# Patient Record
Sex: Male | Born: 1955 | Race: White | Hispanic: No | Marital: Married | Smoking: Former smoker
Health system: Southern US, Community
[De-identification: ages and names within clinical notes are randomized; demographics above are authoritative.]

## PROBLEM LIST (undated history)

## (undated) DIAGNOSIS — Z950 Presence of cardiac pacemaker: Secondary | ICD-10-CM

## (undated) DIAGNOSIS — I714 Abdominal aortic aneurysm, without rupture, unspecified: Secondary | ICD-10-CM

## (undated) DIAGNOSIS — E785 Hyperlipidemia, unspecified: Secondary | ICD-10-CM

## (undated) DIAGNOSIS — Z95810 Presence of automatic (implantable) cardiac defibrillator: Secondary | ICD-10-CM

## (undated) DIAGNOSIS — Q2112 Patent foramen ovale: Secondary | ICD-10-CM

## (undated) DIAGNOSIS — I10 Essential (primary) hypertension: Secondary | ICD-10-CM

## (undated) DIAGNOSIS — I2510 Atherosclerotic heart disease of native coronary artery without angina pectoris: Secondary | ICD-10-CM

## (undated) DIAGNOSIS — G4730 Sleep apnea, unspecified: Secondary | ICD-10-CM

## (undated) DIAGNOSIS — Z951 Presence of aortocoronary bypass graft: Secondary | ICD-10-CM

## (undated) DIAGNOSIS — I509 Heart failure, unspecified: Secondary | ICD-10-CM

## (undated) DIAGNOSIS — I739 Peripheral vascular disease, unspecified: Secondary | ICD-10-CM

## (undated) DIAGNOSIS — I219 Acute myocardial infarction, unspecified: Secondary | ICD-10-CM

## (undated) DIAGNOSIS — Q211 Atrial septal defect: Secondary | ICD-10-CM

## (undated) DIAGNOSIS — I639 Cerebral infarction, unspecified: Secondary | ICD-10-CM

## (undated) DIAGNOSIS — Z9049 Acquired absence of other specified parts of digestive tract: Secondary | ICD-10-CM

## (undated) DIAGNOSIS — K219 Gastro-esophageal reflux disease without esophagitis: Secondary | ICD-10-CM

## (undated) DIAGNOSIS — J189 Pneumonia, unspecified organism: Secondary | ICD-10-CM

## (undated) DIAGNOSIS — G709 Myoneural disorder, unspecified: Secondary | ICD-10-CM

## (undated) HISTORY — PX: CARDIAC DEFIBRILLATOR PLACEMENT: SHX171

## (undated) HISTORY — PX: ANKLE SURGERY: SHX546

## (undated) HISTORY — PX: FRACTURE SURGERY: SHX138

## (undated) HISTORY — DX: Atherosclerotic heart disease of native coronary artery without angina pectoris: I25.10

## (undated) HISTORY — DX: Acquired absence of other specified parts of digestive tract: Z90.49

## (undated) HISTORY — PX: CORONARY ANGIOPLASTY: SHX604

## (undated) HISTORY — PX: CARDIAC CATHETERIZATION: SHX172

## (undated) HISTORY — PX: HERNIA REPAIR: SHX51

## (undated) HISTORY — DX: Abdominal aortic aneurysm, without rupture: I71.4

## (undated) HISTORY — PX: APPENDECTOMY: SHX54

## (undated) HISTORY — DX: Peripheral vascular disease, unspecified: I73.9

## (undated) HISTORY — DX: Patent foramen ovale: Q21.12

## (undated) HISTORY — DX: Abdominal aortic aneurysm, without rupture, unspecified: I71.40

## (undated) HISTORY — DX: Gastro-esophageal reflux disease without esophagitis: K21.9

## (undated) HISTORY — PX: PTCA: SHX146

## (undated) HISTORY — PX: INSERT / REPLACE / REMOVE PACEMAKER: SUR710

## (undated) HISTORY — DX: Hyperlipidemia, unspecified: E78.5

## (undated) HISTORY — DX: Presence of aortocoronary bypass graft: Z95.1

## (undated) HISTORY — DX: Atrial septal defect: Q21.1

## (undated) HISTORY — DX: Essential (primary) hypertension: I10

---

## 1997-08-04 ENCOUNTER — Encounter: Admission: RE | Admit: 1997-08-04 | Discharge: 1997-08-04 | Payer: Self-pay | Admitting: *Deleted

## 1997-08-04 ENCOUNTER — Emergency Department (HOSPITAL_COMMUNITY): Admission: EM | Admit: 1997-08-04 | Discharge: 1997-08-04 | Payer: Self-pay | Admitting: Internal Medicine

## 1997-08-07 ENCOUNTER — Encounter: Admission: RE | Admit: 1997-08-07 | Discharge: 1997-11-05 | Payer: Self-pay | Admitting: Internal Medicine

## 1998-04-05 ENCOUNTER — Encounter: Payer: Self-pay | Admitting: Emergency Medicine

## 1998-04-05 ENCOUNTER — Inpatient Hospital Stay (HOSPITAL_COMMUNITY): Admission: EM | Admit: 1998-04-05 | Discharge: 1998-04-06 | Payer: Self-pay | Admitting: Emergency Medicine

## 1999-04-23 ENCOUNTER — Encounter: Payer: Self-pay | Admitting: Emergency Medicine

## 1999-04-23 ENCOUNTER — Inpatient Hospital Stay (HOSPITAL_COMMUNITY): Admission: EM | Admit: 1999-04-23 | Discharge: 1999-04-24 | Payer: Self-pay | Admitting: Emergency Medicine

## 1999-09-03 ENCOUNTER — Ambulatory Visit (HOSPITAL_BASED_OUTPATIENT_CLINIC_OR_DEPARTMENT_OTHER): Admission: RE | Admit: 1999-09-03 | Discharge: 1999-09-03 | Payer: Self-pay | Admitting: Pulmonary Disease

## 1999-12-17 ENCOUNTER — Encounter: Payer: Self-pay | Admitting: Occupational Medicine

## 1999-12-17 ENCOUNTER — Encounter: Admission: RE | Admit: 1999-12-17 | Discharge: 1999-12-17 | Payer: Self-pay | Admitting: Internal Medicine

## 2000-07-24 ENCOUNTER — Ambulatory Visit (HOSPITAL_COMMUNITY): Admission: RE | Admit: 2000-07-24 | Discharge: 2000-07-25 | Payer: Self-pay | Admitting: Cardiology

## 2001-05-12 ENCOUNTER — Encounter: Admission: RE | Admit: 2001-05-12 | Discharge: 2001-05-12 | Payer: Self-pay | Admitting: Occupational Medicine

## 2001-05-12 ENCOUNTER — Encounter: Payer: Self-pay | Admitting: Occupational Medicine

## 2002-01-30 ENCOUNTER — Emergency Department (HOSPITAL_COMMUNITY): Admission: EM | Admit: 2002-01-30 | Discharge: 2002-01-30 | Payer: Self-pay | Admitting: Emergency Medicine

## 2002-11-23 ENCOUNTER — Encounter (INDEPENDENT_AMBULATORY_CARE_PROVIDER_SITE_OTHER): Payer: Self-pay | Admitting: Specialist

## 2002-11-23 ENCOUNTER — Encounter: Payer: Self-pay | Admitting: Surgery

## 2002-11-23 ENCOUNTER — Inpatient Hospital Stay (HOSPITAL_COMMUNITY): Admission: EM | Admit: 2002-11-23 | Discharge: 2002-11-25 | Payer: Self-pay | Admitting: Surgery

## 2002-11-24 ENCOUNTER — Encounter: Payer: Self-pay | Admitting: Surgery

## 2002-11-24 ENCOUNTER — Encounter: Payer: Self-pay | Admitting: Internal Medicine

## 2002-12-02 ENCOUNTER — Encounter: Admission: RE | Admit: 2002-12-02 | Discharge: 2002-12-02 | Payer: Self-pay | Admitting: Surgery

## 2002-12-02 ENCOUNTER — Encounter: Payer: Self-pay | Admitting: Surgery

## 2002-12-02 ENCOUNTER — Inpatient Hospital Stay (HOSPITAL_COMMUNITY): Admission: EM | Admit: 2002-12-02 | Discharge: 2002-12-08 | Payer: Self-pay | Admitting: Surgery

## 2002-12-02 HISTORY — PX: CHOLECYSTECTOMY: SHX55

## 2002-12-05 ENCOUNTER — Encounter: Payer: Self-pay | Admitting: Surgery

## 2006-03-12 ENCOUNTER — Emergency Department (HOSPITAL_COMMUNITY): Admission: EM | Admit: 2006-03-12 | Discharge: 2006-03-12 | Payer: Self-pay | Admitting: Emergency Medicine

## 2006-03-14 ENCOUNTER — Inpatient Hospital Stay (HOSPITAL_COMMUNITY): Admission: EM | Admit: 2006-03-14 | Discharge: 2006-03-17 | Payer: Self-pay | Admitting: Emergency Medicine

## 2006-03-19 ENCOUNTER — Ambulatory Visit: Payer: Self-pay | Admitting: Gastroenterology

## 2007-11-09 ENCOUNTER — Inpatient Hospital Stay (HOSPITAL_COMMUNITY): Admission: AD | Admit: 2007-11-09 | Discharge: 2007-11-11 | Payer: Self-pay | Admitting: Orthopedic Surgery

## 2007-11-09 ENCOUNTER — Encounter (INDEPENDENT_AMBULATORY_CARE_PROVIDER_SITE_OTHER): Payer: Self-pay | Admitting: Orthopedic Surgery

## 2008-01-26 ENCOUNTER — Ambulatory Visit: Payer: Self-pay | Admitting: Cardiology

## 2008-02-03 ENCOUNTER — Ambulatory Visit: Payer: Self-pay | Admitting: Cardiovascular Disease

## 2008-02-04 ENCOUNTER — Inpatient Hospital Stay (HOSPITAL_COMMUNITY): Admission: EM | Admit: 2008-02-04 | Discharge: 2008-02-05 | Payer: Self-pay | Admitting: *Deleted

## 2008-02-04 ENCOUNTER — Encounter (INDEPENDENT_AMBULATORY_CARE_PROVIDER_SITE_OTHER): Payer: Self-pay | Admitting: Neurology

## 2008-02-08 ENCOUNTER — Ambulatory Visit: Payer: Self-pay | Admitting: Cardiology

## 2008-02-08 ENCOUNTER — Ambulatory Visit: Payer: Self-pay

## 2008-02-08 LAB — CONVERTED CEMR LAB
Alkaline Phosphatase: 73 units/L (ref 39–117)
CO2: 28 meq/L (ref 19–32)
Calcium: 9 mg/dL (ref 8.4–10.5)
GFR calc Af Amer: 102 mL/min
GFR calc non Af Amer: 84 mL/min
Potassium: 4.5 meq/L (ref 3.5–5.1)
Total Bilirubin: 0.7 mg/dL (ref 0.3–1.2)
Total Protein: 6.4 g/dL (ref 6.0–8.3)

## 2008-03-30 ENCOUNTER — Ambulatory Visit: Payer: Self-pay | Admitting: Cardiology

## 2008-03-30 LAB — CONVERTED CEMR LAB
Alkaline Phosphatase: 62 units/L (ref 39–117)
Bilirubin, Direct: 0.2 mg/dL (ref 0.0–0.3)
Cholesterol: 161 mg/dL (ref 0–200)
Total Bilirubin: 0.8 mg/dL (ref 0.3–1.2)
Total Protein: 6.5 g/dL (ref 6.0–8.3)
Triglycerides: 159 mg/dL — ABNORMAL HIGH (ref 0–149)

## 2008-05-17 DIAGNOSIS — K219 Gastro-esophageal reflux disease without esophagitis: Secondary | ICD-10-CM

## 2008-05-17 DIAGNOSIS — E785 Hyperlipidemia, unspecified: Secondary | ICD-10-CM | POA: Insufficient documentation

## 2008-05-17 DIAGNOSIS — I2510 Atherosclerotic heart disease of native coronary artery without angina pectoris: Secondary | ICD-10-CM

## 2008-05-17 DIAGNOSIS — I10 Essential (primary) hypertension: Secondary | ICD-10-CM

## 2008-05-18 ENCOUNTER — Ambulatory Visit: Payer: Self-pay | Admitting: Cardiology

## 2008-05-18 ENCOUNTER — Encounter: Payer: Self-pay | Admitting: Cardiology

## 2008-05-18 DIAGNOSIS — J209 Acute bronchitis, unspecified: Secondary | ICD-10-CM

## 2008-05-18 DIAGNOSIS — I2589 Other forms of chronic ischemic heart disease: Secondary | ICD-10-CM | POA: Insufficient documentation

## 2008-06-01 ENCOUNTER — Encounter: Payer: Self-pay | Admitting: Internal Medicine

## 2008-06-01 ENCOUNTER — Ambulatory Visit: Payer: Self-pay

## 2008-06-19 ENCOUNTER — Telehealth: Payer: Self-pay | Admitting: Cardiology

## 2009-01-26 ENCOUNTER — Telehealth (INDEPENDENT_AMBULATORY_CARE_PROVIDER_SITE_OTHER): Payer: Self-pay | Admitting: *Deleted

## 2009-02-12 ENCOUNTER — Telehealth (INDEPENDENT_AMBULATORY_CARE_PROVIDER_SITE_OTHER): Payer: Self-pay | Admitting: *Deleted

## 2009-05-02 ENCOUNTER — Encounter: Payer: Self-pay | Admitting: Cardiology

## 2009-06-12 ENCOUNTER — Telehealth (INDEPENDENT_AMBULATORY_CARE_PROVIDER_SITE_OTHER): Payer: Self-pay | Admitting: *Deleted

## 2009-10-15 ENCOUNTER — Ambulatory Visit: Payer: Self-pay | Admitting: Cardiovascular Disease

## 2009-10-15 ENCOUNTER — Inpatient Hospital Stay (HOSPITAL_COMMUNITY)
Admission: EM | Admit: 2009-10-15 | Discharge: 2009-10-23 | Payer: Self-pay | Source: Home / Self Care | Admitting: Emergency Medicine

## 2009-10-17 ENCOUNTER — Encounter (INDEPENDENT_AMBULATORY_CARE_PROVIDER_SITE_OTHER): Payer: Self-pay | Admitting: Internal Medicine

## 2009-10-18 ENCOUNTER — Encounter: Payer: Self-pay | Admitting: Cardiology

## 2009-10-19 ENCOUNTER — Ambulatory Visit: Payer: Self-pay | Admitting: Physical Medicine & Rehabilitation

## 2009-10-19 ENCOUNTER — Ambulatory Visit: Payer: Self-pay | Admitting: Vascular Surgery

## 2009-10-31 ENCOUNTER — Encounter: Payer: Self-pay | Admitting: Cardiology

## 2009-11-01 ENCOUNTER — Ambulatory Visit: Payer: Self-pay | Admitting: Cardiology

## 2009-11-01 DIAGNOSIS — I6350 Cerebral infarction due to unspecified occlusion or stenosis of unspecified cerebral artery: Secondary | ICD-10-CM | POA: Insufficient documentation

## 2009-11-20 ENCOUNTER — Telehealth: Payer: Self-pay | Admitting: Cardiology

## 2009-12-31 ENCOUNTER — Telehealth (INDEPENDENT_AMBULATORY_CARE_PROVIDER_SITE_OTHER): Payer: Self-pay | Admitting: *Deleted

## 2009-12-31 ENCOUNTER — Ambulatory Visit: Payer: Self-pay | Admitting: Cardiology

## 2010-01-29 ENCOUNTER — Ambulatory Visit: Payer: Self-pay | Admitting: Cardiology

## 2010-02-05 ENCOUNTER — Ambulatory Visit: Payer: Self-pay | Admitting: Cardiology

## 2010-02-05 ENCOUNTER — Encounter: Payer: Self-pay | Admitting: Cardiology

## 2010-02-06 ENCOUNTER — Encounter: Payer: Self-pay | Admitting: Cardiology

## 2010-02-06 ENCOUNTER — Encounter (INDEPENDENT_AMBULATORY_CARE_PROVIDER_SITE_OTHER): Payer: Self-pay | Admitting: *Deleted

## 2010-02-06 ENCOUNTER — Telehealth: Payer: Self-pay | Admitting: Cardiology

## 2010-02-06 LAB — CONVERTED CEMR LAB
Basophils Absolute: 0 10*3/uL (ref 0.0–0.1)
Basophils Relative: 0.2 % (ref 0.0–3.0)
CO2: 27 meq/L (ref 19–32)
Calcium: 9.3 mg/dL (ref 8.4–10.5)
Chloride: 107 meq/L (ref 96–112)
Creatinine, Ser: 1.1 mg/dL (ref 0.4–1.5)
Glucose, Bld: 72 mg/dL (ref 70–99)
HCT: 42.9 % (ref 39.0–52.0)
Hemoglobin: 14.5 g/dL (ref 13.0–17.0)
INR: 1.1 — ABNORMAL HIGH (ref 0.8–1.0)
Lymphocytes Relative: 18.2 % (ref 12.0–46.0)
Lymphs Abs: 1.6 10*3/uL (ref 0.7–4.0)
MCHC: 33.8 g/dL (ref 30.0–36.0)
MCV: 89.5 fL (ref 78.0–100.0)
Monocytes Absolute: 0.5 10*3/uL (ref 0.1–1.0)
Potassium: 5.1 meq/L (ref 3.5–5.1)
Prothrombin Time: 11.9 s — ABNORMAL HIGH (ref 9.7–11.8)
RDW: 14.9 % — ABNORMAL HIGH (ref 11.5–14.6)
Sodium: 140 meq/L (ref 135–145)
WBC: 8.8 10*3/uL (ref 4.5–10.5)
aPTT: 31.7 s — ABNORMAL HIGH (ref 21.7–28.8)

## 2010-02-12 ENCOUNTER — Ambulatory Visit (HOSPITAL_COMMUNITY)
Admission: RE | Admit: 2010-02-12 | Discharge: 2010-02-12 | Payer: Self-pay | Source: Home / Self Care | Attending: Cardiovascular Disease | Admitting: Cardiovascular Disease

## 2010-02-14 ENCOUNTER — Telehealth (INDEPENDENT_AMBULATORY_CARE_PROVIDER_SITE_OTHER): Payer: Self-pay | Admitting: *Deleted

## 2010-02-15 ENCOUNTER — Ambulatory Visit: Admit: 2010-02-15 | Payer: Self-pay | Admitting: Cardiology

## 2010-02-18 ENCOUNTER — Encounter: Payer: Self-pay | Admitting: Cardiology

## 2010-02-18 ENCOUNTER — Ambulatory Visit: Admission: RE | Admit: 2010-02-18 | Discharge: 2010-02-18 | Payer: Self-pay | Source: Home / Self Care

## 2010-02-18 ENCOUNTER — Ambulatory Visit
Admission: RE | Admit: 2010-02-18 | Discharge: 2010-02-18 | Payer: Self-pay | Source: Home / Self Care | Attending: Cardiology | Admitting: Cardiology

## 2010-02-18 DIAGNOSIS — R0989 Other specified symptoms and signs involving the circulatory and respiratory systems: Secondary | ICD-10-CM | POA: Insufficient documentation

## 2010-02-26 ENCOUNTER — Telehealth: Payer: Self-pay | Admitting: Cardiology

## 2010-02-28 ENCOUNTER — Ambulatory Visit
Admission: RE | Admit: 2010-02-28 | Discharge: 2010-02-28 | Payer: Self-pay | Source: Home / Self Care | Attending: Internal Medicine | Admitting: Internal Medicine

## 2010-03-01 ENCOUNTER — Telehealth: Payer: Self-pay | Admitting: Internal Medicine

## 2010-03-04 ENCOUNTER — Telehealth: Payer: Self-pay | Admitting: Internal Medicine

## 2010-03-11 ENCOUNTER — Encounter
Admission: AD | Admit: 2010-03-11 | Discharge: 2010-03-11 | Payer: Self-pay | Source: Home / Self Care | Attending: Dentistry | Admitting: Dentistry

## 2010-03-12 NOTE — Progress Notes (Signed)
  Recieved Request for Records form DDS forwarded to [facility] for processing [facility]  February 12, 2009 9:09 AM

## 2010-03-12 NOTE — Progress Notes (Signed)
  DDS request received sent to [facility]  December 31, 2009 9:33 AM     Appended Document:  DDS request received sent to [facility]

## 2010-03-12 NOTE — Letter (Signed)
Summary: Insulin Resistance Intervention After Stroke Trial   Insulin Resistance Intervention After Stroke Trial   Imported By: Roderic Ovens 06/06/2009 16:12:22  _____________________________________________________________________  External Attachment:    Type:   Image     Comment:   External Document

## 2010-03-12 NOTE — Assessment & Plan Note (Signed)
Summary: f6w   Visit Type:  Follow-up Primary Provider:  wilson elkins   History of Present Illness: Mr. Treloar is 55 years old and return for a followup visit after his recent hospitalization for a stroke. 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 In 2002 he had a bare-metal stent to the right coronary artery. He was recently admitted with a stroke with symptoms of confusion and ataxia. He had a TEE which showed an ejection fraction of 30-35% with no evident thrombus. 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 His MRI showed multiple posterior circulation infarct.  Since that time he is had a great deal of difficulty with memory and cognition. He's also had some trouble with gait. 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  Current Medications (verified): 1)  Plavix 75 Mg Tabs (Clopidogrel Bisulfate) .... Take One Tablet By Mouth Daily 2)  Simvastatin 40 Mg Tabs (Simvastatin) .... Take One Tablet By Mouth Daily At Bedtime 3)  Nitroglycerin 0.4 Mg Subl (Nitroglycerin) .... One Tablet Under Tongue Every 5 Minutes As Needed For Chest Pain---May Repeat Times Three 4)  Lotensin 10 Mg Tabs (Benazepril Hcl) .... Take 1 Tablet By Mouth Once A Day 5)  Toprol Xl 50 Mg Xr24h-Tab (Metoprolol Succinate) .... Take 1 Tablet Once A Day 6)  Gabapentin 300 Mg Caps (Gabapentin) .... Once Daily 7)  Amitriptyline Hcl 50 Mg Tabs (Amitriptyline Hcl) .... Uad 8)  Iris Study .... Uad  Allergies (verified): No Known Drug Allergies  Past History:  Past Medical History: Reviewed history from 05/17/2008 and no changes required. CAD, UNSPECIFIED SITE (ICD-414.00) HYPERTENSION, UNSPECIFIED (ICD-401.9) HYPERLIPIDEMIA-MIXED (ICD-272.4) GERD (ICD-530.81)   1. 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 3. Hyperlipidemia.  Intolerant to MULTIPLE CHOLESTEROL MEDICATIONS. 4. Hypertension.  5. Gastroesophageal reflux disease 6. Status post cholecystectomy. 7. Continued tobacco use.   Review of Systems       ROS is negative except as outlined in HPI.   Vital Signs:  Patient profile:   55 year old male Height:      73 inches Weight:      197 pounds BMI:     26.08 Pulse rate:   72 / minute BP sitting:   100 / 80  (left arm)  Vitals Entered By: Laurance Flatten CMA (December 31, 2009 3:11 PM)  Physical Exam  Additional Exam:  Gen. Well-nourished, in no distress   Neck: No JVD, thyroid not enlarged, no carotid bruits Lungs: No tachypnea, clear without rales, rhonchi or wheezes Cardiovascular: Rhythm regular, PMI not displaced,  heart sounds  normal, no murmurs or gallops, no peripheral edema, pulses normal in all 4 extremities. Abdomen: BS normal, abdomen soft and non-tender without masses or organomegaly, no hepatosplenomegaly. MS: No deformities, no cyanosis or clubbing   Neuro:  No focal sns   Skin:  no lesions    Impression & Recommendations:  Problem # 1:  CVA (ICD-434.91) He had a recent CVA with multiple posterior circulation infarcts by MRI. He is still having problems with cognition and ataxia. His workup showed LV dysfunction with an ejection fraction of 30-35%. He also had a shunt at atrial level  with a septal aneurysm. He was discharged on Plavix. I plan to discuss with Dr. Andrey Campanile the management of his stroke. Should we consider Coumadin or closure of his patent foramen?  he has been not able to work because of symptoms from his stroke. His updated medication list for this problem includes:    Plavix 75 Mg Tabs (Clopidogrel bisulfate) .[name]... Take one tablet by mouth daily  Problem # 2:  CAD, UNSPECIFIED SITE (ICD-414.00) He had a remote inferior MI and has had a PTCA and then a stent in 2002. He's had no chest  pain this problem appears stable. His updated medication list for this problem includes:    Plavix 75 Mg Tabs (Clopidogrel bisulfate) .[name]... Take one tablet by mouth daily    Nitroglycerin 0.4 Mg Subl (Nitroglycerin) ..... One tablet under tongue every 5 minutes as needed for chest pain---may repeat times three    Lotensin 10 Mg Tabs (Benazepril hcl) .[name]... Take 1 tablet by mouth once a day    Toprol Xl 50 Mg Xr24h-tab (Metoprolol succinate) .[name]... Take 1 tablet once a day  Problem # 3:  CARDIOMYOPATHY, ISCHEMIC (ICD-414.8) He has a new cardiomyopathy and the etiology is not clear. I think he should have further evaluation with a catheterization to define the problem but right now he has no insurance. He is hopeful that he will qualify for Medicare early next month and will plan to see him back in one month and reevaluate this. His updated medication list for this problem includes:    Plavix 75 Mg Tabs (Clopidogrel bisulfate) .[name]... Take one tablet by mouth daily    Nitroglycerin 0.4 Mg Subl (Nitroglycerin) ..... One tablet under tongue every 5 minutes as needed for chest pain---may repeat times three    Lotensin 10 Mg Tabs (Benazepril hcl) .[name]... Take 1 tablet by mouth once a day    Toprol Xl 50 Mg Xr24h-tab (Metoprolol succinate) .[name]... Take 1 tablet once a day  Patient Instructions: 1)  Your physician recommends that you schedule a follow-up appointment in: 4 weeks. 2)  Your physician recommends that you continue on your current medications as directed. Please refer to the Current Medication list given to you today.

## 2010-03-12 NOTE — Miscellaneous (Signed)
  Clinical Lists Changes  Observations: Added new observation of ECHOINTERP: 1. Left ventricle: The cavity size was moderately dilated. Wall    thickness was normal. Systolic function was moderately reduced.    The estimated ejection fraction was in the range of 35% to 40%.    Moderate diffuse hypokinesis. 2. Left atrium: The atrium was mildly dilated. 3. Atrial septum: No defect or patent foramen ovale was identified.  (06/01/2009 9:57)      Echocardiogram  Procedure date:  06/01/2009  Findings:      1. Left ventricle: The cavity size was moderately dilated. Wall    thickness was normal. Systolic function was moderately reduced.    The estimated ejection fraction was in the range of 35% to 40%.    Moderate diffuse hypokinesis. 2. Left atrium: The atrium was mildly dilated. 3. Atrial septum: No defect or patent foramen ovale was identified.

## 2010-03-12 NOTE — Progress Notes (Signed)
Summary: Plavix- [facility]  Phone Note Outgoing Call   Call placed by: Sherri Rad, RN, BSN,  November 20, 2009 8:13 AM Call placed to: Patient Summary of Call: I called and made the pt aware his Plavix has arrived from [facility].  Initial call taken by: Sherri Rad, RN, BSN,  November 20, 2009 8:14 AM

## 2010-03-12 NOTE — Assessment & Plan Note (Signed)
Summary: U4Q   Primary [name]:  [name]  CC:  dizziness and sob.  History of Present Illness: The patient is 55 years old and returned for followup management of CAD and LV dysfunction after his recent admission for stroke. He was admitted to [facility] with a stroke. This did not show up on CT but did show up as multiple posterior circulation strokes by MRI. This is manifested primarily by speech disturbance and gait disturbance. The speech disturbance is mostly cleared.  He had a TEE by Dr. Jens Som in the hospital which showed an ejection fraction of 30-35% and showed a patent foramen. He was discharged on Plavix.  Since discharge he has had no chest pain or shortness of breath. His main prominent than with his gait.  Current Medications (verified): 1)  Plavix 75 Mg Tabs (Clopidogrel Bisulfate) .... Take One Tablet By Mouth Daily 2)  Simvastatin 40 Mg Tabs (Simvastatin) .... Take One Tablet By Mouth Daily At Bedtime 3)  Zantac 150 Mg Tabs (Ranitidine Hcl) .[name].. 1 Tab By Mouth Once Daily 4)  Nitroglycerin 0.4 Mg Subl (Nitroglycerin) .... One Tablet Under Tongue Every 5 Minutes As Needed For Chest Pain---May Repeat Times Three 5)  Lotensin 10 Mg Tabs (Benazepril Hcl) .... Take 1 Tablet By Mouth Once A Day 6)  Toprol Xl 50 Mg Xr24h-Tab (Metoprolol Succinate) .... Take 1 Tablet Once A Day  Allergies (verified): No Known Drug Allergies  Past History:  Past Surgical History: Last updated: 05/17/2008 Cholecystectomy...12/02/02 ERCP and Upper endoscopy..11/24/02 s/p PTCA stent of the RCA..07/24/00 Hernia repair Appendectomy Left ankle pinning  Family History: Last updated: 05/17/2008 Family History of Coronary Artery Disease: .[name] Fatther had bypass surgery at 72..died in his 66's  Social History: Last updated: 05/18/2008 Married  Tobacco Use - Yes.  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  Past Medical  History: Reviewed history from 05/17/2008 and no changes required. CAD, UNSPECIFIED SITE (ICD-414.00) HYPERTENSION, UNSPECIFIED (ICD-401.9) HYPERLIPIDEMIA-MIXED (ICD-272.4) GERD (ICD-530.81)   1. 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 3. Hyperlipidemia.  Intolerant to MULTIPLE CHOLESTEROL MEDICATIONS. 4. Hypertension.  5. Gastroesophageal reflux disease 6. Status post cholecystectomy. 7. Continued tobacco use.   Review of Systems       ROS is negative except as outlined in HPI.   Vital Signs:  Patient profile:   55 year old male Height:      73 inches Weight:      194 pounds BMI:     25.69 Pulse rate:   72 / minute Pulse (ortho):   73 / minute Resp:     14 per minute BP sitting:   120 / 78  (right arm) BP standing:   105 / 70  Vitals Entered By: Kem Parkinson (November 01, 2009 11:04 AM)  Serial Vital Signs/Assessments:  Time      Position  BP       Pulse  Resp  Temp     By           Lying RA  120/78   69                    Kimalexis Barnes  Sitting   105/73   71                    Kimalexis Barnes           Standing  105/70   73                    Kimalexis Barnes   Physical Exam  Additional Exam:  Gen. Well-nourished, in no distress   Neck: No JVD, thyroid not enlarged, no carotid bruits Lungs: No tachypnea, clear without rales, rhonchi or wheezes Cardiovascular: Rhythm regular, PMI not displaced,  heart sounds  normal, no murmurs or gallops, no peripheral edema, pulses normal in all 4 extremities. Abdomen: BS normal, abdomen soft and non-tender without masses or organomegaly, no hepatosplenomegaly. MS: No deformities, no cyanosis or clubbing   Neuro:  No focal sns, ataxic   Skin:  no lesions    Impression & Recommendations:  Problem # 1:  CVA (ICD-434.91) He was recently  hospitalized for an acute stroke. MRI showed multiple posterior circulation strokes thought to probably be embolic. He has severe LV dysfunction and a patent foramen. His carotids were okay. It is not clear to stroke is related to his foramen or LV dysfunction. He was discharged on Plavix. He does have financial problems and will need assistance for Plavix. His updated medication list for this problem includes:    Plavix 75 Mg Tabs (Clopidogrel bisulfate) .[name]... Take one tablet by mouth daily  Problem # 2:  CARDIOMYOPATHY, ISCHEMIC (ICD-414.8) His ejection fraction was 30-35% by TEE which is worse. This may be a factor ANA possible cardiogenic source for emboli. The reason for his worse L. dysfunction is not clear but we may want to evaluate him with cardiac catheterization after he has recovered from his stroke in 6 weeks. His updated medication list for this problem includes:    Plavix 75 Mg Tabs (Clopidogrel bisulfate) .[name]... Take one tablet by mouth daily    Nitroglycerin 0.4 Mg Subl (Nitroglycerin) ..... One tablet under tongue every 5 minutes as needed for chest pain---may repeat times three    Lotensin 10 Mg Tabs (Benazepril hcl) .[name]... Take 1 tablet by mouth once a day    Toprol Xl 50 Mg Xr24h-tab (Metoprolol succinate) .[name]... Take 1 tablet once a day  Problem # 3:  CAD, UNSPECIFIED SITE (ICD-414.00)  He had a previous DMI in 1995 treated with PTCA. In 2002 he had a stent to the RCA. His ejection fraction by echo in 2009 was 35-40%. He says he has stopped smoking since his stroke. His updated medication list for this problem includes:    Plavix 75 Mg Tabs (Clopidogrel bisulfate) .[name]... Take one tablet by mouth daily    Nitroglycerin 0.4 Mg Subl (Nitroglycerin) ..... One tablet under tongue every 5 minutes as needed for chest pain---may repeat times three    Lotensin 10 Mg Tabs (Benazepril hcl) .[name]... Take 1 tablet by mouth once a day    Toprol Xl 50 Mg Xr24h-tab (Metoprolol succinate) .[name]... Take  1 tablet once a day  Orders: EKG w/ Interpretation (93000)  Problem # 4:  HYPERTENSION, UNSPECIFIED (ICD-401.9) This is well controlled on current medications. His updated medication list for this problem includes:    Lotensin 10 Mg Tabs (Benazepril hcl) .[name]... Take 1 tablet by mouth once a day    Toprol Xl 50 Mg Xr24h-tab (Metoprolol succinate) .[name]... Take 1 tablet once a day  Patient Instructions: 1)  Your physician  recommends that you schedule a follow-up appointment in: 6 weeks. 2)  Your physician recommends that you continue on your current medications as directed. Please refer to the Current Medication list given to you today. Prescriptions: NITROGLYCERIN 0.4 MG SUBL (NITROGLYCERIN) One tablet under tongue every 5 minutes as needed for chest pain---may repeat times three  #25 x 3   Entered by:   Sherri Rad, RN, BSN   Authorized by:   Lenoria Farrier, MD, [facility]   Signed by:   Sherri Rad, RN, BSN on 11/01/2009   Method used:   Electronically to        [facility]* (retail)       [address] [place], [place]  [address]       Ph: [phone] or [phone]       Fax: [phone]   RxID:   [id number]

## 2010-03-12 NOTE — Progress Notes (Signed)
  DDS request recieved sent to [facility]  [facility]  Jun 12, 2009 3:24 PM    Appended Document:  Recieved Request for DDS sent to [facility]

## 2010-03-14 NOTE — Letter (Signed)
Summary: Cardiac Catheterization Instructions- Main Lab  [facility], Main Office  [address]   [place], [place] [address]   Phone: [phone]  Fax: [phone]     02/06/2010 MRN: [id number]  [facility] [address] [place], [place]  [address]  Dear Mr. MCKEITHAN,   You are scheduled for Cardiac Catheterization on Tues 02/12/09             with Dr. Lonzo Candy.  Please arrive at the [facility] at 8:30      a.m. on the day of your procedure.  1. DIET     __x__ Nothing to eat or drink after midnight except your medications with a sip of water.  2. Come to the [place] office on  ( 02/05/10 )           for lab work.  The lab at [facility] is open from 8:30 a.m. to 1:30 p.m. and 2:30 p.m. to 5:00 p.m.  The lab at [facility] is open from 7:30 a.m. to 5:30 p.m.  You do not have to be fasting.  3. MAKE SURE YOU TAKE YOUR ASPIRIN.  4. _____ DO NOT TAKE these medications before your procedure:         ________________________________________________________________________________      __x__ YOU MAY TAKE ALL of your remaining medications with a small amount of water.      ____ START NEW medications:     ________________________________________________________________________________      ____ Eilene Ghazi instructions:     ________________________________________________________________________________  5. Plan for one night stay - bring personal belongings (i.e. toothpaste, toothbrush, etc.)  6. Bring a current list of your medications and current insurance cards.  7. Must have a responsible person to drive you home.   8. Someone must be with you for the first 24 hours after you arrive home.  9. Please wear clothes that are easy to get on and off and wear slip-on shoes.  *Special note: Every effort is made to have your procedure done on time.  Occasionally there are emergencies that present themselves at the  hospital that may cause delays.  Please be patient if a delay does occur.  If you have any questions after you get home, please call the office at the number listed above.  Sherri Rad, RN, BSN

## 2010-03-14 NOTE — Assessment & Plan Note (Signed)
Summary: DICUSS D-FIB/SL   Visit Type:  Follow-up Primary Provider:  wilson elkins   History of Present Illness: [name] is referred today by Dr. Mina Marble for consideration for ICD implant. He is a pleasant 55 yo man with a h/o an ICM, s/p MI, CHF and a prior stroke.  He has class 2 symptoms. He has never had syncope or documented sustained  VT.   Current Medications (verified): 1)  Plavix 75 Mg Tabs (Clopidogrel Bisulfate) .... Take One Tablet By Mouth Daily 2)  Simvastatin 40 Mg Tabs (Simvastatin) .... Take One Tablet By Mouth Daily At Bedtime 3)  Nitroglycerin 0.4 Mg Subl (Nitroglycerin) .... One Tablet Under Tongue Every 5 Minutes As Needed For Chest Pain---May Repeat Times Three 4)  Lotensin 10 Mg Tabs (Benazepril Hcl) .... Take 1 Tablet By Mouth Once A Day 5)  Toprol Xl 50 Mg Xr24h-Tab (Metoprolol Succinate) .... Take 1 Tablet Once A Day 6)  Gabapentin 300 Mg Caps (Gabapentin) .... As Needed 7)  Amitriptyline Hcl 25 Mg Tabs (Amitriptyline Hcl) .... As Needed 8)  Iris Study .... Uad 9)  Aspirin 81 Mg Tbec (Aspirin) .... Take One Tablet By Mouth Daily 10)  Clindamycin Hcl 300 Mg Caps (Clindamycin Hcl) .... Three Times A Day  Allergies (verified): No Known Drug Allergies  Past History:  Past Medical History: Last updated: 02/18/2010 1. 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 3. Hyperlipidemia.  Intolerant to MULTIPLE CHOLESTEROL MEDICATIONS. 4. Hypertension.  5. Gastroesophageal reflux disease 6. Status post cholecystectomy. 7. CVA 8. PFO  Past Surgical History: Last updated: 05/17/2008 Cholecystectomy...12/02/02 ERCP and Upper endoscopy..11/24/02 s/p PTCA stent of the RCA..07/24/00 Hernia repair Appendectomy Left ankle pinning  Family History: Last updated: 05/17/2008 Family History of  Coronary Artery Disease: .[name] Fatther had bypass surgery at 52..died in his 44's  Social History: Last updated: 05/18/2008 Married  Tobacco Use - Yes.  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  Review of Systems       All systems reviewed and negative except as noted in the HPI.  Vital Signs:  Patient profile:   55 year old male Height:      73 inches Weight:      203 pounds BMI:     26.88 Pulse rate:   76 / minute BP sitting:   112 / 76  (left arm)  Vitals Entered By: Laurance Flatten CMA (February 28, 2010 2:56 PM)  Physical Exam  General:  Well-developed well-nourished in no acute distress.  Skin is warm and dry.  HEENT is normal.  Neck is supple. No thyromegaly.  Chest is clear to auscultation with normal expansion.  Cardiovascular exam is regular rate and rhythm.  Abdominal exam nontender or distended. No masses palpated. Right femoral bruit cath site Extremities show no edema. neuro residual right-sided weakness from previous CVA.    Impression & Recommendations:  Problem # 1:  CARDIOMYOPATHY, ISCHEMIC (ICD-414.8) His EF is 30%. His symptoms are class 2.  I have discussed the indications, risks, benefits, goals and expectations of ICD implant with the patient. He wishes to proceed.  He is not a candidate for a BiV ICD as his QRS duration is less than 120 ms. His updated medication list for  this problem includes:    Plavix 75 Mg Tabs (Clopidogrel bisulfate) .[name]... Take one tablet by mouth daily    Nitroglycerin 0.4 Mg Subl (Nitroglycerin) ..... One tablet under tongue every 5 minutes as needed for chest pain---may repeat times three    Lotensin 10 Mg Tabs (Benazepril hcl) .[name]... Take 1 tablet by mouth once a day    Toprol Xl 50 Mg Xr24h-tab (Metoprolol succinate) .[name]... Take 1 tablet once a day    Aspirin 81 Mg Tbec (Aspirin) .[name]... Take one tablet by mouth daily  Problem # 2:  HYPERTENSION, UNSPECIFIED  (ICD-401.9) His blood pressure is well controlled. He will continue his current meds. His updated medication list for this problem includes:    Lotensin 10 Mg Tabs (Benazepril hcl) .[name]... Take 1 tablet by mouth once a day    Toprol Xl 50 Mg Xr24h-tab (Metoprolol succinate) .[name]... Take 1 tablet once a day    Aspirin 81 Mg Tbec (Aspirin) .[name]... Take one tablet by mouth daily  Patient Instructions: 1)  Your physician recommends that you continue on your current medications as directed. Please refer to the Current Medication list given to you today. 2)  Call us when you are ready to schedule an ICD implant.

## 2010-03-14 NOTE — Assessment & Plan Note (Signed)
Summary: rov    Visit Type:  Follow-up Primary Provider:  wilson elkins   History of Present Illness: [name] is 55 years old and return for a followup visit after his recent hospitalization for a stroke. 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 In 2002 he had a bare-metal stent to the right coronary artery. He was recently admitted with a stroke with symptoms of confusion and ataxia. He had a TEE which showed an ejection fraction of 30-35% with no evident thrombus. 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 His MRI showed multiple posterior circulation infarct.  Since that time he is had  difficulty with memory and cognition but this is some better.[name] He's also had some trouble with gait. 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  Current Medications (verified): 1)  Plavix 75 Mg Tabs (Clopidogrel Bisulfate) .... Take One Tablet By Mouth Daily 2)  Simvastatin 40 Mg Tabs (Simvastatin) .... Take One Tablet By Mouth Daily At Bedtime 3)  Nitroglycerin 0.4 Mg Subl (Nitroglycerin) .... One Tablet Under Tongue Every 5 Minutes As Needed For Chest Pain---May Repeat Times Three 4)  Lotensin 10 Mg Tabs (Benazepril Hcl) .... Take 1 Tablet By Mouth Once A Day 5)  Toprol Xl 50 Mg Xr24h-Tab (Metoprolol Succinate) .... Take 1 Tablet Once A Day 6)  Gabapentin 300 Mg Caps (Gabapentin) .... As Needed 7)  Amitriptyline Hcl 25 Mg Tabs (Amitriptyline Hcl) .... As Needed 8)  Iris Study .... Uad  Allergies (verified): No Known Drug Allergies  Past History:  Past Medical History: Reviewed history from 05/17/2008 and no changes required. CAD, UNSPECIFIED SITE (ICD-414.00) HYPERTENSION, UNSPECIFIED (ICD-401.9) HYPERLIPIDEMIA-MIXED (ICD-272.4) GERD (ICD-530.81)   1. 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 3. Hyperlipidemia.  Intolerant to MULTIPLE CHOLESTEROL MEDICATIONS. 4. Hypertension.  5. Gastroesophageal reflux disease 6. Status post cholecystectomy. 7. Continued tobacco use.   Review of Systems       ROS is negative except as outlined in HPI.   Vital Signs:  Patient profile:   55 year old male Height:      73 inches Weight:      200 pounds BMI:     26.48 Pulse rate:   69 / minute BP sitting:   100 / 60  (left arm)  Vitals Entered By: Laurance Flatten CMA (February 05, 2010 3:26 PM)  Physical Exam  Additional Exam:  Gen. Well-nourished, in no distress   Neck: No JVD, thyroid not enlarged, no carotid bruits Lungs: No tachypnea, clear without rales, rhonchi or wheezes Cardiovascular: Rhythm regular, PMI not displaced,  heart sounds  normal, no murmurs or gallops, no peripheral edema, pulses normal in all 4 extremities. Abdomen: BS normal, abdomen soft and non-tender without masses or organomegaly, no hepatosplenomegaly. MS: No deformities, no cyanosis or clubbing   Neuro:  No focal sns   Skin:  no lesions    Impression & Recommendations:  Problem # 1:  CAD, UNSPECIFIED SITE (ICD-414.00) He has CAD with previous PCI procedures. He has a recent fall and his ejection fraction. Of concern this could be related to progressive coronary disease causing an ischemic cardiomyopathy. We will plan to  evaluate him with left heart catheterization which I will schedule with Dr. Excell Seltzer next week. We will plan to do this in the inpatient laboratory for insurance reasons and he prefers a radial approach.  His family knows Dr. Jens Som who will plan followup with Dr. Jens Som in about 2 weeks following his catheterization procedure.  His updated medication list for this problem includes:    Plavix 75 Mg Tabs (Clopidogrel bisulfate) .[name]... Take one tablet by mouth daily    Nitroglycerin 0.4 Mg Subl  (Nitroglycerin) ..... One tablet under tongue every 5 minutes as needed for chest pain---may repeat times three    Lotensin 10 Mg Tabs (Benazepril hcl) .[name]... Take 1 tablet by mouth once a day    Toprol Xl 50 Mg Xr24h-tab (Metoprolol succinate) .[name]... Take 1 tablet once a day  Orders: TLB-BMP (Basic Metabolic Panel-BMET) (80048-METABOL) TLB-CBC Platelet - w/Differential (85025-CBCD) TLB-PT (Protime) (85610-PTP) TLB-PTT (85730-PTTL) Cardiac Catheterization (Cardiac Cath)  Problem # 2:  CARDIOMYOPATHY, ISCHEMIC (ICD-414.8) His recent ejection fraction was 30-35% by TEE. He appears euvolemic today and this problem appears stable but we plan further evaluation as outlined above. If his ejection fraction remains low he may be a candidate for an ICD. His narrow QRS was probably not a candidate for a biventricular pacemaker. His updated medication list for this problem includes:    Plavix 75 Mg Tabs (Clopidogrel bisulfate) .[name]... Take one tablet by mouth daily    Nitroglycerin 0.4 Mg Subl (Nitroglycerin) ..... One tablet under tongue every 5 minutes as needed for chest pain---may repeat times three    Lotensin 10 Mg Tabs (Benazepril hcl) .[name]... Take 1 tablet by mouth once a day    Toprol Xl 50 Mg Xr24h-tab (Metoprolol succinate) .[name]... Take 1 tablet once a day  Problem # 3:  CVA (ICD-434.91) He was hospitalized recently with a stroke causing ataxia and confusion. MRI showed multiple posterior circulation strokes. He had a TEE which showed a right to left shunt at the atrial level and a depressed LV function with an ejection fraction of 30-35%. The initial recommendation for treatment was with Plavix. I wonder if we should consider Coumadin. Dr. Jens Som and Dr. Excell Seltzer can discuss this after his upcoming catheterization procedure. His updated medication list for this problem includes:    Plavix 75 Mg Tabs (Clopidogrel bisulfate) .[name]... Take one tablet by mouth daily  Problem # 4:  HYPERTENSION, UNSPECIFIED  (ICD-401.9) This appears well controlled on current medications. His updated medication list for this problem includes:    Lotensin 10 Mg Tabs (Benazepril hcl) .[name]... Take 1 tablet by mouth once a day    Toprol Xl 50 Mg Xr24h-tab (Metoprolol succinate) .[name]... Take 1 tablet once a day  Patient Instructions: 1)  Labwork today: bmet/cbc/pt/ptt (414.01). 2)  Your physician has requested that you have a cardiac catheterization.  Cardiac catheterization is used to diagnose and/or treat various heart conditions. Doctors may recommend this procedure for a number of different reasons. The most common reason is to evaluate chest pain. Chest pain can be a symptom of coronary artery disease (CAD), and cardiac catheterization can show whether plaque is narrowing or blocking your heart's arteries. This procedure is also used to evaluate the valves, as well as measure the blood flow and oxygen levels in different parts of your heart.  For further information please visit [email or web address]  Please follow instruction sheet, as given. 3)  Your physician recommends that you schedule a follow-up appointment in: 2 weeks with Dr. Jens Som.

## 2010-03-14 NOTE — Progress Notes (Signed)
Summary: Plavix-[facility]  Phone Note Outgoing Call   Call placed by: Sherri Rad, RN, BSN,  February 26, 2010 11:36 AM Call placed to: Patient Summary of Call: Pt aware Plavix has arrived from [facility].  Initial call taken by: Sherri Rad, RN, BSN,  February 26, 2010 11:36 AM

## 2010-03-14 NOTE — Cardiovascular Report (Signed)
Summary: Pre Cath/Percutaneous Orders   Pre Cath/Percutaneous Orders   Imported By: Roderic Ovens 02/26/2010 14:52:12  _____________________________________________________________________  External Attachment:    Type:   Image     Comment:   External Document

## 2010-03-14 NOTE — Progress Notes (Signed)
Summary: question re procedure  Phone Note Call from Patient Call back at Home Phone [phone]   Caller: Patient Reason for Call: Talk to Nurse Summary of Call: pt has question re procedure schedule for tuesday the 02-12-2010 Initial call taken by: Roe Coombs,  February 06, 2010 3:25 PM  Follow-up for Phone Call        I spoke with the pt.  Follow-up by: Sherri Rad, RN, BSN,  February 06, 2010 4:10 PM

## 2010-03-14 NOTE — Progress Notes (Signed)
  Records Request received from [facility], sent to [facility]  February 14, 2010 12:55 PM

## 2010-03-14 NOTE — Progress Notes (Signed)
Summary: re surgery  Phone Note Call from Patient Call back at Home Phone [phone] Call back at [phone]   Caller: Spouse/[place] Reason for Call: Talk to Nurse Summary of Call: pt wife calling re surgery. pt wife states pt can not have surgery done until pt has his tooth out. pt wife states they found a dentist but needs the office to call. pt wife wants to talk to a nurse. Initial call taken by: Roe Coombs,  March 01, 2010 1:10 PM  Follow-up for Phone Call        Needs appointment sch. for an ICD. He needs a tooth pulled first. The dentist Carolan Shiver (she is unsure on how to spell his name) works @ [employer]..? He stated that he would do his oral surgery but we need to call his office since he has never seen the patient. I can't find the number so [place], the patient's spouse, is going to call back with an office number for [place] to call. They are anxious to get this tooth pulled so he can have the ICD implanted. Whitney Maeola Sarah RN  March 01, 2010 1:24 PM  Spoke with receptionist in Dr. Luretha Murphy office. He will be back in the office Monday and she will call our office when she talks to him. I will call and inform the patient of this and forward this info. to Dr. Ladona Ridgel and his RN. Whitney Maeola Sarah RN  March 01, 2010 1:34 PM  Follow-up by: Whitney Maeola Sarah RN,  March 01, 2010 1:26 PM  Additional Follow-up for Phone Call Additional follow up Details #1::        pt's wife calling re with number for dentist [phone]  needs ok from [place] to do the tooth extraction Glynda Jaeger  March 01, 2010 1:31 PM

## 2010-03-14 NOTE — Progress Notes (Signed)
Summary: PT NEEDS TOOTH PULLED PRIOR TO DEFIB IMPLANT  Phone Note Call from Patient Call back at Home Phone [phone]   Caller: Spouse/ [place] Reason for Call: Talk to Nurse, Talk to Doctor Summary of Call: PT NEEDS TO HAVE A TOOTH PULLED PRIOR TO GETTING DEFIB PUT IN SO PLEASE CALL ASAP SO THEY CAN GET THIS SET UP  Initial call taken by: Omer Jack,  March 04, 2010 12:04 PM  Follow-up for Phone Call        # [phone] Dr Jaquita Folds at [facility] will pull tooth under local  Dennis Bast, RN, BSN  March 04, 2010 5:37 PM Called the office and spoke with Tyler Aas at [facility] office.  The pt has not even been st up for an office visit yet. she still has to talk to Dr Kirtland Bouchard and get his approval.  I explained to her that he needs this tooth extracted prior to getting huis ICD implanted .  She is going to talk eith Dr Kirtland Bouchard today and let me know the outcome.  I will fax over Dr Lubertha Basque approval for him to have it done today.  fax  [phone]  I explained to pt that I have sent my information tho them and he is waiting on Dr Charm Rings office. Dennis Bast, RN, BSN  March 05, 2010 9:42 AM

## 2010-03-14 NOTE — Assessment & Plan Note (Signed)
Summary: ec6/previous [name] pt/jml   Primary Provider:  wilson elkins   History of Present Illness: [name] is 55 years old and return for CM, CAD and prior CVA. Previously followed by Dr Juanda Chance. 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 In 2002 he had a bare-metal stent to the right coronary artery. He was admitted with a stroke with symptoms of confusion and ataxia in Sept of 2011. He had a TEE which showed an ejection fraction of 30-35% with no evident thrombus. 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 His MRI showed multiple posterior circulation infarct. Because his LV function was worse he underwent cardiac catheterization in January of 2012. This revealed 25 LM. There is a 50-60% stenosis of the mid-LAD. There is a diagonal branch arising from this area with ostial 60-70% stenosis. There is an ostial 50% stenosis of the circumflex/intermediate noted. There is a patent stent in the RCAmidvessel with 30-40% in-stent restenosis. The proximal and distal vessel both have 50% stenosis noted. The ejection fraction was 35%. Since he was last seen he denies dyspnea on exertion, orthopnea, PND, pedal edema, palpitations, syncope or chest pain. He has some imbalance of his previous stroke.  Current Medications (verified): 1)  Plavix 75 Mg Tabs (Clopidogrel Bisulfate) .... Take One Tablet By Mouth Daily 2)  Simvastatin 40 Mg Tabs (Simvastatin) .... Take One Tablet By Mouth Daily At Bedtime 3)  Nitroglycerin 0.4 Mg Subl (Nitroglycerin) .... One Tablet Under Tongue Every 5 Minutes As Needed For Chest Pain---May Repeat Times Three 4)  Lotensin 10 Mg Tabs (Benazepril Hcl) .... Take 1 Tablet By Mouth Once A Day 5)  Toprol Xl 50 Mg Xr24h-Tab (Metoprolol Succinate) .... Take 1 Tablet Once A Day 6)  Gabapentin 300 Mg Caps (Gabapentin) .... As Needed 7)  Amitriptyline Hcl 25 Mg Tabs (Amitriptyline Hcl) .... As Needed 8)  Iris Study  .... Uad  Allergies (verified): No Known Drug Allergies  Past History:  Past Medical History: 1. 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 3. Hyperlipidemia.  Intolerant to MULTIPLE CHOLESTEROL MEDICATIONS. 4. Hypertension.  5. Gastroesophageal reflux disease 6. Status post cholecystectomy. 7. CVA 8. PFO  Past Surgical History: Reviewed history from 05/17/2008 and no changes required. Cholecystectomy...12/02/02 ERCP and Upper endoscopy..11/24/02 s/p PTCA stent of the RCA..07/24/00 Hernia repair Appendectomy Left ankle pinning  Social History: Reviewed history from 05/18/2008 and no changes required. Married  Tobacco Use - Yes.  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  Review of Systems       no fevers or chills, productive cough, hemoptysis, dysphasia, odynophagia, melena, hematochezia, dysuria, hematuria, rash, seizure activity, orthopnea, PND, pedal edema, claudication. Remaining systems are negative.   Vital Signs:  Patient profile:   55 year old male Weight:      200 pounds Pulse rate:   66 / minute Pulse rhythm:   regular BP sitting:   126 / 60  (left arm) Cuff size:   large  Vitals Entered By: Deliah Goody, RN (February 18, 2010 8:55 AM)  Physical Exam  General:  Well-developed well-nourished in no acute distress.  Skin is warm and  dry.  HEENT is normal.  Neck is supple. No thyromegaly.  Chest is clear to auscultation with normal expansion.  Cardiovascular exam is regular rate and rhythm.  Abdominal exam nontender or distended. No masses palpated. Right femoral bruit cath site Extremities show no edema. neuro residual right-sided weakness from previous CVA.    Impression & Recommendations:  Problem # 1:  CVA  (ICD-434.91) Patient had atrial septal aneurysm and previous TEE and evidence of positive microcavitation study. Continue Plavix and aspirin. Will review with Dr. Excell Seltzer to see if he is candidate for ongoing PFO closure study. His updated medication list for this problem includes:    Plavix 75 Mg Tabs (Clopidogrel bisulfate) .[name]... Take one tablet by mouth daily    Aspirin 81 Mg Tbec (Aspirin) .[name]... Take one tablet by mouth daily  His updated medication list for this problem includes:    Plavix 75 Mg Tabs (Clopidogrel bisulfate) .[name]... Take one tablet by mouth daily    Aspirin 81 Mg Tbec (Aspirin) .[name]... Take one tablet by mouth daily  Problem # 2:  CARDIOMYOPATHY, ISCHEMIC (ICD-414.8)  Continue beta blocker and ACE inhibitor. EP evaluation for ICD. His updated medication list for this problem includes:    Plavix 75 Mg Tabs (Clopidogrel bisulfate) .[name]... Take one tablet by mouth daily    Nitroglycerin 0.4 Mg Subl (Nitroglycerin) ..... One tablet under tongue every 5 minutes as needed for chest pain---may repeat times three    Lotensin 10 Mg Tabs (Benazepril hcl) .[name]... Take 1 tablet by mouth once a day    Toprol Xl 50 Mg Xr24h-tab (Metoprolol succinate) .[name]... Take 1 tablet once a day    Aspirin 81 Mg Tbec (Aspirin) .[name]... Take one tablet by mouth daily  Orders: EP Referral (Cardiology EP Ref )  His updated medication list for this problem includes:    Plavix 75 Mg Tabs (Clopidogrel bisulfate) .[name]... Take one tablet by mouth daily    Nitroglycerin 0.4 Mg Subl (Nitroglycerin) ..... One tablet under tongue every 5 minutes as needed for chest pain---may repeat times three    Lotensin 10 Mg Tabs (Benazepril hcl) .[name]... Take 1 tablet by mouth once a day    Toprol Xl 50 Mg Xr24h-tab (Metoprolol succinate) .[name]... Take 1 tablet once a day    Aspirin 81 Mg Tbec (Aspirin) .[name]... Take one tablet by mouth daily  Problem # 3:  CAD, UNSPECIFIED SITE (ICD-414.00) Continue Plavix, beta blocker, ACE inhibitor and  statin. His updated medication list for this problem includes:    Plavix 75 Mg Tabs (Clopidogrel bisulfate) .[name]... Take one tablet by mouth daily    Nitroglycerin 0.4 Mg Subl (Nitroglycerin) ..... One tablet under tongue every 5 minutes as needed for chest pain---may repeat times three    Lotensin 10 Mg Tabs (Benazepril hcl) .[name]... Take 1 tablet by mouth once a day    Toprol Xl 50 Mg Xr24h-tab (Metoprolol succinate) .[name]... Take 1 tablet once a day    Aspirin 81 Mg Tbec (Aspirin) .[name]... Take one tablet by mouth daily  Problem # 4:  FEMORAL BRUIT, RIGHT (ICD-785.9) Patient has femoral bruit post catheterization. Schedule ultrasound. Orders: Arterial Duplex Lower Extremity (Arterial Duplex Low)  Problem # 5:  HYPERTENSION, UNSPECIFIED (ICD-401.9) Blood pressure controlled on present medications. Will continue. His updated medication list for this problem includes:    Lotensin 10 Mg Tabs (Benazepril hcl) .[name]... Take 1 tablet by mouth once a day    Toprol Xl 50 Mg Xr24h-tab (Metoprolol succinate) .[name]... Take 1  tablet once a day    Aspirin 81 Mg Tbec (Aspirin) .[name]... Take one tablet by mouth daily  Problem # 6:  HYPERLIPIDEMIA-MIXED (ICD-272.4) Continue statin. His updated medication list for this problem includes:    Simvastatin 40 Mg Tabs (Simvastatin) .[name]... Take one tablet by mouth daily at bedtime  Problem # 7:  GERD (ICD-530.81)  Patient Instructions: 1)  Your physician has recommended you make the following change in your medication: START ASPIRIN 81MG  ONCE DAILY 2)  Your physician wants you to follow-up in:3 MONTHS   You will receive a reminder letter in the mail two months in advance. If you don't receive a letter, please call our office to schedule the follow-up appointment. 3)  You have been referred to EP REFERRAL FOR ICD 4)  Your physician has requested that you have a lower extremity arterial duplex.  This test is an ultrasound of the arteries in the legs or arms.  It looks at arterial  blood flow in the legs and arms.  Allow one hour for Lower and Upper Arterial scans. There are no restrictions or special instructions.

## 2010-03-21 ENCOUNTER — Encounter: Payer: Self-pay | Admitting: Internal Medicine

## 2010-03-25 ENCOUNTER — Telehealth: Payer: Self-pay | Admitting: Cardiology

## 2010-03-26 ENCOUNTER — Telehealth: Payer: Self-pay | Admitting: Internal Medicine

## 2010-03-26 ENCOUNTER — Encounter: Payer: Self-pay | Admitting: Internal Medicine

## 2010-03-26 ENCOUNTER — Encounter: Payer: Self-pay | Admitting: Cardiology

## 2010-03-27 ENCOUNTER — Other Ambulatory Visit (INDEPENDENT_AMBULATORY_CARE_PROVIDER_SITE_OTHER): Payer: Medicaid Other

## 2010-03-27 ENCOUNTER — Other Ambulatory Visit: Payer: Self-pay

## 2010-03-27 ENCOUNTER — Encounter: Payer: Self-pay | Admitting: Internal Medicine

## 2010-03-27 DIAGNOSIS — I6350 Cerebral infarction due to unspecified occlusion or stenosis of unspecified cerebral artery: Secondary | ICD-10-CM

## 2010-03-27 DIAGNOSIS — I2589 Other forms of chronic ischemic heart disease: Secondary | ICD-10-CM

## 2010-03-27 LAB — BASIC METABOLIC PANEL
CO2: 29 mEq/L (ref 19–32)
Calcium: 9.1 mg/dL (ref 8.4–10.5)
Creatinine, Ser: 0.9 mg/dL (ref 0.4–1.5)
GFR: 92.72 mL/min (ref >60.00)
Potassium: 4.2 mEq/L (ref 3.5–5.1)
Sodium: 140 mEq/L (ref 135–145)

## 2010-03-28 ENCOUNTER — Other Ambulatory Visit: Payer: Self-pay | Admitting: Internal Medicine

## 2010-03-28 LAB — CBC WITH DIFFERENTIAL/PLATELET
Basophils Relative: 0.3 % (ref 0.0–3.0)
Eosinophils Absolute: 0.4 10*3/uL (ref 0.0–0.7)
HCT: 43 % (ref 39.0–52.0)
Lymphocytes Relative: 13.6 % (ref 12.0–46.0)
Lymphs Abs: 1.3 10*3/uL (ref 0.7–4.0)
Neutro Abs: 6.9 10*3/uL (ref 1.4–7.7)
RDW: 14.2 % (ref 11.5–14.6)

## 2010-04-01 ENCOUNTER — Ambulatory Visit (HOSPITAL_COMMUNITY): Payer: Medicaid Other

## 2010-04-01 ENCOUNTER — Ambulatory Visit (HOSPITAL_COMMUNITY)
Admission: RE | Admit: 2010-04-01 | Discharge: 2010-04-02 | Disposition: A | Discharge status: Home / Self Care | Payer: Medicaid Other | Source: Ambulatory Visit | Attending: Internal Medicine | Admitting: Internal Medicine

## 2010-04-01 DIAGNOSIS — I2589 Other forms of chronic ischemic heart disease: Secondary | ICD-10-CM | POA: Insufficient documentation

## 2010-04-01 DIAGNOSIS — Z23 Encounter for immunization: Secondary | ICD-10-CM | POA: Insufficient documentation

## 2010-04-01 DIAGNOSIS — I10 Essential (primary) hypertension: Secondary | ICD-10-CM | POA: Insufficient documentation

## 2010-04-01 DIAGNOSIS — E785 Hyperlipidemia, unspecified: Secondary | ICD-10-CM | POA: Insufficient documentation

## 2010-04-01 DIAGNOSIS — I509 Heart failure, unspecified: Secondary | ICD-10-CM | POA: Insufficient documentation

## 2010-04-01 DIAGNOSIS — K219 Gastro-esophageal reflux disease without esophagitis: Secondary | ICD-10-CM | POA: Insufficient documentation

## 2010-04-01 LAB — SURGICAL PCR SCREEN: MRSA, PCR: NEGATIVE

## 2010-04-02 ENCOUNTER — Inpatient Hospital Stay (HOSPITAL_COMMUNITY): Payer: Medicaid Other

## 2010-04-03 ENCOUNTER — Encounter: Payer: Self-pay | Admitting: Internal Medicine

## 2010-04-03 NOTE — Letter (Signed)
Summary: Clearance Letter  [employer], Main Office  [address]   [place], [place] [address]   Phone: [phone]  Fax: [phone]    March 26, 2010  Re:     [facility] Address:   [address]     [place], [place]  [address] DOB:     04/16/1955 MRN:     [id number]   To whom it may concern,  Mr. Smelser may hold his Plavix and Aspirin prior to his dental procedure. We would like for him to continue his Aspirin prior to surgery if possible. He should resume both of these medications after his dental surgery.        Sincerely,  Dr. Olga Millers Ellender Hose RN

## 2010-04-03 NOTE — Progress Notes (Signed)
Summary: pt wants to schdule procedure  Phone Note Call from Patient Call back at Home Phone [phone]   Caller: Spouse Reason for Call: Talk to Nurse, Talk to Doctor Summary of Call: pt wants to schedule defib/pacer implant and they want to schedule asap cause they may not have insurance by the end of the month Initial call taken by: Omer Jack,  March 26, 2010 1:26 PM  Follow-up for Phone Call        scheduled for 04/01/10 at 9:30am  labs 03/27/10 pt and wife aware Dennis Bast, RN, BSN  March 26, 2010 3:29 PM

## 2010-04-03 NOTE — Letter (Signed)
Summary: Implantable Device Instructions  [facility] [employer], Main Office  [address]   [place], [place] [address]   Phone: [phone]  Fax: [phone]      Implantable Device Instructions  You are scheduled for:  _____ Implantable Cardioverter Defibrillator   on 04/01/10  with Dr.Brinkley Peet.  1.  Please arrive at the Short Stay Center at [facility] at 7:30am on the day of your procedure.  2.  Do not eat or drink after midnight  the night before your procedure.  3.  Complete lab work on 03/27/10.  .  You do not have to be fasting.  4.  Do NOT take these medications for 3 days  prior to your procedure:  Plavix(last dose 03/28/10.  5.  Plan for an overnight stay.  Bring your insurance cards and a list of your medications.  6.  Wash your chest and neck with antibacterial soap (any brand) the evening before and the morning of your procedure.  Rinse well.  7.  Education material received:            ICD _____            *If you have ANY questions after you get home, please call the office [phone]. [name]  *Every attempt is made to prevent procedures from being rescheduled.  Due to the nauture of Electrophysiology, rescheduling can happen.  The physician is always aware and directs the staff when this occurs.

## 2010-04-03 NOTE — Progress Notes (Signed)
Summary: Calling about clearance to stop medication  Phone Note Other Incoming   Caller: Dr.Belcher/ [phone] [name] Summary of Call: Calling to get clearance to stop Aspirin and Plavix to have teeth moved Initial call taken by: Judie Grieve,  March 25, 2010 3:10 PM  Follow-up for Phone Call        spouse called and needs a call asap so pt can get dental work done and schedule pacer...per spouse pt already stopped asa last Thursday and  they are trying to get all this done prior to the end of the month because they are loosing their insurance... [name]  March 25, 2010 3:34 PM   Additional Follow-up for Phone Call Additional follow up Details #1::        ok to hold plavix prior to dental work; continue asa if possible; if not dc 5 days prior to procedure; resume both after procedure. Ferman Hamming, MD, [facility]  March 25, 2010 5:43 PM  Patient stopped his ASA 5 days ago and according to his dental office, this is okay for surgery. I will fax the clearance letter to their office. Whitney Maeola Sarah RN  March 26, 2010 10:32 AM

## 2010-04-08 ENCOUNTER — Ambulatory Visit (INDEPENDENT_AMBULATORY_CARE_PROVIDER_SITE_OTHER): Payer: Medicaid Other

## 2010-04-08 ENCOUNTER — Encounter: Payer: Self-pay | Admitting: Internal Medicine

## 2010-04-08 DIAGNOSIS — I428 Other cardiomyopathies: Secondary | ICD-10-CM

## 2010-04-09 NOTE — Miscellaneous (Signed)
Summary: Device preload  Clinical Lists Changes  Observations: Added new observation of ICD INDICATN: ICM (04/03/2010 7:49) Added new observation of ICDLEADSTAT1: active (04/03/2010 7:49) Added new observation of ICDLEADSER1: [id number]  (04/03/2010 7:49) Added new observation of ICDLEADMOD1: 0185  (04/03/2010 7:49) Added new observation of ICDLEADLOC1: RV  (04/03/2010 7:49) Added new observation of ICDLEADDOI1: 04/01/2010  (04/03/2010 7:49) Added new observation of ICD IMP MD: Lewayne Bunting, MD  (04/03/2010 7:49) Added new observation of ICD IMPL DTE: 04/01/2010  (04/03/2010 7:49) Added new observation of ICD SERL#: [id number]  (04/03/2010 7:49) Added new observation of ICD MODL#: [id number]  (04/03/2010 0:10) Added new observation of ICDMANUFACTR: St Jude  (04/03/2010 7:49) Added new observation of ICD MD: Lewayne Bunting, MD  (04/03/2010 7:49)       ICD Specifications Following MD:  Lewayne Bunting, MD     ICD Vendor:  St Jude     ICD Model Number:  [id number]     ICD Serial Number:  [id number] ICD DOI:  04/01/2010     ICD Implanting MD:  Lewayne Bunting, MD  Lead 1:    Location: RV     DOI: 04/01/2010     Model #: [id number]     Serial #: [id number]     Status: active  Indications::  ICM

## 2010-04-18 NOTE — Procedures (Signed)
Summary: wch. per amber.gd   Current Medications (verified): 1)  Plavix 75 Mg Tabs (Clopidogrel Bisulfate) .... Take One Tablet By Mouth Daily 2)  Simvastatin 40 Mg Tabs (Simvastatin) .... Take One Tablet By Mouth Daily At Bedtime 3)  Nitroglycerin 0.4 Mg Subl (Nitroglycerin) .... One Tablet Under Tongue Every 5 Minutes As Needed For Chest Pain---May Repeat Times Three 4)  Lotensin 10 Mg Tabs (Benazepril Hcl) .... Take 1 Tablet By Mouth Once A Day 5)  Toprol Xl 50 Mg Xr24h-Tab (Metoprolol Succinate) .... Take 1 Tablet Once A Day 6)  Gabapentin 300 Mg Caps (Gabapentin) .... As Needed 7)  Amitriptyline Hcl 25 Mg Tabs (Amitriptyline Hcl) .... As Needed 8)  Iris Study .... Uad 9)  Aspirin 81 Mg Tbec (Aspirin) .... Take One Tablet By Mouth Daily  Allergies (verified): No Known Drug Allergies   ICD Specifications Following MD:  Lewayne Bunting, MD     ICD Vendor:  St Jude     ICD Model Number:  [id number]     ICD Serial Number:  [id number] ICD DOI:  04/01/2010     ICD Implanting MD:  Lewayne Bunting, MD  Lead 1:    Location: RV     DOI: 04/01/2010     Model #: [id number]     Serial #: [id number]     Status: active  Indications::  ICM  Tech Comments:  see paceart report. Vella Kohler  April 08, 2010 10:00 AM   Patient Instructions: 1)  Scheduled for 06-11-10 @ 200pm with Dr Ladona Ridgel.

## 2010-04-23 NOTE — Letter (Signed)
Summary: [facility]   Imported By: Kassie Mends 04/18/2010 08:05:12  _____________________________________________________________________  External Attachment:    Type:   Image     Comment:   External Document

## 2010-04-23 NOTE — Cardiovascular Report (Signed)
Summary: Office Visit   Office Visit   Imported By: Roderic Ovens 04/19/2010 15:02:20  _____________________________________________________________________  External Attachment:    Type:   Image     Comment:   External Document

## 2010-04-24 NOTE — Op Note (Signed)
NAMESAMIN, MILKE              ACCOUNT NO.:  [id number]  MEDICAL RECORD NO.:  [id number]           PATIENT TYPE:  I  LOCATION:  2005                         FACILITY:  [facility]  PHYSICIAN:  Doylene Canning. Ladona Ridgel, MD    DATE OF BIRTH:  12-03-1955  DATE OF PROCEDURE:  04/01/2010 DATE OF DISCHARGE:                              OPERATIVE REPORT   PROCEDURE PERFORMED:  Insertion of a single-chamber defibrillator.  INDICATIONS:  Ischemic cardiomyopathy, class II congestive heart failure, EF 30% despite maximal medical therapy.  INTRODUCTION:  The patient is a 55 year old man with a long history of coronary artery disease status post myocardial infarction.  His EF is 30%.  He has nonsustained VT.  He has been on maximal medical therapy with metoprolol and benazepril and is now referred for ICD implantation secondary to all of the above in the setting of nonsustained ventricular tachycardia.  PROCEDURE:  After informed consent was obtained, the patient was taken to the diagnostic EP lab in a fasting state.  After usual preparation and draping, intravenous fentanyl and midazolam were given for sedation. A 30 mL of lidocaine was infiltrated into the left infraclavicular region.  A 7-cm incision was carried out over this region. Electrocautery was utilized to dissect down to the fascial plane.  The left subclavian vein was punctured, and the Guidant, model 0185, active- fixation defibrillation lead, serial number [id number], was advanced into the right ventricle.  Mapping was carried out in the final site.  The R- wave was measured 9 mV and the pace impedance was 790 ohms.  The pacing threshold was 0.5 volts at 0.5 milliseconds and 10-volt pacing did not stimulate the diaphragm.  A large injury current was noted with active fixation of the lead and 10-volt pacing did not stimulate the diaphragm. With the ventricular lead in satisfactory position, it was secured to the subpectoralis fascia with a  figure-of-eight silk suture.  The sewing sleeve was also secured with silk suture.  Electrocautery was then utilized to make a subcutaneous pocket.  Antibiotic irrigation was utilized to irrigate the pocket.  The St. Jude Fortify VR single-chamber defibrillator, serial number [id number], was connected to the defibrillation lead and placed back in the subcutaneous pocket where it was. Defibrillation threshold testing was then carried out.  After the patient was more deeply sedated with fentanyl and Versed, VF was induced with a T-wave shock.  A 20-joule shock was sharply delivered which terminated VF and restored sinus rhythm.  No additional defibrillation threshold testing was carried out, and the incision was closed with 2-0 and 3-0 Vicryl.  Benzoin and Steri-Strips were painted on the skin.  A pressure dressing was applied, and the patient was returned to his room in satisfactory condition.  COMPLICATIONS:  There were no immediate procedure complications.  Results have demonstrated successful implantation of a St. Jude single- chamber defibrillator in a patient with an ischemic cardiomyopathy, class II congestive heart failure, EF 30%, nonsustained ventricular tachycardia.     Doylene Canning. Ladona Ridgel, MD     GWT/MEDQ  D:  04/01/2010  T:  04/02/2010  Job:  [id number]  cc:  Veverly Fells. Excell Seltzer, MD  Electronically Signed by Lewayne Bunting MD on 04/24/2010 04:35:26 PM

## 2010-04-24 NOTE — Discharge Summary (Signed)
[name], [name]              ACCOUNT NO.:  [id number]  MEDICAL RECORD NO.:  [id number]           PATIENT TYPE:  I  LOCATION:  2005                         FACILITY:  [facility]  PHYSICIAN:  Doylene Canning. Ladona Ridgel, MD    DATE OF BIRTH:  04/16/1955  DATE OF ADMISSION:  04/01/2010 DATE OF DISCHARGE:  04/02/2010                              DISCHARGE SUMMARY   PRIMARY CARE PHYSICIAN:  [name], MD  PRIMARY CARDIOLOGIST:  Verne Carrow, MD  ELECTROPHYSIOLOGIST:  Doylene Canning. Ladona Ridgel, MD  PRIMARY DIAGNOSIS:  Ischemic cardiomyopathy with a documented ejection fraction of 30% despite optimal medical therapy.  SECONDARY DIAGNOSES: 1. Hypertension. 2. 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 3. Hyperlipidemia. 4. Gastroesophageal reflux disease. 5. Status post cholecystectomy. 6. History of cerebrovascular accident. 7. Patent foramen ovale.  ALLERGIES:  ADHESIVE.  PROCEDURES THIS ADMISSION: 1. Implantation of a single-chamber implantable cardioverter-     defibrillator by Dr. Ladona Ridgel on April 01, 2010.  The patient     received a St. Jude Medical, model number 1231-40, implantable     cardioverter-defibrillator with a model number 0185 right     ventricular lead.  Defibrillation threshold testings at the time of     implant were less than or equal to 20 joules.  The patient had no     early apparent complications. 2. Chest x-ray on April 02, 2010.  Final report is pending but     preliminary review by Dr. Ladona Ridgel shows no pneumothorax.  BRIEF HISTORY OF PRESENT ILLNESS:  [name] is a 55 year old male with a history of ischemic cardiomyopathy and a prior stroke.  He has class II congestive heart failure symptoms.  He has never had syncope or documented sustained ventricular tachycardia.  He was referred to  Dr. Ladona Ridgel for treatment options for prevention of sudden cardiac death.  He meets ICD implantation criteria; however, he is not a candidate for CRT therapy as his QRS duration is less than 120 milliseconds.  Risks, benefits, and alternatives of ICD implantation were discussed with the patient, he wished to proceed.  HOSPITAL COURSE:  The patient was admitted on April 01, 2010, for planned implantation of an implantable cardiac defibrillator.  This was carried out by Dr. Ladona Ridgel with details as outlined above.  The patient was monitored on telemetry overnight which demonstrated sinus rhythm with occasional PVCs.  The patient's left chest incision was without hematoma or ecchymosis.  Wound care, arm mobility, and shock plan were discussed with the patient.  Dr. Ladona Ridgel examined the patient on April 02, 1010, and considered him stable for discharge.  FOLLOWUP APPOINTMENTS: 1. [facility] on Monday, April 08, 2010, at     9:30 a.m. 2. Dr. Ladona Ridgel in May 2012 - the office will call to schedule that     appointment. 3. Dr. Clifton James as scheduled. 4. Dr. Jeannetta Nap as scheduled.  DISCHARGE INSTRUCTIONS: 1. Increase activity slowly. 2. No driving for one week. 3. Follow low-sodium  heart-healthy diet. 4. Keep incision clean and dry.  DISCHARGE MEDICATIONS: 1. Plavix 75 mg daily. 2. Simvastatin 40 mg daily. 3. Nitroglycerin 0.4 mg sublingual as needed for chest pain. 4. Lotensin 10 mg daily. 5. Toprol-XL 50 mg daily. 6. Gabapentin 300 mg as needed. 7. Amitriptyline 25 mg as needed. 8. IRIS study drug. 9. Aspirin 81 mg daily. 10.Clindamycin 300 mg three times daily.  DISPOSITION:  The patient was seen and examined by Dr. Ladona Ridgel on April 02, 2010, and considered stable for discharge.  DURATION OF DISCHARGE ENCOUNTER:  Thirty-five minutes.     Gypsy Balsam, RN,BSN   ______________________________ Doylene Canning. Ladona Ridgel, MD    AS/MEDQ  D:  04/02/2010  T:   04/02/2010  Job:  [id number]  cc:   Verne Carrow, MD [name], M.D.  Electronically Signed by Gypsy Balsam RNBSN on 04/08/2010 02:47:39 PM Electronically Signed by Lewayne Bunting MD on 04/24/2010 04:35:24 PM

## 2010-04-25 LAB — COMPREHENSIVE METABOLIC PANEL WITH GFR
ALT: 15 U/L (ref 0–53)
AST: 17 U/L (ref 0–37)
Alkaline Phosphatase: 60 U/L (ref 39–117)
BUN: 12 mg/dL (ref 6–23)
Creatinine, Ser: 1.4 mg/dL (ref 0.4–1.5)
GFR calc non Af Amer: 53 mL/min — ABNORMAL LOW (ref >60)
Glucose, Bld: 121 mg/dL — ABNORMAL HIGH (ref 70–99)
Total Bilirubin: 0.8 mg/dL (ref 0.3–1.2)

## 2010-04-25 LAB — CBC
HCT: 40.3 % (ref 39.0–52.0)
HCT: 40.8 % (ref 39.0–52.0)
HCT: 44.9 % (ref 39.0–52.0)
Hemoglobin: 13.2 g/dL (ref 13.0–17.0)
Hemoglobin: 13.3 g/dL (ref 13.0–17.0)
Hemoglobin: 15.2 g/dL (ref 13.0–17.0)
MCH: 29.4 pg (ref 26.0–34.0)
MCH: 30.2 pg (ref 26.0–34.0)
MCHC: 32.6 g/dL (ref 30.0–36.0)
MCHC: 33.9 g/dL (ref 30.0–36.0)
MCV: 88.4 fL (ref 78.0–100.0)
MCV: 89.3 fL (ref 78.0–100.0)
Platelets: 186 10*3/uL (ref 150–400)
Platelets: 207 K/uL (ref 150–400)
RBC: 4.56 MIL/uL (ref 4.22–5.81)
RBC: 5.03 MIL/uL (ref 4.22–5.81)
RDW: 13.2 % (ref 11.5–15.5)
RDW: 13.5 % (ref 11.5–15.5)
WBC: 13.8 10*3/uL — ABNORMAL HIGH (ref 4.0–10.5)
WBC: 7.9 10*3/uL (ref 4.0–10.5)

## 2010-04-25 LAB — POCT CARDIAC MARKERS
CKMB, poc: <1 ng/mL — ABNORMAL LOW (ref 1.0–8.0)
Myoglobin, poc: 97.9 ng/mL (ref 12–200)
Troponin i, poc: <0.05 ng/mL (ref 0.00–0.09)

## 2010-04-25 LAB — BENZODIAZEPINE, QUANTITATIVE, URINE
Flurazepam GC/MS Conf: NEGATIVE NG/ML
Oxazepam GC/MS Conf: NEGATIVE NG/ML
Temazepam GC/MS Conf: NEGATIVE NG/ML

## 2010-04-25 LAB — COMPREHENSIVE METABOLIC PANEL
Albumin: 3.2 g/dL — ABNORMAL LOW (ref 3.5–5.2)
CO2: 25 mEq/L (ref 19–32)
CO2: 25 mEq/L (ref 19–32)
Calcium: 8.1 mg/dL — ABNORMAL LOW (ref 8.4–10.5)
Calcium: 8.6 mg/dL (ref 8.4–10.5)
Chloride: 109 mEq/L (ref 96–112)
Creatinine, Ser: 1.04 mg/dL (ref 0.4–1.5)
GFR calc Af Amer: >60 mL/min (ref >60)
GFR calc Af Amer: >60 mL/min (ref >60)
GFR calc non Af Amer: >60 mL/min (ref >60)
Glucose, Bld: 135 mg/dL — ABNORMAL HIGH (ref 70–99)
Potassium: 3.6 mEq/L (ref 3.5–5.1)
Sodium: 140 mEq/L (ref 135–145)
Total Protein: 5.3 g/dL — ABNORMAL LOW (ref 6.0–8.3)
Total Protein: 6 g/dL (ref 6.0–8.3)

## 2010-04-25 LAB — RAPID URINE DRUG SCREEN, HOSP PERFORMED
Amphetamines: NOT DETECTED
Barbiturates: NOT DETECTED
Benzodiazepines: POSITIVE — AB
Cocaine: NOT DETECTED
Opiates: NOT DETECTED
Tetrahydrocannabinol: NOT DETECTED

## 2010-04-25 LAB — GLUCOSE, CAPILLARY: Glucose-Capillary: 139 mg/dL — ABNORMAL HIGH (ref 70–99)

## 2010-04-25 LAB — PROTIME-INR
INR: 0.98 (ref 0.00–1.49)
Prothrombin Time: 13.2 seconds (ref 11.6–15.2)

## 2010-04-25 LAB — CULTURE, BLOOD (ROUTINE X 2)
Culture: NO GROWTH
Culture: NO GROWTH

## 2010-04-25 LAB — MAGNESIUM: Magnesium: 2.1 mg/dL (ref 1.5–2.5)

## 2010-04-25 LAB — URINE MICROSCOPIC-ADD ON

## 2010-04-25 LAB — URINALYSIS, ROUTINE W REFLEX MICROSCOPIC
Bilirubin Urine: NEGATIVE
Glucose, UA: NEGATIVE mg/dL
Ketones, ur: NEGATIVE mg/dL
Leukocytes, UA: NEGATIVE
Nitrite: NEGATIVE
Protein, ur: NEGATIVE mg/dL
Specific Gravity, Urine: 1.015 (ref 1.005–1.030)
Urobilinogen, UA: 0.2 mg/dL (ref 0.0–1.0)
pH: 6.5 (ref 5.0–8.0)

## 2010-04-25 LAB — URINE CULTURE
Colony Count: NO GROWTH
Culture  Setup Time: 201109060159
Culture: NO GROWTH

## 2010-04-25 LAB — ROCKY MTN SPOTTED FVR AB, IGM-BLOOD: RMSF IgM: 0.14 IV (ref 0.00–0.89)

## 2010-04-25 LAB — DIFFERENTIAL
Basophils Absolute: 0 10*3/uL (ref 0.0–0.1)
Basophils Relative: 0 % (ref 0–1)
Eosinophils Absolute: 0.2 K/uL (ref 0.0–0.7)
Eosinophils Relative: 2 % (ref 0–5)
Lymphocytes Relative: 13 % (ref 12–46)
Lymphocytes Relative: 6 % — ABNORMAL LOW (ref 12–46)
Lymphs Abs: 0.8 K/uL (ref 0.7–4.0)
Lymphs Abs: 1.2 10*3/uL (ref 0.7–4.0)
Monocytes Absolute: 0.7 10*3/uL (ref 0.1–1.0)
Monocytes Relative: 5 % (ref 3–12)
Neutro Abs: 12.1 10*3/uL — ABNORMAL HIGH (ref 1.7–7.7)
Neutrophils Relative %: 76 % (ref 43–77)
Neutrophils Relative %: 88 % — ABNORMAL HIGH (ref 43–77)

## 2010-04-25 LAB — BASIC METABOLIC PANEL
Chloride: 108 mEq/L (ref 96–112)
Creatinine, Ser: 0.88 mg/dL (ref 0.4–1.5)
GFR calc Af Amer: >60 mL/min (ref >60)
Potassium: 3.5 mEq/L (ref 3.5–5.1)
Sodium: 141 mEq/L (ref 135–145)

## 2010-04-25 LAB — EHRLICHIA ANTIBODY PANEL
E chaffeensis (HGE) Ab, IgM: <1:20 {titer}
Interpretation-ECHAB: NOT DETECTED

## 2010-04-25 LAB — URINE DRUGS OF ABUSE SCREEN W ALC, ROUTINE (REF LAB)
Amphetamine Screen, Ur: NEGATIVE
Benzodiazepines.: POSITIVE — AB
Ethyl Alcohol: <10 mg/dL (ref <10)
Marijuana Metabolite: NEGATIVE
Opiate Screen, Urine: NEGATIVE
Propoxyphene: NEGATIVE

## 2010-04-25 LAB — VITAMIN B1: Vitamin B1 (Thiamine): 14 nmol/L (ref 9–44)

## 2010-04-25 LAB — CK TOTAL AND CKMB (NOT AT ARMC)
CK, MB: 1.4 ng/mL (ref 0.3–4.0)
Relative Index: INVALID (ref 0.0–2.5)
Total CK: 87 U/L (ref 7–232)

## 2010-04-25 LAB — CARDIAC PANEL(CRET KIN+CKTOT+MB+TROPI)
Total CK: 69 U/L (ref 7–232)
Troponin I: 0.01 ng/mL (ref 0.00–0.06)

## 2010-04-25 LAB — LACTIC ACID, PLASMA: Lactic Acid, Venous: 1.2 mmol/L (ref 0.5–2.2)

## 2010-04-25 LAB — LIPASE, BLOOD: Lipase: 25 U/L (ref 11–59)

## 2010-04-25 LAB — ROCKY MTN SPOTTED FVR AB, IGG-BLOOD: RMSF IgG: 0.08 IV

## 2010-04-25 LAB — TROPONIN I: Troponin I: 0.01 ng/mL (ref 0.00–0.06)

## 2010-04-29 ENCOUNTER — Ambulatory Visit (INDEPENDENT_AMBULATORY_CARE_PROVIDER_SITE_OTHER): Payer: Medicaid Other | Admitting: Cardiology

## 2010-04-29 ENCOUNTER — Encounter: Payer: Self-pay | Admitting: Cardiology

## 2010-04-29 DIAGNOSIS — I2510 Atherosclerotic heart disease of native coronary artery without angina pectoris: Secondary | ICD-10-CM

## 2010-04-29 DIAGNOSIS — I2589 Other forms of chronic ischemic heart disease: Secondary | ICD-10-CM

## 2010-04-29 DIAGNOSIS — F17200 Nicotine dependence, unspecified, uncomplicated: Secondary | ICD-10-CM | POA: Insufficient documentation

## 2010-05-03 ENCOUNTER — Encounter: Payer: Self-pay | Admitting: Cardiology

## 2010-05-08 ENCOUNTER — Telehealth: Payer: Self-pay | Admitting: *Deleted

## 2010-05-08 NOTE — Telephone Encounter (Signed)
Received cholesterol results from dr Jeannetta Nap. The LDL = 133. Dr Jens Som would like pt to d/c zocor and start crestor 40mg  once daily, and have repeat lab work in 6 weeks.

## 2010-05-09 ENCOUNTER — Telehealth: Payer: Self-pay | Admitting: *Deleted

## 2010-05-09 NOTE — Telephone Encounter (Signed)
Spoke with pt, he is aware of results. He is unable to change to a non-generic at this time due to financial reasons. Pt will remain on the zocor and will call in the future when his income is straightened out.

## 2010-05-09 NOTE — Assessment & Plan Note (Signed)
Summary: FOLLOW UP - 3 MONTHS   Primary Provider:  wilson elkins   History of Present Illness: [name] is 55 years old and return for CM, CAD and prior CVA. Previously followed by Dr Juanda Chance. 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 In 2002 he had a bare-metal stent to the right coronary artery. He was admitted with a stroke with symptoms of confusion and ataxia in Sept of 2011. He had a TEE which showed an ejection fraction of 30-35% with no evident thrombus. 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 His MRI showed multiple posterior circulation infarct. Because his LV function was worse he underwent cardiac catheterization in January of 2012. This revealed 25 LM. There is a 50-60% stenosis of the mid-LAD. There is a diagonal branch arising from this area with ostial 60-70% stenosis. There is an ostial 50% stenosis of the circumflex/intermediate noted. There is a patent stent in the RCAmidvessel with 30-40% in-stent restenosis. The proximal and distal vessel both have 50% stenosis noted. The ejection fraction was 35%. Patient had an ICD placed February 2012. Since then, the patient denies any dyspnea on exertion, orthopnea, PND, pedal edema, palpitations, syncope or chest pain.   Current Medications (verified): 1)  Plavix 75 Mg Tabs (Clopidogrel Bisulfate) .... Take One Tablet By Mouth Daily 2)  Simvastatin 40 Mg Tabs (Simvastatin) .... Take One Tablet By Mouth Daily At Bedtime 3)  Nitroglycerin 0.4 Mg Subl (Nitroglycerin) .... One Tablet Under Tongue Every 5 Minutes As Needed For Chest Pain---May Repeat Times Three 4)  Lotensin 10 Mg Tabs (Benazepril Hcl) .... Take 1 Tablet By Mouth Once A Day 5)  Toprol Xl 50 Mg Xr24h-Tab (Metoprolol Succinate) .... Take 1 Tablet Once A Day 6)  Gabapentin 300 Mg Caps (Gabapentin) .... As Needed 7)  Amitriptyline Hcl 25 Mg Tabs (Amitriptyline Hcl) .... As Needed 8)  Iris Study ....  Uad 9)  Aspirin 81 Mg Tbec (Aspirin) .... Take One Tablet By Mouth Daily  Allergies (verified): No Known Drug Allergies  Past History:  Past Surgical History: Cholecystectomy...12/02/02 ERCP and Upper endoscopy..11/24/02 s/p PTCA stent of the RCA..07/24/00 Hernia repair Appendectomy Left ankle pinning ICD  Social History: Reviewed history from 05/18/2008 and no changes required. Married  Tobacco Use - Yes.  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  Review of Systems       no fevers or chills, productive cough, hemoptysis, dysphasia, odynophagia, melena, hematochezia, dysuria, hematuria, rash, seizure activity, orthopnea, PND, pedal edema, claudication. Remaining systems are negative.   Vital Signs:  Patient profile:   55 year old male Weight:      203 pounds Pulse rate:   60 / minute Pulse rhythm:   regular BP sitting:   130 / 82  (left arm) Cuff size:   large  Vitals Entered By: Deliah Goody, RN (April 29, 2010 8:43 AM)  Physical Exam  General:  Well-developed well-nourished in no acute distress.  Skin is warm and dry.  HEENT is normal.  Neck is supple. No thyromegaly.  Chest is clear to auscultation with normal expansion. ICD without evidence of infection. Cardiovascular exam is regular rate and rhythm.  Abdominal exam nontender or distended. No masses palpated. Extremities show no edema. neuro grossly intact     ICD Specifications Following MD:  Trevor Bunting,  MD     ICD Vendor:  St Jude     ICD Model Number:  [id number]     ICD Serial Number:  [id number] ICD DOI:  04/01/2010     ICD Implanting MD:  Trevor Bunting, MD  Lead 1:    Location: RV     DOI: 04/01/2010     Model #: [id number]     Serial #: [id number]     Status: active  Indications::  ICM   Impression & Recommendations:  Problem # 1:  CARDIOMYOPATHY, ISCHEMIC (ICD-414.8) Continue present medications. ICD now in place. His updated medication list for  this problem includes:    Plavix 75 Mg Tabs (Clopidogrel bisulfate) .[name]... Take one tablet by mouth daily    Nitroglycerin 0.4 Mg Subl (Nitroglycerin) ..... One tablet under tongue every 5 minutes as needed for chest pain---may repeat times three    Lotensin 10 Mg Tabs (Benazepril hcl) .[name]... Take 1 tablet by mouth once a day    Toprol Xl 50 Mg Xr24h-tab (Metoprolol succinate) .[name]... Take 1 tablet once a day    Aspirin 81 Mg Tbec (Aspirin) .[name]... Take one tablet by mouth daily  Problem # 2:  CAD, UNSPECIFIED SITE (ICD-414.00) Continue aspirin, Plavix, beta blocker and statin. His updated medication list for this problem includes:    Plavix 75 Mg Tabs (Clopidogrel bisulfate) .[name]... Take one tablet by mouth daily    Nitroglycerin 0.4 Mg Subl (Nitroglycerin) ..... One tablet under tongue every 5 minutes as needed for chest pain---may repeat times three    Lotensin 10 Mg Tabs (Benazepril hcl) .[name]... Take 1 tablet by mouth once a day    Toprol Xl 50 Mg Xr24h-tab (Metoprolol succinate) .[name]... Take 1 tablet once a day    Aspirin 81 Mg Tbec (Aspirin) .[name]... Take one tablet by mouth daily  Problem # 3:  HYPERTENSION, UNSPECIFIED (ICD-401.9) Blood pressure controlled with present medications. Will continue. His updated medication list for this problem includes:    Lotensin 10 Mg Tabs (Benazepril hcl) .[name]... Take 1 tablet by mouth once a day    Toprol Xl 50 Mg Xr24h-tab (Metoprolol succinate) .[name]... Take 1 tablet once a day    Aspirin 81 Mg Tbec (Aspirin) .[name]... Take one tablet by mouth daily  Problem # 4:  HYPERLIPIDEMIA-MIXED (ICD-272.4) Continue statin. Check lipids and liver. His updated medication list for this problem includes:    Simvastatin 40 Mg Tabs (Simvastatin) .[name]... Take one tablet by mouth daily at bedtime  His updated medication list for this problem includes:    Simvastatin 40 Mg Tabs (Simvastatin) .[name]... Take one tablet by mouth daily at bedtime  Problem # 5:  CVA (ICD-434.91)  His updated  medication list for this problem includes:    Plavix 75 Mg Tabs (Clopidogrel bisulfate) .[name]... Take one tablet by mouth daily    Aspirin 81 Mg Tbec (Aspirin) .[name]... Take one tablet by mouth daily  His updated medication list for this problem includes:    Plavix 75 Mg Tabs (Clopidogrel bisulfate) .[name]... Take one tablet by mouth daily    Aspirin 81 Mg Tbec (Aspirin) .[name]... Take one tablet by mouth daily  Problem # 6:  TOBACCO ABUSE (ICD-305.1) Patient counseled on discontinuing.  Patient Instructions: 1)  Your physician wants you to follow-up in: 6 MONTHS  You will receive a reminder letter in the mail two months in advance. If you don't receive a letter, please call our office to schedule the follow-up appointment. Prescriptions: NITROGLYCERIN 0.4 MG SUBL (  NITROGLYCERIN) One tablet under tongue every 5 minutes as needed for chest pain---may repeat times three  #25 x 12   Entered by:   Deliah Goody, RN   Authorized by:   Ferman Hamming, MD, [facility]   Signed by:   Deliah Goody, RN on 04/29/2010   Method used:   Electronically to        [facility]* (retail)       [address] [place], [place]  [address]       Ph: [phone] or [phone]       Fax: [phone]   RxID:   [id number]

## 2010-06-07 ENCOUNTER — Encounter: Payer: Self-pay | Admitting: Internal Medicine

## 2010-06-11 ENCOUNTER — Telehealth: Payer: Self-pay | Admitting: Cardiology

## 2010-06-11 ENCOUNTER — Encounter: Payer: Medicaid Other | Admitting: Internal Medicine

## 2010-06-11 NOTE — Telephone Encounter (Signed)
Pt calling with med questions, pt requesting a call from debra and will wait until tomorrow for her call

## 2010-06-13 NOTE — Telephone Encounter (Signed)
Spoke with pt, he needs plavix ordered from [facility]. meds ordered. Will call pt when arrives [facility]

## 2010-06-13 NOTE — Telephone Encounter (Signed)
Pt rtn your call pls call him back at [phone]

## 2010-06-19 ENCOUNTER — Telehealth: Payer: Self-pay | Admitting: *Deleted

## 2010-06-19 NOTE — Telephone Encounter (Signed)
Pt aware that plavix has arrived and will be placed at the front desk.

## 2010-06-20 ENCOUNTER — Telehealth: Payer: Self-pay | Admitting: Cardiology

## 2010-06-20 NOTE — Telephone Encounter (Signed)
Walk In pt Form  " Pt Has Jury Duty" Please call sent to [facility]  06/20/10/km

## 2010-06-24 ENCOUNTER — Encounter: Payer: Self-pay | Admitting: *Deleted

## 2010-06-25 NOTE — H&P (Signed)
NAME:  KRISTAIN, HU              ACCOUNT NO.:  [id number]   MEDICAL RECORD NO.:  [id number]          PATIENT TYPE:  INP   LOCATION:  1827                         FACILITY:  [facility]   PHYSICIAN:  Pramod P. Pearlean Brownie, MD    DATE OF BIRTH:  26-Jul-1955   DATE OF ADMISSION:  02/03/2008  DATE OF DISCHARGE:                              HISTORY & PHYSICAL   REFERRING PHYSICIAN:  Shelda Jakes, MD   REASON FOR REFERRAL:  Stroke.   HISTORY OF PRESENT ILLNESS:  Mr. Snelson is a 55 year old Caucasian male  who developed sudden onset of left-sided weakness at 10:45 today.  He  states he was awake and he went on the ground and looked beneath his bed  to see what his dog was doing.  As he tried to get up, he noticed that  his left leg gave out.  He had trouble getting up and noticed his left  hand was also weak.  The wife called EMS and by the time they arrived  the patient noticed that his speech was slurred and it was difficult to  understand.  This however improved.  By the time he reached the  hospital, speech was better and his arm has also improved.  He still had  some leg weakness remaining which has not improved.  He denies any  headache, any paresthesias or numbness.  He denies prior history of  stroke, TIAs, migraines or seizures.   PAST MEDICAL HISTORY:  Significant for:  1. Hypertension which has been recently diagnosed.  2. Coronary artery disease for which he has undergone angioplasty      stenting 10 years ago and had an MI at age 55.   PAST SURGICAL HISTORY:  Multiple surgeries including left shoulder, left  knee and foot.   MEDICATION ALLERGIES:  None.   FAMILY HISTORY:  Positive for heart disease in mother and grandmother.   SOCIAL HISTORY:  The patient is married, lives with his wife in [place],  he is presently unemployed.  He smokes half to one pack per day.  Denies  drinking alcohol.   REVIEW OF SYSTEMS:  Negative for recurrent chest pain, fever, cough,  shortness of  breath, diarrhea or other illness.   PHYSICAL EXAMINATION:  GENERAL:  A healthy looking middle-aged Caucasian  male who is at present not in distress.  He is afebrile.  VITAL SIGNS:  Pulse rate is 72 per minute, blood pressure on admission  was 188/120 with 20 mg of labetalol, it came down to 165/ 94.  Respiratory rate 18 permanent, sats 98%, weight 197 pounds.  HEAD:  Nontraumatic.  NECK:  Supple.  There is no bruit.  ENT:  Unremarkable.  CARDIAC:  No murmur or gallop.  LUNGS:  Clear to auscultation.  ABDOMEN:  Soft, nontender.  NEUROLOGIC:  The patient is pleasant, awake, alert, and cooperative.  There is no aphasia, apraxia or dysarthria.  Eye movements have full  range.  Visual Jodoin are full to confrontation testing.  There is  minimum left nasolabial fold asymmetry.  Tongue is midline.  Motor  system exam reveals no  definite upper extremity drift, however, he has  diminished fine finger movements on the left.  He orbits the right over  left upper extremity.  The left grip is slightly weaker than the right.  He has mild left lower extremity drift.  He has finger-to-nose and knee-  to-heel dysmetria on the left side.  There was no subjective sensory  loss on the left.  He has significant past-pointing on the left with  eyes closed.  Gait was not tested.   DATA REVIEWED:  Noncontrast CAT scan of the head done today reveals no  acute abnormality.  Old remote age-indeterminate lacunar infarcts are  noted in the right internal capsule and pons.  Admission labs are  pending at this time.   IMPRESSION:  A 55 year old gentleman with sudden onset of left  hemiparesis likely due to the right subcortical infarct.  The patient  has improved but still has persistent deficits and an NIH Stroke Scale  of 4.  He has presented within 3 hours and does qualify for thrombolysis  with intravenous tPA.  I have discussed the risk and benefit of  intravenous tPA with the patient, wife and daughter  and explained the 5-  10% risk of hemorrhage.  I would recommend to continue to give  intravenous tPA to ensure continuing improvement.  Family agrees.  They  were given opportunity to ask questions which were answered.  The  patient will be admitted through the intensive care unit.  We will  monitor blood pressure strictly and follow post t-PA protocol.  Physical  and occupation therapy consults.  Check MRI scan of the brain with MRA  of the brain, carotid Dopplers, 2-D echo, fasting lipid profile,  hemoglobin A1c.  Start aspirin.  The patient is critically ill and had  significant risk for worsening.  His care requires complex decision-  making and frequent monitoring of neurological status and blood  pressure.  I spent 1 hour of critical care time providing care for this  patient.           ______________________________  Sunny Schlein. Pearlean Brownie, MD     PPS/MEDQ  D:  02/04/2008  T:  02/04/2008  Job:  [id number]

## 2010-06-25 NOTE — Assessment & Plan Note (Signed)
[facility]                            CARDIOLOGY OFFICE NOTE   NAME:FIELDSSantiel, Topper                     MRN:          [id number]  DATE:01/26/2008                            DOB:          [birth year]    REFERRING PHYSICIAN:  [name], M.D.   PRIMARY CARE PHYSICIAN AND REFERRING PHYSICIAN:  [name], MD   REASON FOR REFERRAL:  Evaluation of chest and arm pain and fatigue.   PAST CLINICAL HISTORY:  Mr. Abel is 55 years old and had a  diaphragmatic wall infarction treated with PTCA in 1995, and  subsequently had a stenting of the right coronary artery in 2002 with a  bare-metal stent.  Due to the insurance reasons, we have not seen him in  some time.   Recently, he developed some symptoms of arm pain and wrist pain, which  has not been related to exertion.  He has also had symptoms of fatigue.  He has not been able to do his activities that is usually because of  recent arthroscopic knee surgery by Dr. Darrelyn Hillock a couple of months ago.  His wife was quite concerned about his arm pain and his sister's brother  recently died of a heart attack and these events heightened his  concerns.   PAST MEDICAL HISTORY:  Significant for hyperlipidemia, hypertension, and  GERD.  He has had difficulty tolerating cholesterol medications in the  past.  He has had a previous history of cholecystectomy, and he has had  arthroscopic knee surgery recently.   FAMILY HISTORY:  Positive for coronary artery disease with a father who  had bypass surgery at 32 and died in his 40s.   SOCIAL HISTORY:  He had previously worked Research scientist (physical sciences) trucks.  He got  laid off early this year, and is currently on [employer].  He still  smokes.  He lives with his wife and has 2 children, and is expecting  grandchildren soon.  He had been under a lot of stress due to his loss  of his job and his knee surgery.   REVIEW OF SYSTEMS:  Positive for symptoms of fatigue.   PHYSICAL EXAMINATION:  VITAL SIGNS:  The blood pressure was 159/97 and  the pulse was 62 and regular.  NECK:  There was no venous distension.  The carotid pulses were full and  there were no bruits.  CHEST:  Clear without rales or rhonchi.  HEART:  Rhythm is regular.  The heart sounds were normal.  There were no  murmurs or gallops.  ABDOMEN:  Soft with normal bowel sounds.  There is no  hepatosplenomegaly.  No pulsatile masses.  EXTREMITIES:  The peripheral pulses were full with no peripheral edema.  MUSCULOSKELETAL:  No deformities.  SKIN:  Warm and dry.  NEUROLOGIC:  No focal neurological signs.   An electrocardiogram showed nonspecific ST-T changes.   IMPRESSION:  1. Coronary artery disease status post diaphragmatic wall infarction      in 1995, treated with percutaneous transluminal coronary      angioplasty with subsequent stenting of the right coronary artery  in 2002.  2. Left arm pain and symptoms of fatigue, rule out myocardial      ischemia.  3. Hyperlipidemia.  Intolerant to MULTIPLE CHOLESTEROL MEDICATIONS.  4. Hypertension.  Under optimal control.  5. Gastroesophageal reflux disease  6. Status post cholecystectomy.  7. Continued tobacco use.   RECOMMENDATIONS:  Mr. Thielen' symptoms are not real typical for ischemic  heart disease, but they certainly could represent ischemic equivalents  and he has no known previous coronary artery disease.  We will plan to  evaluate him with rest/stress adenosine Myoview scan.  I encouraged him  to stop smoking, but as he indicated now is not a good time with all the  stress he is under.  He is very reluctant to take any cholesterol  medications.  We will start him on Lotensin 10 mg for his blood  pressure.  Laboratory findings for fasting lipid and liver, CBC, BMP,  and TSH.  We will be in touch with him by phone after we have the  results of these tests.  Otherwise, we will see him back in followup in  the year.      Bruce Elvera Lennox Juanda Chance, MD, [facility]  Electronically Signed    BRB/MedQ  DD: 01/26/2008  DT: 01/27/2008  Job #: [id number]

## 2010-06-25 NOTE — Discharge Summary (Signed)
NAMESIDDIQ, KALUZNY              ACCOUNT NO.:  [id number]   MEDICAL RECORD NO.:  [id number]           PATIENT TYPE:   LOCATION:                                 FACILITY:   PHYSICIAN:  Levert Feinstein, MD          DATE OF BIRTH:  Mar 04, 1955   DATE OF ADMISSION:  DATE OF DISCHARGE:                               DISCHARGE SUMMARY   DISCHARGE DIAGNOSES:  1. Right internal capsule small vessel stroke, status post tissue      plasminogen activator.  2. Hypertension.  3. Hyperlipidemia.  4. Tobacco abuse.  5. Coronary artery disease.   DISCHARGE MEDICATIONS:  1. Plavix 75 mg every day.  2. Pepcid 20 mg every day.  3. Lopressor 50 mg b.i.d.  4. Zocor 20 mg every day.   HISTORY OF PRESENT ILLNESS:  The patient is a 55 year old right-handed  Caucasian male, presenting with acute onset of left-sided arm and leg  weakness on February 03, 2008, at 10:45 p.m.  He arrived at [facility] ER  with 3 hours window, was noticed to have mild slurred speech, mild  clumsy on the left hand and grip weakness, mild dysmetria on the left  finger-to-nose and heel-to-shin, the left lower extremity drift with NIH  stroke scale of 4.  He subsequently received IV tPA, had an improvement  with no complications.  An MRI has demonstrated acute stroke involving  posterior limb of the right internal capsule.  MRA of the brain has  demonstrated high-grade stenosis at the right PCA and 40% stenosis of  distal left vertebral artery beyond PICA and less than 50% stenosis of  left internal carotid artery.  Echocardiogram was done, but the report  is pending.  Lab evaluation has demonstrated A1c of 5.8.  UDS was  negative.  Elevated LDL of 118 and triglyceride of 171.   He was discharged home with Plavix, Zocor, and follow up with Dr. Pearlean Brownie  in 2 months and also advised him stop smoking and tight blood pressure  control with goal less than 130/80.      Levert Feinstein, MD  Electronically Signed     YY/MEDQ  D:  02/05/2008   T:  02/05/2008  Job:  [id number]

## 2010-06-25 NOTE — Op Note (Signed)
[name], BAILLY              ACCOUNT NO.:  [id number]   MEDICAL RECORD NO.:  [id number]          PATIENT TYPE:  AMB   LOCATION:  DAY                          FACILITY:  [facility]   PHYSICIAN:  Ronald A. Gioffre, M.D.DATE OF BIRTH:  1955/09/15   DATE OF PROCEDURE:  11/09/2007  DATE OF DISCHARGE:                               OPERATIVE REPORT   ASSISTANT:  Nurse.   PREOPERATIVE DIAGNOSES:  1. Fibrillated irregularity of the medial meniscus.  2. Large ganglion cyst medial femoral condyle, left knee.   POSTOPERATIVE DIAGNOSES:  1. Fibrillated irregularity of the medial meniscus.  2. Large ganglion cyst medial femoral condyle, left knee.   OPERATION:  1. Arthrotomy, left knee.  2. Curettement of a large ganglion cyst and bone grafting with      allograft material into the defect.  3. Exploration of the medial meniscus.   PROCEDURE:  Under general anesthesia a routine orthopedic prep and drape  of the left lower extremity carried out.  He was given 1 gram of IV  Ancef.  Medial incision was made over the medial femoral condyle down  across the joint.  I then exposed the distal medial femoral condyle  region and explored the joint.  The meniscus was intact.  I previously  was going to arthroscope the knee but decided not to do that because I  did not feel this small fibrillated meniscus was the problem.  He had  previous surgery, prior arthroscopy prior to this, about 15 years ago.  I also felt at this time, I could simply look at the meniscus at the  time of the incision.  I then freed up the soft tissue in that area.  I  utilized the oscillating saw and made a small bone window, removed the  bone window and later replaced it.  Then I went down and the cyst was  from anterior to posterior, the large ganglion cyst.  I removed the  membrane first, sent it off to the lab and then curetted out the cyst to  raw bone.  Note there was a large amount of gelatinous cystic material  in the  cyst.  Once I thoroughly cleaned out the cyst and incidentally,  the cyst did fracture through the medial femoral cortex.  I then packed  the cyst with cancellus bone graft from the bone bank.  I then replaced  the window and resutured the soft tissue material back over the window.  Also as I mentioned I did explore the joint.  There was no significant  meniscal tear.  The wound was irrigated prior to inserting the graft.  I  then closed subcu with 0 Vicryl, skin with metal staples.  Sterile  Neosporin dressing was applied.  He was then placed in a knee  immobilizer.           ______________________________  Georges Lynch Darrelyn Hillock, M.D.     RAG/MEDQ  D:  11/09/2007  T:  11/09/2007  Job:  [id number]

## 2010-06-28 NOTE — Op Note (Signed)
NAME:  [name], [name]                        ACCOUNT NO.:  [id number]   MEDICAL RECORD NO.:  [id number]                   PATIENT TYPE:  INP   LOCATION:  0365                                 FACILITY:  [facility]   PHYSICIAN:  Currie Paris, M.D.           DATE OF BIRTH:  [birth year]   DATE OF PROCEDURE:  11/23/2002  DATE OF DISCHARGE:                                 OPERATIVE REPORT   PREOPERATIVE DIAGNOSIS:  Chronic cholecystitis with recent biliary  pancreatitis.   POSTOPERATIVE DIAGNOSES:  Chronic cholecystitis with recent biliary  pancreatitis.   OPERATION:  Laparoscopic cholecystectomy with operative cholangiogram.   SURGEON:  Currie Paris, M.D.   ASSISTANT:  Sharlet Salina T. Hoxworth, M.D.   ANESTHESIA:  General endotracheal.   CLINICAL HISTORY:  This patient presented to our office today with recent  history of abdominal pain over three days, mainly epigastric with some  nausea.  It was getting a little bit better the day of admission, but had  decreased appetite.  He had been noted to have elevated amylase and lipase.  On examination he is noted to have some epigastric tenderness and guarding.  The initial impression was developing cholecystitis, in a patient who had  pancreatitis.  Repeat amylase and lipase today had returned to normal.   DESCRIPTION OF PROCEDURE:  The patient was seen by me after Dr. Magnus Ivan had  admitted him, and indications, risks and complications of the surgery  discussed.  He was seen again in the holding area and had no further  questions.  He was prepared to proceed.   He was taken to the operating room and after satisfactory general  endotracheal anesthesia had been obtained the abdomen was prepped and  draped.  Marcaine 0.25% plain was used for each incision.  The umbilical  incision was made first, the fascia opened, the peritoneal cavity entered  under direct vision.  A pursestring was placed, the Hasson introduced and  the  abdomen insufflated to 15 mmHg.   The patient was placed in reverse Trendelenburg and tilted to the left.  The  10-11 trocars were placed in the epigastrium and two  5 mm trocars laterally.  The gallbladder was tense and distended and a  little bit edematous.  It is retracted up over the liver.  Dissection of  triangle of Calot's and I was able to identify the cystic artery.  I could  see the cystic duct and the cystic artery, but the artery was covered with a  fair amount of fat.  I dissected out the cystic duct so that I could get a  good window directly behind it. I could see the artery curving up and onto  the gallbladder; identified the junction of the gallbladder and cystic duct.  I then clipped it and opened the cystic duct.  A Cooke catheter was  introduced and operative cholangiography done.  The common duct was a little  dilated.  We felt the hepatic radicals and did not see any filling defects,  but there was very slow movement of the duodenum.  On the first run we did  not see any, and we gave the patient some Glucagon.  On the second run with  several more cc's of injected contrast we got a little trickle through.  We  did not see anything to suggest stones, and thought this most likely  represented spasm.  The films were reviewed by Dr. Lyman Bishop, who thought  also this represented more likely spasm than stone.   At this point, we removed the catheter and put three clips in the stay side  of the cystic duct and divided it.  The cystic artery was dissected out a  little bit better and some of the fat on top of it clipped to make sure we  did not have a little bleeder on that, so we could get it dissected out  better.  It was then clipped and divided, leaving two clips on the stay  side.  The gallbladder was removed from below to above.  Another posterior  branch was clipped.  The gallbladder was fairly friable and really peeled  out of the liver, taking a little bit of the  backwall of the gallbladder,  took some of the capsule off of the liver and peritoneum; so, once we had  the gallbladder disconnected we irrigated.  It also spilled some bile out  the cystic duct and because it was thin-walled leaked a little bit as we  were working on it.  We irrigated with at least one liter of fluid  initially, then we stopped and made sure the oozing area along the bed of  the gallbladder was dry.  There was a fairly raw surface there, but it was  all dry and there was no leakage of either blood or bile.  I did leave some  Surgicel in place and at the end placed a 19 Blake drain.   Once everything appeared to be dry, I brought the gallbladder out the  umbilical port (having put it in a bag).  We re-insufflated and  re-  irrigated, made sure we got as much of the bile spill as possible.  Rechecked to make sure there was no bleeding, and the gallbladder bed  appeared dry.  The Blake drain was then placed and secured with a suture  coming out one of the lateral ports.   The abdomen was deflated and the trocars removed under direct vision.  The  pursestring was tied down and closed at the umbilical port.  The skin was  closed with 4-0 Monocryl subcuticular and Dermabond.  The patient tolerated  the procedure well.  There were no operative complications.  All counts were  correct.                                               Currie Paris, M.D.    CJS/MEDQ  D:  11/23/2002  T:  11/23/2002  Job:  [id number]

## 2010-06-28 NOTE — Discharge Summary (Signed)
[place]  Patient:    Trevor, [name]                     MRN: [id number] Adm. Date:  [id number] Disc. Date: [id number] Attending:  Talitha Givens Dictator:   Joellyn Rued, P.A.C. CC:         Juluis Mire, M.D.             Bruce Elvera Lennox Juanda Chance, M.D. [facility]                           Discharge Summary  DATE OF BIRTH:  17-Oct-1955  HISTORY OF PRESENT ILLNESS:  [name] is a 55 year old white male who has a history of prior inferior myocardial infarction with right coronary artery lesion, treated with angioplasty in 1995.  Re-look catheterization in 1996 and 1997 revealed scattered 20 to 30% lesions in the left main left anterior descending artery, circumflex, and 70% lesion in his mid right.  Ejection fraction was 55%. He describes his discomfort as being unpredictable and is noted to include his ack and shoulders.  LABORATORY DATA:  None on the chart.  Chest x-ray does not show any active disease. EKG showed normal sinus rhythm.  HOSPITAL COURSE:  [name] is admitted to unit 2000.  Overnight he did not have any further chest discomfort.  According to the progress notes enzymes and troponins were negative for myocardial infarction.  Hemoglobin and hematocrit was 15.5 and 43.3, platelets 240, white blood cell count 9.7.  PT 10.1.  Sodium 138, potassium 3.7.  BUN 14, creatinine 1.0, glucose 96, TSH 1.66.  He underwent a stress Cardiolite on 3/14.  He ambulated into stage IV, a total of 10 minutes and 56 seconds.  Achieving a total heart rate of 157.  Chief complaints were his legs giving out and he was short of breath.  He did not have any chest discomfort, nor were there any EKG changes.  Imaging showed an ejection fraction of 42%, no ischemia.  He did have some inferior infraseptal scarring versus attenuation. Films were reviewed with Dr. Eden Emms.  It was felt that he could be discharged home with the diagnosis of non-cardiac  chest discomfort.  DISPOSITION:  He is discharged home on aspirin 325 mg q.d., vitamin E 400 units  q.d., metoprolol 25 mg b.i.d., sublingual nitroglycerin p.r.n., Prevacid 30 mg .d.  DIET:  Asked to maintain a low salt, fat, cholesterol diet.  Discontinue smoking.  FOLLOWUP:  The office will call him to make follow up appointment. DD:  04/24/99 TD:  04/24/99 Job: [id number]

## 2010-06-28 NOTE — Op Note (Signed)
NAME:  [name], [name]                        ACCOUNT NO.:  [id number]   MEDICAL RECORD NO.:  [id number]                   PATIENT TYPE:  INP   LOCATION:  0365                                 FACILITY:  [facility]   PHYSICIAN:  Lina Sar, M.D. [facility]               DATE OF BIRTH:  September 25, 1955   DATE OF PROCEDURE:  11/24/2002  DATE OF DISCHARGE:                                 OPERATIVE REPORT   PROCEDURE:  ERCP and upper endoscopy.   INDICATIONS FOR PROCEDURE:  This 55 year old white male was admitted with  acute abdominal pain, history of alcohol use, and radiographic evidence of  cholelithiasis.  He has elevated amylase, lipase, but normal liver function  tests.  He underwent laparoscopic cholecystectomy yesterday and continued to  have abdominal pain this morning with rise in the bilirubin to 4.4.  He is  now undergoing a GI consultation and ERCP to assess the common bile duct  which on intraoperative cholangiogram showed some distal spasm versus stone.   ENDOSCOPE:  Olympus side-viewing single-channel duodenal scope and Olympus  single-channel video gastroscope.   SEDATION:  1. Versed 11 mg IV.  2. Fentanyl 125 mcg IV.  3. Glucagon 0.5 mg IV.  4. Cetacaine spray 2 sprays.   FINDINGS:  Olympus single channel side-viewing duodenoscope passed blindly  through the esophagus into the stomach.  There was a large amount of  retained food in the stomach.  Pylorus was wide open.  There were at least  two shallow ulcerations within the pyloric channel.  Endoscope passed into  the duodenum and showed duodenal diverticulum.  It was initially thought to  be a lumen, and endoscope could not pass by it.  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  We withdrew the  video endoscope and inserted a forward-viewing video endoscope.  At that  point, I was able to see the postbulbar duodenum which showed no evidence of  stricture.  We took biopsies for CLOtest  from the area of the pyloric  ulcers.  Olympus single channel side-viewing duodenoscope was again  reinserted, and duodenum was intubated.  Endoscope at this time passed into  the descending duodenum with easy location of the papillae.  Papillae  appeared somewhat patulous, had a wide opening, and some bowels exiting from  it.  It was cannulated without difficulty, and main pancreatic duct was  selectively avoided.  Only the common bile duct filled, and it showed  essentially normal size with smooth tapering distally.  Fluoroscopic  guidance was provided by Dwyane Luo. Fischer, M.D.  Repeated injections with the  guidewire into the common bile duct showed smooth tapered appearance of the  common bile duct which opened and emptied normally.  There was no evidence  of stone.  For that reason, sphincterotomy was not carried out.  It was felt  that the patulous papillae was due to  edema, possibly related to the  pancreatitis.  Again, the main pancreatic duct was not filled.  The  endoscope was then retracted and stomach decompressed.  The patient  tolerated the procedure well.   IMPRESSION:  1. Gastric retention.  2. Normal common bile duct with spasm versus edema.  3. Pyloric ulcers, status post CLOtest.  4. Ampullae diverticulum.   PLAN:  The patient will be continued on bowel rest, and we will follow his  pancreatic enzymes as pancreatitis which may be alcohol related.  He will be  hydrated and most likely we will proceed with CT scan of the abdomen to  follow up on his pancreatitis.                                               Lina Sar, M.D. [facility]    DB/MEDQ  D:  11/24/2002  T:  11/24/2002  Job:  [id number]

## 2010-06-28 NOTE — Cardiovascular Report (Signed)
[place]. [facility]  Patient:    EBER, FERRUFINO                     MRN: [id number] Proc. Date: 07/24/00 Adm. Date:  [id number] Attending:  Lenoria Farrier CC:         Cardiopulmonary Laboratory   Cardiac Catheterization  PROCEDURES PERFORMED:  Cardiac catheterization and stent implantation.  INDICATIONS:  The patient is 55 years old and had a diaphragmatic wall infarction in 1995, treated with primary angioplasty.  Unfortunately, he has continued to smoke.  Recently, he developed recurrent chest pain and underwent a Cardiolite scan which suggested inferior ischemia.  We brought him in for evaluation with angiography.  DESCRIPTION OF PROCEDURE:  The procedure was performed via the right femoral artery using an arterial sheath and 6 French preformed coronary catheters.  A front wall arterial puncture was performed and Omnipaque contrast was used. After completion of the diagnostic study, we made a decision to proceed with intervention of the right coronary artery.  The patient was consented and enrolled in the REPLACE trial and was randomized either to Angiomax or unfractionated heparin with Integrilin.  We used a 6 [place] JR4 guiding catheter and short BMW wire.  We crossed the lesion in the proximal right coronary artery without much difficulty.  We direct stented with a 3.5 x 18 mm Penta stent.  We deployed this with one inflation of 8 atmospheres for 30 seconds.  We only went to 8 atmospheres because we thought the stent was oversized for the vessel if we went higher.  We then post-dilated with a 3.5 x 15 mm Quantum Ranger performing one inflation of 12 atmospheres for 30 seconds.  Repeat diagnostic studies were then performed through the guiding catheter.  The patient tolerated the procedure well and left the laboratory in satisfactory condition.  RESULTS:  The left main coronary artery:  The left main coronary artery was free of significant  disease.  Left anterior descending:  The left anterior descending artery gave rise to three septal perforators in the diagonal branch.  There was 40% narrowing in the mid LAD just after the diagonal branch.  There was 50% ostial narrowing in the diagonal branch.  Circumflex artery:  The circumflex artery gave rise to a large marginal branch and a small AV branch.  These vessels were free of significant disease.  Right coronary artery:  The right coronary is a moderately large vessel that gave rise to a right ventricular branch, posterior descending branch and two posterolateral branches.  There was 80% narrowing in the mid vessel with some hypodensity.  There was 40% narrowing in the distal vessel.  There was 50% narrowing in the proximal portion of the posterior descending branch and 40% narrowing in the AV portion of the right coronary artery after the posterior descending branch.  LEFT VENTRICULOGRAM:  The left ventriculogram performed in the RAO projection showed hypokinesis of the mid inferior wall.  The overall wall motion was good with an estimated ejection fraction of 55%.  Following stenting of the proximal right coronary artery lesion, the stenosis improved from 80% to 0%.  The aortic pressure was 127/84 and the left ventricular pressure was 127/18.  CONCLUSIONS: 1. Coronary artery disease, status post prior diaphragmatic myocardial    infarction treated with percutaneous transluminal coronary angioplasty of    the right coronary artery in 1995 with 80% narrowing in the proximal    right coronary artery, 40% narrowing  in the distal right coronary artery    and 40% narrowing in the posterior descending branch of the right coronary    artery, no major obstruction in the circumflex artery, 40% narrowing in the    mid left anterior descending artery with 50% narrowing in the diagonal    branch and mild inferior wall hypokinesis. 2. Successful stent deployment in the proximal  right coronary artery with    improvement in percent diameter narrowing from 80% to 0%.  DISPOSITION:  The stent might have been slightly oversized for the vessel and the patient had some residual pain postprocedure.  We will plan to monitor him in the postangioplasty unit and plan discharge tomorrow if he remains stable. DD:  07/24/00 TD:  07/24/00 Job: [id number]

## 2010-06-28 NOTE — H&P (Signed)
NAME:  [name], [name] NO.:  [id number]   MEDICAL RECORD NO.:  [id number]          PATIENT TYPE:  INP   LOCATION:  1824                         FACILITY:  [facility]   PHYSICIAN:  Lonia Blood, M.D.       DATE OF BIRTH:  04/25/1955   DATE OF ADMISSION:  03/14/2006  DATE OF DISCHARGE:                              HISTORY & PHYSICAL   PRIMARY CARE PHYSICIAN:  [name], M.D. at [facility] which  makes the patient unassigned for [facility].   CHIEF COMPLAINT:  Abdominal pain.   HISTORY OF PRESENT ILLNESS:  [name] is a 55 year old gentleman with a  history of coronary artery disease and a cholecystectomy who presented  to [facility] emergency room on March 12, 2006, with complaints of new  onset abdominal pain.  The patient was evaluated, found to have a  dilated common bile duct, an elevated total bilirubin, and a  temperature.  He was discharged home with diet and conservative care.  Today, the results of the blood cultures came back positive for  Escherichia coli and the patient was called back to the hospital.  He  reports that he continues to have abdominal pain in the epigastric area  but the pain has gotten better.  The pain is a knife-like feeling,  constant, and it is interfering with his activities of daily living.  He  also has nausea but he did not vomit.  The patient denies any shortness  of breath, diaphoresis, or cough.   PAST MEDICAL HISTORY:  1. Coronary artery disease status post PTCA to the right coronary      artery in 1995, and stent in the right coronary artery in 2002.  2. Obstructive sleep apnea on CPAP.  3. Cholecystectomy complicated by subhepatic abscess in 2004.  4. Hernia repair.  5. Appendectomy.  6. Gastroesophageal reflux disease.   HOME MEDICATIONS:  1. Aspirin 325 mg daily.  2. Metoprolol 50 mg daily.   ALLERGIES:  THE PATIENT HAS NO KNOWN DRUG ALLERGIES.   SOCIAL HISTORY:  The patient is married,  works as a Dentist.  He has two children.  He smokes a pack and a half a cigarettes a day.  He drinks about 2-5 beers a day.   FAMILY HISTORY:  Positive for coronary artery disease in his father.  There is no family history of cancer.   REVIEW OF SYSTEMS:  As per HPI.  All other systems are negative.   PHYSICAL EXAMINATION:  VITAL SIGNS:  Upon admission show temperature  100.5, blood pressure 127/85, pulse 117, respirations 20, saturation 95%  on room air.  GENERAL APPEARANCE:  Well-developed, well-nourished, Caucasian gentleman  in no acute distress sitting on the stretcher.  He is alert and oriented  to place, person, and time with good memory intact.  Calm and  cooperative.  HEENT:  His head is normocephalic, atraumatic.  Eyes show pupils equal  and round, react to light and accommodation.  Sclerae have icterus.  Conjunctivae are pink.  Throat is clear.  NECK:  Supple.  No  JVD.  No carotid bruits.  CHEST:  Clear to auscultation bilaterally without wheezes, rhonchi or  crackles.  HEART:  Regular rate and rhythm without murmurs, rubs or gallops.  ABDOMEN:  Soft.  There is a tenderness in epigastric area.  No rebound,  no guarding.  LOWER EXTREMITIES:  No edema.  SKIN:  Warm and dry without any suspicious rashes.  NEUROLOGIC:  The patient's gait is normal.  Sensation is intact.  Strength 5/5 in all four extremities.   LABORATORY VALUES:  On admission, white blood cell count 11.4,  hemoglobin 14.5, platelet count is 120.  Sodium 137, potassium 3.6, BUN  23, creatinine 1, albumin 2.7, AST 25, ALT 151, alkaline phosphatase is  132, total bilirubin 2.4.   ASSESSMENT/PLAN:  1. Abdominal pain most likely secondary to acute cholangitis.  The      patient is already improving which means that he may have passed a      common bile duct stone.  The plan is to admit the patient to acute      care unit of [facility].  Repeat imaging of the abdomen      with commuted  tomography scan of the abdomen to reassess the size      of the common bile duct.  Gastroenterology consultation with Dr.      Wendall Papa was obtained for an ERCP in the morning.  2. Escherichia coli bacteremia secondary to acute cholangitis.  The      patient will be treated with 1 week of intravenous antibiotics and      we will start the Unasyn while he is here in the hospital.  The      plan is for outpatient antibiotic therapy to be done after the      diagnostic procedure.  3. History of coronary artery disease.  For now, I will place the      patient on telemetry, obtain three sets of cardiac enzymes, and      continue his aspirin and metoprolol after the invasive procedure.      If the patient rules in for myocardial infarction, we will call in      a cardiology consultation.  At this point in time, my suspicion for      an acute coronary syndrome is very low.  4. Obstructive sleep apnea.  We will continue the patient on CPAP here      in the hospital.  5. Tobacco abuse.  I have counseled the patient about quitting.  I am      going to provide him with a nicotine patch.      Lonia Blood, M.D.  Electronically Signed     SL/MEDQ  D:  03/14/2006  T:  03/14/2006  Job:  [id number]   cc:   Rachael Fee, MD  [name], M.D.

## 2010-06-28 NOTE — H&P (Signed)
NAME:  [name], [name]                        ACCOUNT NO.:  [id number]   MEDICAL RECORD NO.:  [id number]                   PATIENT TYPE:  INP   LOCATION:  0446                                 [facility]ACILITY:  [facility]   PHYSICIAN:  Currie Paris, M.D.           DATE OF BIRTH:  10-17-1955   DATE OF ADMISSION:  12/02/2002  DATE OF DISCHARGE:                                HISTORY & PHYSICAL   CHIEF COMPLAINT:  Abdominal pain.   HISTORY OF PRESENT ILLNESS:  This is an interval admission on [name],  who was just discharged from the hospital about a week ago.  He had been  admitted urgently for laparoscopic cholecystectomy, having had what was  thought to be a biliary pancreatitis.  At the time of surgery his amylase  returned to normal.  At the time of surgery he had a fairly narrowed distal  common bile duct thought secondary to edema.  The day after surgery, he had  a marked elevation of his liver functions with __________up to 4 and  underwent ERCP, which was basically negative for any retained stone, and all  the changes were thought secondary to edema.  He went home on November 25, 2002.   He does note that he felt very well right after his laparoscopic  cholecystectomy and the next morning, but after his ERCP he began developing  some abdominal pain which was intermittent but held on through the week.  Two or three days prior to admission he started running fevers, and on  December 01, 2002, was seen by me in the office.  At that time he was not  feeling well and having a little bit of right upper quadrant pain.  His  white count was noted to be 12,000.  Liver functions returning towards  normal but still slightly elevated.  A CT scan was scheduled for December 02, 2002, and that showed a fluid collection in the gallbladder fossa, possible  abscess.  Because of these symptoms and findings on CT, he was admitted for  further workup.   PAST HISTORY, FAMILY HISTORY, SOCIAL  HISTORY:  Unchanged from his admission  one week ago.   PHYSICAL EXAM:  GENERAL:  The patient is alert and oriented.  In no  distress.  VITAL SIGNS:  He is afebrile.  HEENT:  Eyes nonicteric.  NECK:  Supple.  No masses or thyromegaly.  LUNGS:  Clear to auscultation.  HEART:  Regular.  ABDOMEN:  Completely benign, with no masses, tenderness, or organomegaly  noted.   IMPRESSION:  Fluid collection, possible abscess gallbladder fossa.   PLAN:  IV fluids and continued observation with repeat CT scan scheduled for  Monday.  Currie Paris, M.D.    CJS/MEDQ  D:  12/03/2002  T:  12/03/2002  Job:  [id number]

## 2010-06-28 NOTE — H&P (Signed)
[place]. [facility]  Patient:    [name], [name]                     MRN: [id number] Adm. Date:  [id number] Attending:  Talitha Givens Dictator:   Dian Queen, P.A.C. [facility] CC:         Juluis Mire, M.D.                         History and Physical  HISTORY OF PRESENT ILLNESS:  Mr. 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  He has known coronary disease per prior RCA MI in 1995 treated with urgent angioplasty.  He had a relook catheterization in 1996 and 1997. On both occasions, he was treated medically.  He was last hospitalized in February of this year with chest pain.  He was recathed and found to have scattered 20 to 30% lesions in his left main, LAD, and circumflex and a 70% lesion in his mid right. His ejection fraction was perserved at 55%.  He is admitted now with recurrent chest pain which is somewhat atypical and unpredictable.  He does have some back and shoulder pain with this.  PAST MEDICAL HISTORY:  He has has shoulder and knee surgery.  He had an appendectomy and herniorrhaphy.  He has had ankle problems in the past.  REVIEW OF SYSTEMS:  No change in the bowel habits, melena, hematochezia.  No GU  symptoms.  Weight has been stable.  FAMILY HISTORY:  Positive for coronary disease.  His father had CABG at age 45 nd he has a paternal grandmother who had coronary disease.  ALLERGIES:  No known drug allergies.  PHYSICAL EXAMINATION:  GENERAL:  A pleasant, alert and oriented gentleman.  VITAL SIGNS:  Blood pressure 130/80 in sinus rhythm.  NECK:  Carotid pulses without bruits, no JVD.  HEENT: Extraocular muscles intact.  Sclerae non-icteric.  Conjunctivae injected.  LUNGS:  Clear to P&A bilaterally.  HEART:  Sinus rhythm.  S1 and S2 normal.  S4 present.  No significant murmur.  ABDOMEN:  Soft without masses,  hepatosplenomegaly, bruits.  EXTREMITIES:  Active pedal pulses with no edema.  NEUROLOGICAL EXAM:  Normal.  IMPRESSION: 1. Coronary artery disease with prior right coronary artery angioplasty in 1995,    okay at relook in 1996, 1997 and at last look February 2000 when he a 70% right    with scattered 20 to 30% lesions in the left coronary system - medical therapy    at that time was recommended.  He is admitted now with recurrent chest pain.  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 3. Ongoing cigarette use. 4. Family history of coronary artery disease.  DISPOSITION:  The patient is admitted for further evaluation.  Will proceed with rest/stress Cardiolite and then make a decision regarding the need for further cardiac catheterization and evaluation. DD:  04/23/99 TD:  04/23/99 Job: [id number]

## 2010-06-28 NOTE — Discharge Summary (Signed)
NAMEHELIODORO, DOMAGALSKI              ACCOUNT NO.:  [id number]   MEDICAL RECORD NO.:  [id number]          PATIENT TYPE:  INP   LOCATION:  1609                         FACILITY:  [facility]   PHYSICIAN:  Georges Lynch. Gioffre, M.D.DATE OF BIRTH:  04-Jun-1955   DATE OF ADMISSION:  11/09/2007  DATE OF DISCHARGE:  11/11/2007                               DISCHARGE SUMMARY   ADMISSION DIAGNOSES:  1. Hypertension.  2. Sleep apnea.  3. Bone cyst, distal femur.  4. Meniscus tear, left knee.  5. History of coronary artery disease with past myocardial infarction.   HISTORY OF PRESENT ILLNESS:  The patient is 55 year old gentleman  evaluated for left knee pain over the medial femoral condyle.  He states  over several years previously he has had problems off and on with his  knees.  Had a scope 15 years previous.  He did know that he had no  injuries, but he started developing some pain and swelling over the  medial aspect of the knee of the medial femoral condyle region.  Evaluation with x-ray shows a large cyst.  An MRI was performed.  It  shows a large cyst with cortical disruption and was also found to have  some meniscal degenerative tearing.  The patient and Dr. Darrelyn Hillock  discussed the issues, and he will go ahead with surgery for debridement  of that bone cyst and evaluation of the meniscus.   MEDICATIONS ON ADMISSION:  1. Metoprolol 50 mg twice a day.  2. Vicodin 1 or 2 tablets every 4-6 hours as needed.  3. Aspirin 325 mg a day.   ALLERGIES:  Adhesive tape.   SURGICAL PROCEDURE:  On November 09, 2007, the patient was taken to the  OR by Dr. Worthy Rancher, assisted by the surgical nurse.  Under general  anesthesia the patient underwent an arthrotomy of the left knee,  curettement of large ganglion cyst and bone grafting with allograft  material in the defect, and exploration of the medial meniscus.  There  were no complications.  The patient was transferred to the recovery room  and then to  the orthopedic floor in good condition.   CONSULTS:  Routine physical therapy and case management.   HOSPITAL COURSE:  On November 09, 2007, the patient was admitted to  [facility] under the care of Dr. Ranee Gosselin.  The patient  was taken to the OR where an arthrotomy of the left knee was performed  over the medial aspect, curettement of a large ganglion cyst and bone  grafting with allograft material in the defect was performed, and  exploration of the medial meniscus was also performed.  The patient  tolerated the procedure well.  The patient was transferred to the  recovery room and then to the orthopedic floor in good condition where  the patient then incurred 2-day postoperative course.  The patient was  able to wean off IV medications well.  The patient's wound remained  benign for any signs of infection.  The patient was placed on Coumadin  for DVT prophylaxis.  The patient was able to ambulate with 50%  weightbearing with use of physical therapy.  The patient did not have  any complications postoperatively while he was in the hospital.  The  patient was discharged on postop day #2 with outpatient physical therapy  arrangements and Coumadin monitoring through [facility].   LABS:  CBC on admission found WBCs 10.0, hemoglobin 16.6, hematocrit  49.0, platelets 243.  INR on day of discharge was 1.3.  Routine  chemistries on admission were within normal limits except for a glucose  of 133.  Estimated GFR was greater than 60.  EKG on admission was normal  sinus rhythm at 63 beats per minute.  Chest x-ray showed no active  cardiopulmonary disease.   Pathology report of curettings from the left distal femur, findings  consistent with a ganglion cyst.   DISCHARGE INSTRUCTIONS:  1. Diet:  No restrictions.  2. Activity:  The patient is to maintain 50% weightbearing with the      use of a walker.  3. Wound care:  The patient is to change his dressing on a daily       basis.   FOLLOWUP:  1. The patient has a followup appointment with Dr. Darrelyn Hillock in the      office in 1 week from date of surgery.  Patient is to call [phone]      for that followup appointment.  2. Home healthcare for physical therapy and Coumadin monitoring      through [facility].   MEDICATIONS:  1. Percocet 10/650 one tablet every 4-6 hours for pain if needed.  2. Coumadin 5 mg a day unless changed by Genevieve Norlander pharmacist.  3. Metoprolol 50 mg 1 tablet twice a day.  4. Vicodin on hold.  5. Aspirin on hold until done with Coumadin.   The patient's condition upon discharge to home is improved.      Jamelle Rushing, P.A.    ______________________________  Georges Lynch Darrelyn Hillock, M.D.    RWK/MEDQ  D:  11/24/2007  T:  11/24/2007  Job:  [id number]

## 2010-06-28 NOTE — H&P (Signed)
[place]. [facility]  Patient:    [name], [name]                     MRN: [id number] Adm. Date:  [id number] Attending:  Lenoria Farrier Dictator:   Abelino Derrick, P.A.C. [facility]                         History and Physical  HISTORY OF PRESENT ILLNESS:  Mr. Craine is a 55 year old male followed by Dr. Juanda Chance. He really does not have a primary care doctor, he goes to [facility]  He has a history of coronary disease and had DMI in 1995 treated with PTCA. He was cathed in March of 2000 and had nonobstructive coronary disease and a negative cardiolite. Recently he has had some more chest pain and apparently he had a cardiolite as an outpatient that was abnormal and he was admitted for same day catheterization. The patient describes fatigue, neck discomfort and nausea.  PAST MEDICAL HISTORY:  Remarkable for occult sleep apnea, he uses CPAP at home. He is status post hernia repair and appendectomy and he has left ankle pinning.  MEDICATIONS:  Lopressor 25 mg a day, aspirin q.d., Prevacid. He has no known drug allergies.  SOCIAL HISTORY:  He is a one to two day pack a day smoker. 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  FAMILY HISTORY:  Positive for coronary disease, his father had a bypass at 47 years old. He died in his 58s.  REVIEW OF SYSTEMS:  Unremarkable except for as noted above, he has had a history of prostatitis and saw Dr. Elige Radon several years ago.  PHYSICAL EXAMINATION:  VITAL SIGNS:  Blood pressure 140/80, pulse 59, respirations 18.  GENERAL:  He is well-developed, well-nourished male in no acute distress.  HEENT:  Normocephalic. Extraocular movements intact. Sclera is nonicteric. Lids and conjunctiva within normal limits.  NECK:  Without bruits and without JVD.  CHEST:  Clear to auscultation and percussion.  CARDIAC:  Reveals a regular rate and rhythm without murmurs, rubs or gallops. Normal S1,  S2.  ABDOMEN:  Nontender, no hepatosplenomegaly.  EXTREMITIES:  Without edema. Distal pulses are intact.  NEUROLOGIC:  Grossly intact. He is awake, alert, oriented and cooperative.  IMPRESSION: 1. Exertional neck discomfort and fatigue with abnormal cardiolite. Rule out    progression of coronary disease. 2. DMI treated with RCA angioplasty in 1995. 3. History of heavy smoking. 4. Occult sleep apnea, on CPAP at home. 5. Family history of coronary disease.  PLAN:  The patient is to be admitted by Dr. Juanda Chance for same day catheterization and further evaluation. DD:  07/24/00 TD:  07/24/00 Job: [id number]

## 2010-06-28 NOTE — Discharge Summary (Signed)
[place]. [facility]  Patient:    [name], [name]                     MRN: [id number] Adm. Date:  [id number] Disc. Date: [id number] Attending:  Lenoria Farrier Dictator:   Vania Rea. Rinehuls, PA CC:         [facility]   Discharge Summary  HISTORY ON ADMISSION:  This is a 55 year old male with a history of a PTCA in 1995.  He was admitted to [facility] on July 24, 2000 for evaluation of nausea and fatigue.  He had an abnormal Cardiolite performed recently.  Plans were for repeat cardiac catheterization.  PAST MEDICAL HISTORY:  Obstructive sleep apnea.  ALLERGIES:  No known drug allergies.  SOCIAL HISTORY:  The patient is married.  He smokes two packs of cigarettes per day.  HOSPITAL COURSE:  As noted, this patient was admitted to [facility] on July 24, 2000 for cardiac catheterization, secondary to history of coronary artery disease with recent symptomatology and recent positive Cardiolite.  He was found to have an 80% RCA lesion at the time of catheterization.  A PTCA stent was performed, reducing it to 0%.  He had a residual 40% mid LAD, as well as a 50% diagonal, a 40% distal RCA, 50% PD, 40% PL.  There was mild inferior hypokinesis; ejection fraction 55%.  Arrangements were made to discharge the patient in improved condition the following day.  LABORATORY DATA:  Basic Metabolic Panel (on day of discharge) -- Within normal limits, except for glucose of 116.  CK-MB enzymes were negative.  Cholesterol profile:  Cholesterol 165, triglycerides 186, HDL 29, LDL 99.  Cardiac enzymes were negative.  EKG:  Showed sinus bradycardia, at a rate of 58 beats per minute; but was otherwise normal.  CBC: (In the office) Was within normal limits.  BASIC METABOLIC PANEL:  (In the office) Was within normal limits.  DISCHARGE MEDICATIONS: 1. Plavix 75 mg q.d. for four weeks. 2. Wellbutrin 100 mg  q.d. 3. Lipitor 20 mg q.d. 4. Enteric-coated aspirin 325 mg q.d. 5. Nitroglycerin p.r.n. chest pain. 6. Toprol XL 50 mg q.d. (instead of Lopressor, as previously taken) 7. Prevacid as previously taken.  SPECIAL INSTRUCTIONS:  The patient is told to avoid any strenuous activity or driving for two days.  He was told not to lift more than 10 pounds for two weeks.  He was to be on low-salt, low-fat diet.  He was told to call the office if he had any increased pain, swelling or bleeding from his groin.  he was told to quit smoking.  He was to have liver function tests at his next office visit, as Lipitor was started this admission.  He was to follow up with Dr. Juanda Chance in the office in approximately two weeks.  He was to see his family physician as needed or as scheduled.  PROBLEMS AT TIME OF DISCHARGE: 1. Status post PTCA stent of the RCA, performed July 24, 2000. 2. History of tobacco abuse. 3. History of PTCA in 1995. 4. History of obstructive sleep apnea. 5. Low HDL (29) treated with Lipitor. 6. Wellbutrin started for smoking cessation. 7. Mildly elevated glucose, question metabolic syndrome. DD:  07/25/00 TD:  07/25/00 Job: [id number]

## 2010-06-28 NOTE — Consult Note (Signed)
[name], [name] NO.:  [id number]   MEDICAL RECORD NO.:  [id number]          PATIENT TYPE:  INP   LOCATION:  6704                         FACILITY:  [facility]   PHYSICIAN:  Rachael Fee, MD   DATE OF BIRTH:  12/13/1955   DATE OF CONSULTATION:  DATE OF DISCHARGE:                                 CONSULTATION   PRIMARY CARE PHYSICIAN:  [name], M.D., at [facility].   ADMITTING PHYSICIAN:  Lonia Blood, M.D.   REASON FOR CONSULTATION:  Dr. Lavera Guise asked me to evaluate [name] in  consultation regarding abnormal liver test, pain, dilated bowel duct.   HISTORY OF PRESENT ILLNESS:  [name] is a very pleasant 55 year old  man who had a laparoscopic cholecystectomy in October 2004 that was  complicated by quite a large subhepatic abscess that was eventually  drained percutaneously.  Since then, he has had discomfort here and  there but never a dramatic biliary type problems again.  Three days ago,  he presented to the [facility] Emergency Room with rather acute onset  abdominal pain, fever, chills, jaundice.  Ultrasound showed he had a  dilated bowel duct.  He had a white count.  He was sent home from the  emergency room with diet and conservative care.  He was called back by  the emergency room team when blood cultures proved that he had E. coli  in his blood.  He was actually feeling somewhat better by the time he  arrived back at the emergency room yesterday.  He still however does  have the pain in the right upper quadrant and some nausea.  When he was  evaluated in the emergency room, he had a temperature of 100.5.  Labs  yesterday showed a white count of 11.4, platelets of 120, normal basic  metabolic profile.  His total bilirubin had decreased to 2.3 (was as  high as 5), and alkaline phos is 132, ALT 151, his AST was normal.  His  INR was normal.  Troponin-I was very slightly elevated at 0.08.  With  his complex biliary history, I recommended a  CT scan be done overnight  in addition to his antibiotics and IV fluids.  The scan has not been  officially read by Radiology, but to my eye there is nothing that looks  like a recurrent abscess.  He does have somewhat of a large bile duct,  and I see an echogenic foci in the distal bile duct.  This is consistent  with a bile duct stone.  I see no masses in his pancreas.  Overnight, he  has felt well.  He was started on Unasyn 3 grams IV q.8h. and made  n.p.o. for likely ERCP this morning.   REVIEW OF SYSTEMS:  Notable for fever, chills, dark urine, no chest  pain, no shortness of breath, mild nausea, but no vomiting, no  hematemesis, no hematochezia.   PAST MEDICAL HISTORY:  1. Coronary artery disease with a PTCA to the right coronary and a      stent in the right coronary in  2002.  2. Obstructive sleep apnea on CPAP.  3. Cholecystectomy complicated by abscess in 2004.  4. Hernia repair.  5. Appendectomy.  6. GERD.   HOME MEDICATIONS:  Aspirin and metoprolol.   ALLERGIES:  No known drug allergies.   SOCIAL HISTORY:  Married, works as a Scientist, product/process development.  Two children.  Drinks two to three beers a day.  Smokes a half a pack of cigarettes a  day.   FAMILY HISTORY:  Coronary artery disease, no cancer in the family.   PHYSICAL EXAMINATION:  Temperature 98.5, pulse 93, blood pressure  128/81, saturating 95% on room air.  Anicteric sclerae.  Alert and oriented x3.  EYES:  Extraocular movements  intact.  MOUTH AND OROPHARYNX:  Moist, no lesions.  NECK:  Supple, no lymphadenopathy.  CARDIOVASCULAR/HEART:  Regular rate and rhythm.  LUNGS:  Clear to auscultation bilaterally.  ABDOMEN:  Soft and mildly tender in the epigastrium.  Bowel sounds  positive, no ascites, no peritoneal signs.  EXTREMITIES:  No lower extremity edema.  SKIN:  No rashes or lesions on visible extremities.   ASSESSMENT AND PLAN:  A 55 year old man with distant complex  cholecystectomy complicated by  subhepatic abscess who now presents with  fever, abnormal liver tests, elevated white count, dilated bile duct  with what looks like an echogenic foci to me on a CT scan.  This is all  consistent with cholangitis probably from a CBD stone.  I am also  concerned that he may have stricturing disease from his complex biliary  disease three to four years ago.   PLAN:  The plan is to keep him on IV antibiotics, IV fluids.  Will plan  for an ERCP this morning.      Rachael Fee, MD  Electronically Signed     DPJ/MEDQ  D:  03/15/2006  T:  03/15/2006  Job:  [id number]   cc:   [name], M.D.

## 2010-06-28 NOTE — Discharge Summary (Signed)
NAMEJEBADIAH, [name]                 ACCOUNT NO.:  [id number]   MEDICAL RECORD NO.:  [id number]          PATIENT TYPE:  INP   LOCATION:  6704                         FACILITY:  [facility]   PHYSICIAN:  Lonia Blood, M.D.      DATE OF BIRTH:  1956/02/04   DATE OF ADMISSION:  03/14/2006  DATE OF DISCHARGE:  03/17/2006                               DISCHARGE SUMMARY   PRIMARY CARE PHYSICIAN:  Dr. Jeannetta Nap.   DISCHARGE DIAGNOSES:  1. Acute cholangitis secondary to E. coli and common bile duct      obstruction.  2. E. coli bacteremia.  3. Morbid obesity.  4. Right renal lobar nephronia or pyelonephritis.  5. Mild diffuse fatty infiltration of the liver.  6. Tobacco abuse.  7. Alcohol abuse.  8. Obstructive sleep apnea with CPAP.  9. A history of coronary artery disease, currently stable.  10.Gastroesophageal reflux disease.  11.Inguinal hernias bilateral.  12.Sigmoid diverticulosis.   DISCHARGE MEDICATIONS:  1. Ciprofloxacin 500 mg daily for 11 more days.  2. Pepcid 20 mg daily.  3. Metoprolol 25 mg to 50 mg daily.  4. Aspirin, change to 81 mg daily.   DISPOSITION:  Patient feels all right at this point.  He is ready to go  home.  He is eating and drinking well without any problem.   PROCEDURES PERFORMED THIS ADMISSION:  Include CT of abdomen and pelvis  on February 3 that showed lower pole right renal lobar nephronia, mild  diffuse radiographic low density involving the majority of the liver,  mild diffuse intrahepatic dilatation was more pronounced dilatation of  the ducts in the posterior aspect of the left lobe, proximal common duct  also mildly dilated.  CT of pelvis shows mild diffuse bladder wall  thickening, possibly cystitis, sigmoid diverticulosis, small bilateral,  proximal inguinal hernias containing fat.  An ERCP on February 3 showed  opacified common bile duct which is dilated, although intrahepatic  biliary appears not significantly dilated.   CONSULTATIONS:  Ulyess Mort, MD, gastroenterology.   BRIEF HISTORY AND PHYSICAL:  Please refer to dictated History & Physical  by Dr. Lonia Blood.  In short, however, this is a 55 year old gentleman  with history of coronary artery disease and also start post  cholecystectomy.  Patient came in with abdominal pain on January 31.  He  was found to have dilated common bile duct and also elevated total  bilirubin.  He was admitted and discharged home with diet and  conservative care.  Patient was at home when his blood cultures came  back showing E. coli bacteremia.  He was subsequently admitted for  further management.  On admission, patient continued to have abdominal  pain, especially in the epigastric area.  He was subsequently admitted  for management of bacteremia as well as acute cholangitis.   HOSPITAL COURSE:  1. Ascending cholangitis.  Patient was started on Unasyn which has      continued up until today.  He showed micro improvement.  Initially,      he was N.P.O. with the pain and nausea.  Gradually,  patient has      responded well.  He had ERCP done as indicated above and the CT of      abdomen and pelvis also performed.  Based on patient's response and      splenectomy that he obtained, he has improved and not eating      adequately, so we will continue with his discharge home on his      regular diet.  2. E. coli bacteremia.  For his bacteremia, patient will be treated      with antibiotics for a total of 2 weeks.  He will continue on Cipro      since sensitivity came back showing the E. coli is sensitive to      most antibiotics.  3. Transient hypokalemia.  This has been repleted effectively.  4. Transient thrombocytopenia.  The patient's platelet dropped to as      low as 130 but currently back to 150.   The rest of his medical problems are essentially stable.   DISCHARGE LABS:  White count 11.3, hemoglobin 14.3, platelets 149, total  bilirubin 1.3, alkaline phosphatase 146, albumin 2.2,  AST 23, ALT 72.      Lonia Blood, M.D.  Electronically Signed     LG/MEDQ  D:  03/17/2006  T:  03/18/2006  Job:  [id number]

## 2010-06-28 NOTE — Discharge Summary (Signed)
   NAME:  [name], [name]                        ACCOUNT NO.:  [id number]   MEDICAL RECORD NO.:  [id number]                   PATIENT TYPE:  INP   LOCATION:  0446                                 FACILITY:  [facility]   PHYSICIAN:  Currie Paris, M.D.           DATE OF BIRTH:  20-Dec-1955   DATE OF ADMISSION:  12/02/2002  DATE OF DISCHARGE:  12/08/2002                                 DISCHARGE SUMMARY   FINAL DIAGNOSIS:  Subhepatic abscess postop cholecystectomy.   CLINICAL HISTORY:  [name] is a 55 year old gentleman, who had previously  undergone an urgent laparoscopic cholecystectomy following an episode of  biliary pancreatitis.  He did well initially.  After surgery, we had a  follow-up ERCP the day after surgery because of finding at the time of  operative cholangiogram of some narrowing.  After that, he developed  abdominal pain but was able to go home.  The patient seen in the office  still having some nausea and low-grade fevers and abdominal CT ordered.  __________ to that was accomplished and was found to have a subhepatic  gallbladder fossa collection.  We initially thought about doing a  percutaneous drainage but because of having to go through the liver, we  decided to defer that for a few days and put him on some antibiotics to see  if that improved the situation.   HOSPITAL COURSE:  The patient was admitted, begun on IV fluids and  antibiotics.  He actually felt much better the day after admission with  minimal pain, no nausea, tolerating diet.  We continued his IV antibiotics,  repeated his CT scan in October 25, which showed persistence of the  collection.  This was then percutaneously drained which he tolerated well.  He was maintained on drainage for a couple of days, on IV antibiotics for a  couple of days, became afebrile and by October 28, he was able to be  discharged.  His drain was taken out.  He was discharged home on antibiotics  for follow-up in our  office.   LABORATORY STUDIES THIS ADMISSION:  Hemoglobin 13.2 with a white count of  9400.  Gallbladder fossa showed multiple organisms, none predominant, both  rods and cocci.                                               Currie Paris, M.D.    CJS/MEDQ  D:  12/15/2002  T:  12/15/2002  Job:  [id number]

## 2010-07-25 ENCOUNTER — Other Ambulatory Visit: Payer: Self-pay | Admitting: *Deleted

## 2010-07-25 MED ORDER — BENAZEPRIL HCL 10 MG PO TABS: 10.0000 mg | ORAL_TABLET | Freq: Every day | ORAL | Status: DC

## 2010-07-25 MED ORDER — SIMVASTATIN 40 MG PO TABS: 40.0000 mg | ORAL_TABLET | Freq: Every day | ORAL | Status: DC

## 2010-08-08 ENCOUNTER — Telehealth: Payer: Self-pay | Admitting: *Deleted

## 2010-08-08 NOTE — Telephone Encounter (Signed)
PT AWARE PLAVIX  LEFT AT FRONT DESK FOR PICK UP./CY

## 2010-08-30 ENCOUNTER — Other Ambulatory Visit: Payer: Self-pay | Admitting: *Deleted

## 2010-08-30 ENCOUNTER — Telehealth: Payer: Self-pay | Admitting: Cardiology

## 2010-08-30 MED ORDER — METOPROLOL SUCCINATE ER 50 MG PO TB24: 50.0000 mg | ORAL_TABLET | Freq: Every day | ORAL | Status: DC

## 2010-08-30 NOTE — Telephone Encounter (Signed)
Pt needs metoprolol 50mg , [facility]

## 2010-09-10 ENCOUNTER — Encounter: Payer: Self-pay | Admitting: Internal Medicine

## 2010-09-10 ENCOUNTER — Ambulatory Visit (INDEPENDENT_AMBULATORY_CARE_PROVIDER_SITE_OTHER): Payer: Medicare Other | Admitting: Internal Medicine

## 2010-09-10 DIAGNOSIS — I2589 Other forms of chronic ischemic heart disease: Secondary | ICD-10-CM

## 2010-09-10 DIAGNOSIS — I10 Essential (primary) hypertension: Secondary | ICD-10-CM

## 2010-09-10 DIAGNOSIS — Z95810 Presence of automatic (implantable) cardiac defibrillator: Secondary | ICD-10-CM

## 2010-09-10 DIAGNOSIS — I5022 Chronic systolic (congestive) heart failure: Secondary | ICD-10-CM | POA: Insufficient documentation

## 2010-09-10 NOTE — Assessment & Plan Note (Signed)
His blood pressures now on the low side. I've asked him to check his blood pressure on a regular basis. If the systolic pressures remain below 100, he is instructed to reduce his dose of Toprol by half.

## 2010-09-10 NOTE — Progress Notes (Signed)
HPI [name] returns today for followup. He is a very pleasant 55 year old man with an ischemic cardiomyopathy, severe left ventricular dysfunction, class II congestive heart failure, who is status post ICD implantation. The patient denies chest pain, syncope, or peripheral edema. He admits to drinking alcohol in excess. He states that he drinks 3-4 beers a day sometimes more. He also continues to smoke a half pack of cigarettes daily. This is down from 2 packs a day previously. The patient's heart failure symptoms remained class II despite maximal medical therapy. He notes that his blood pressure has been a little on the low side although he does not get it checked  that often. Not on File   Current Outpatient Prescriptions  Medication Sig Dispense Refill  . amitriptyline (ELAVIL) 25 MG tablet Take 25 mg by mouth as needed.        [name] aspirin 81 MG tablet Take 81 mg by mouth daily.        . benazepril (LOTENSIN) 10 MG tablet Take 1 tablet (10 mg total) by mouth daily.  30 tablet  6  . clopidogrel (PLAVIX) 75 MG tablet Take 75 mg by mouth daily.        [name] gabapentin (NEURONTIN) 300 MG capsule Take 300 mg by mouth as needed.        . metoprolol (TOPROL-XL) 50 MG 24 hr tablet Take 1 tablet (50 mg total) by mouth daily.  30 tablet  12  . nitroGLYCERIN (NITROSTAT) 0.4 MG SL tablet Place 0.4 mg under the tongue every 5 (five) minutes as needed. May repeat 3 times.       . NON FORMULARY IRIS STUDY - Use as directed       . simvastatin (ZOCOR) 40 MG tablet Take 1 tablet (40 mg total) by mouth at bedtime.  30 tablet  6     Past Medical History  Diagnosis Date  . CAD (coronary artery disease)     status post diaphragmatic wall infarction in 1995, treated with percutaneous transluminal coronary  angioplasty with subsequent stenting of the right coronary artery in 2002.   [name] Hyperlipidemia   . Hypertension     Intolerant to MULTIPLE CHOLESTEROL MEDICATIONS.  [name] GERD (gastroesophageal reflux disease)   .  Status post cholecystectomy   . PFO (patent foramen ovale)     ROS:   All systems reviewed and negative except as noted in the HPI.   Past Surgical History  Procedure Date  . Ptca     stent of the RCA 07/24/00  . Hernia repair   . Appendectomy   . Cardiac defibrillator placement     [employer]  . Ankle surgery   . Cholecystectomy 12/02/02     Family History  Problem Relation Age of Onset  . Coronary artery disease       History   Social History  . Marital Status: Married    Spouse Name: N/A    Number of Children: N/A  . Years of Education: N/A   Occupational History  . Not on file.   Social History Main Topics  . Smoking status: Smoker, Current Status Unknown  . Smokeless tobacco: Not on file  . Alcohol Use: Not on file  . Drug Use: Not on file  . Sexually Active: Not on file   Other Topics Concern  . Not on file   Social History Narrative  . No narrative on file     BP 94/56  Pulse 88  Ht 6'  1" (1.854 m)  Wt 216 lb (97.977 kg)  BMI 28.50 kg/m2  Physical Exam:  Well appearing NAD HEENT: Unremarkable Neck:  No JVD, no thyromegally Lymphatics:  No adenopathy Back:  No CVA tenderness Lungs:  Clear except for rare basilar rales. No wheezes or rhonchi are present. Well-healed ICD incision. HEART:  Regular rate rhythm, no murmurs, no rubs, no clicks. PMI is enlarged and laterally displaced. Abd:  soft, positive bowel sounds, no organomegally, no rebound, no guarding Ext:  2 plus pulses, no edema, no cyanosis, no clubbing Skin:  No rashes no nodules Neuro:  CN II through XII intact, motor grossly intact  DEVICE  Normal device function.  See PaceArt for details.   Assess/Plan:

## 2010-09-10 NOTE — Assessment & Plan Note (Signed)
He denies anginal symptoms. He will continue his current medical therapy. I have counseled him on stopping smoking.

## 2010-09-10 NOTE — Assessment & Plan Note (Signed)
His device is working normally. We'll plan to recheck in several months. 

## 2010-09-10 NOTE — Assessment & Plan Note (Signed)
His symptoms remain class II. He will continue his current medical therapy. I have asked that he stop drinking alcohol.

## 2010-10-16 ENCOUNTER — Encounter: Payer: Self-pay | Admitting: Cardiology

## 2010-10-16 ENCOUNTER — Ambulatory Visit (INDEPENDENT_AMBULATORY_CARE_PROVIDER_SITE_OTHER): Payer: Medicare Other | Admitting: Cardiology

## 2010-10-16 VITALS — BP 116/78 | HR 78 | Resp 14 | Ht 73.0 in | Wt 215.0 lb

## 2010-10-16 DIAGNOSIS — Z95810 Presence of automatic (implantable) cardiac defibrillator: Secondary | ICD-10-CM

## 2010-10-16 DIAGNOSIS — I2589 Other forms of chronic ischemic heart disease: Secondary | ICD-10-CM

## 2010-10-16 DIAGNOSIS — E785 Hyperlipidemia, unspecified: Secondary | ICD-10-CM

## 2010-10-16 DIAGNOSIS — F17200 Nicotine dependence, unspecified, uncomplicated: Secondary | ICD-10-CM

## 2010-10-16 DIAGNOSIS — I10 Essential (primary) hypertension: Secondary | ICD-10-CM

## 2010-10-16 DIAGNOSIS — I2510 Atherosclerotic heart disease of native coronary artery without angina pectoris: Secondary | ICD-10-CM

## 2010-10-16 DIAGNOSIS — I6350 Cerebral infarction due to unspecified occlusion or stenosis of unspecified cerebral artery: Secondary | ICD-10-CM

## 2010-10-16 NOTE — Progress Notes (Signed)
HPI: [name] is pleasant gentleman for f/U of CM, CAD and prior CVA. Previously followed by [name]. 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 In 2002 he had a bare-metal stent to the right coronary artery. He was admitted with a stroke with symptoms of confusion and ataxia in Sept of 2011. He had a TEE which showed an ejection fraction of 30-35% with no evident thrombus. 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 His MRI showed multiple posterior circulation infarct. Because his LV function was worse he underwent cardiac catheterization in January of 2012. This revealed 25 LM. There is a 50-60% stenosis of the mid-LAD. There is a diagonal branch arising from this area with ostial 60-70% stenosis. There is an ostial 50% stenosis of the circumflex/intermediate noted. There is a patent stent in the RCAmidvessel with 30-40% in-stent restenosis. The proximal and distal vessel both have 50% stenosis noted. The ejection fraction was 35%. Patient had an ICD placed February 2012. I last saw him in March of 2012. Since then, the patient denies any dyspnea on exertion, orthopnea, PND, pedal edema, palpitations, syncope or chest pain.  Current Outpatient Prescriptions  Medication Sig Dispense Refill  . amitriptyline (ELAVIL) 25 MG tablet Take 25 mg by mouth as needed.        [name] aspirin 325 MG tablet Take 325 mg by mouth daily.        . benazepril (LOTENSIN) 10 MG tablet Take 1 tablet (10 mg total) by mouth daily.  30 tablet  6  . clopidogrel (PLAVIX) 75 MG tablet Take 75 mg by mouth daily.        [name] gabapentin (NEURONTIN) 300 MG capsule Take 300 mg by mouth as needed.        . metoprolol (TOPROL-XL) 50 MG 24 hr tablet 1/2 tab po qd       . nitroGLYCERIN (NITROSTAT) 0.4 MG SL tablet Place 0.4 mg under the tongue every 5 (five) minutes as needed. May repeat 3 times.       . NON FORMULARY IRIS STUDY - Use as directed       . simvastatin  (ZOCOR) 40 MG tablet Take 1 tablet (40 mg total) by mouth at bedtime.  30 tablet  6     Past Medical History  Diagnosis Date  . CAD (coronary artery disease)     status post diaphragmatic wall infarction in 1995, treated with percutaneous transluminal coronary  angioplasty with subsequent stenting of the right coronary artery in 2002.   [name] Hyperlipidemia   . Hypertension     Intolerant to MULTIPLE CHOLESTEROL MEDICATIONS.  [name] GERD (gastroesophageal reflux disease)   . Status post cholecystectomy   . PFO (patent foramen ovale)     Past Surgical History  Procedure Date  . Ptca     stent of the RCA 07/24/00  . Hernia repair   . Appendectomy   . Cardiac defibrillator placement     St. Jude  . Ankle surgery   . Cholecystectomy 12/02/02    History   Social History  . Marital Status: Married    Spouse Name: N/A    Number of Children: N/A  . Years of Education: N/A   Occupational History  . Not on file.   Social History Main Topics  . Smoking status: Smoker, Current Status Unknown  . Smokeless tobacco: Not on file  . Alcohol Use: Not  on file  . Drug Use: Not on file  . Sexually Active: Not on file   Other Topics Concern  . Not on file   Social History Narrative  . No narrative on file    ROS: no fevers or chills, productive cough, hemoptysis, dysphasia, odynophagia, melena, hematochezia, dysuria, hematuria, rash, seizure activity, orthopnea, PND, pedal edema, claudication. Remaining systems are negative.  Physical Exam: Well-developed well-nourished in no acute distress.  Skin is warm and dry.  HEENT is normal.  Neck is supple. No thyromegaly.  Chest is clear to auscultation with normal expansion. ICD in place Cardiovascular exam is regular rate and rhythm.  Abdominal exam nontender or distended. No masses palpated. Extremities show no edema. neuro grossly intact  ECG NSR with pacs, inferior MI

## 2010-10-16 NOTE — Assessment & Plan Note (Signed)
Patient counseled on discontinuing. 

## 2010-10-16 NOTE — Assessment & Plan Note (Signed)
Continue Zocor. Last lipids LDL not at goal. I recommended Crestor but patient cannot afford.

## 2010-10-16 NOTE — Assessment & Plan Note (Signed)
Blood pressure controlled. Continue present medications. 

## 2010-10-16 NOTE — Assessment & Plan Note (Signed)
Continue aspirin, beta blocker, ACE inhibitor and statin. 

## 2010-10-16 NOTE — Assessment & Plan Note (Signed)
Management per electrophysiology. 

## 2010-10-16 NOTE — Assessment & Plan Note (Signed)
Continue present medications. Will not advance as he has had problems with hypotension in the past.

## 2010-10-16 NOTE — Assessment & Plan Note (Signed)
Continue aspirin and Plavix for previous PFO and atrial septal aneurysm.

## 2010-10-16 NOTE — Patient Instructions (Signed)
Your physician wants you to follow-up in: one year You will receive a reminder letter in the mail two months in advance. If you don't receive a letter, please call our office to schedule the follow-up appointment.  

## 2010-11-11 LAB — PROTIME-INR: INR: 1

## 2010-11-11 LAB — COMPREHENSIVE METABOLIC PANEL
ALT: 21
AST: 21
Alkaline Phosphatase: 65
CO2: 31
Calcium: 9.2
Chloride: 103
GFR calc Af Amer: >60
GFR calc non Af Amer: >60
Potassium: 4.3
Sodium: 140

## 2010-11-11 LAB — CBC
MCHC: 33.8
RBC: 5.51
WBC: 10

## 2010-11-15 LAB — DIFFERENTIAL
Lymphocytes Relative: 23 % (ref 12–46)
Lymphs Abs: 2.6 10*3/uL (ref 0.7–4.0)
Monocytes Absolute: 1 10*3/uL (ref 0.1–1.0)
Monocytes Relative: 9 % (ref 3–12)
Neutro Abs: 6.8 10*3/uL (ref 1.7–7.7)
Neutrophils Relative %: 62 % (ref 43–77)

## 2010-11-15 LAB — APTT: aPTT: 34 seconds (ref 24–37)

## 2010-11-15 LAB — PROTIME-INR: INR: 1 (ref 0.00–1.49)

## 2010-11-15 LAB — RAPID URINE DRUG SCREEN, HOSP PERFORMED
Amphetamines: NOT DETECTED
Barbiturates: NOT DETECTED
Benzodiazepines: NOT DETECTED

## 2010-11-15 LAB — COMPREHENSIVE METABOLIC PANEL
Albumin: 3.3 g/dL — ABNORMAL LOW (ref 3.5–5.2)
BUN: 11 mg/dL (ref 6–23)
Calcium: 9 mg/dL (ref 8.4–10.5)
Creatinine, Ser: 1.08 mg/dL (ref 0.4–1.5)
Glucose, Bld: 123 mg/dL — ABNORMAL HIGH (ref 70–99)
Potassium: 3.4 mEq/L — ABNORMAL LOW (ref 3.5–5.1)
Total Protein: 6.4 g/dL (ref 6.0–8.3)

## 2010-11-15 LAB — LIPID PANEL
Triglycerides: 171 mg/dL — ABNORMAL HIGH (ref <150)
VLDL: 34 mg/dL (ref 0–40)

## 2010-11-15 LAB — CBC
Hemoglobin: 14.8 g/dL (ref 13.0–17.0)
MCHC: 34.1 g/dL (ref 30.0–36.0)
Platelets: 300 10*3/uL (ref 150–400)
RDW: 13.1 % (ref 11.5–15.5)

## 2010-11-15 LAB — URINALYSIS, ROUTINE W REFLEX MICROSCOPIC
Glucose, UA: NEGATIVE mg/dL
Ketones, ur: NEGATIVE mg/dL
Protein, ur: NEGATIVE mg/dL
Urobilinogen, UA: 1 mg/dL (ref 0.0–1.0)

## 2010-11-15 LAB — CK TOTAL AND CKMB (NOT AT ARMC)
CK, MB: 1.7 ng/mL (ref 0.3–4.0)
Relative Index: INVALID (ref 0.0–2.5)
Total CK: 75 U/L (ref 7–232)

## 2010-11-15 LAB — HEMOGLOBIN A1C
Hgb A1c MFr Bld: 5.8 % (ref 4.6–6.1)
Mean Plasma Glucose: 120 mg/dL

## 2010-11-15 LAB — HOMOCYSTEINE: Homocysteine: 10 umol/L (ref 4.0–15.4)

## 2010-11-15 LAB — TROPONIN I: Troponin I: 0.01 ng/mL (ref 0.00–0.06)

## 2010-12-12 ENCOUNTER — Encounter: Payer: Medicare Other | Admitting: *Deleted

## 2010-12-17 ENCOUNTER — Encounter: Payer: Self-pay | Admitting: *Deleted

## 2010-12-23 ENCOUNTER — Ambulatory Visit (INDEPENDENT_AMBULATORY_CARE_PROVIDER_SITE_OTHER): Payer: Medicare Other | Admitting: *Deleted

## 2010-12-23 ENCOUNTER — Encounter: Payer: Self-pay | Admitting: Internal Medicine

## 2010-12-23 ENCOUNTER — Other Ambulatory Visit: Payer: Self-pay | Admitting: Internal Medicine

## 2010-12-23 DIAGNOSIS — I2589 Other forms of chronic ischemic heart disease: Secondary | ICD-10-CM

## 2010-12-23 DIAGNOSIS — Z95810 Presence of automatic (implantable) cardiac defibrillator: Secondary | ICD-10-CM

## 2010-12-23 LAB — REMOTE ICD DEVICE
BRDY-0002RV: 40 {beats}/min
DEV-0020ICD: NEGATIVE
TZAT-0001FASTVT: 1
TZAT-0004FASTVT: 8
TZAT-0004SLOWVT: 8
TZAT-0012FASTVT: 200 ms
TZAT-0013FASTVT: 3
TZAT-0013SLOWVT: 3
TZAT-0020FASTVT: 1 ms
TZST-0001FASTVT: 3
TZST-0001FASTVT: 5
TZST-0001SLOWVT: 2
TZST-0003FASTVT: 30 J
TZST-0003FASTVT: 40 J
TZST-0003SLOWVT: 40 J
TZST-0003SLOWVT: 40 J

## 2010-12-27 NOTE — Progress Notes (Signed)
Remote icd check  

## 2011-01-01 ENCOUNTER — Other Ambulatory Visit: Payer: Self-pay | Admitting: Internal Medicine

## 2011-01-01 ENCOUNTER — Ambulatory Visit (INDEPENDENT_AMBULATORY_CARE_PROVIDER_SITE_OTHER): Payer: Medicare Other | Admitting: *Deleted

## 2011-01-01 ENCOUNTER — Encounter: Payer: Self-pay | Admitting: Internal Medicine

## 2011-01-01 DIAGNOSIS — I428 Other cardiomyopathies: Secondary | ICD-10-CM

## 2011-01-01 DIAGNOSIS — Z95810 Presence of automatic (implantable) cardiac defibrillator: Secondary | ICD-10-CM

## 2011-01-01 LAB — ICD DEVICE OBSERVATION
BATTERY VOLTAGE: 3.0982 V
BRDY-0002RV: 40 {beats}/min
FVT: 0
HV IMPEDENCE: 48 Ohm
PACEART VT: 0
TZAT-0001FASTVT: 1
TZAT-0001SLOWVT: 1
TZAT-0004FASTVT: 8
TZAT-0012FASTVT: 200 ms
TZAT-0013SLOWVT: 3
TZAT-0020FASTVT: 1 ms
TZON-0005FASTVT: 6
TZON-0010SLOWVT: 40 ms
TZST-0001FASTVT: 5
TZST-0001SLOWVT: 2
TZST-0003FASTVT: 30 J
TZST-0003SLOWVT: 30 J
TZST-0003SLOWVT: 40 J
VF: 0

## 2011-01-01 NOTE — Progress Notes (Signed)
Remote icd check  

## 2011-01-03 ENCOUNTER — Encounter: Payer: Self-pay | Admitting: *Deleted

## 2011-01-16 ENCOUNTER — Encounter: Payer: Self-pay | Admitting: *Deleted

## 2011-02-24 ENCOUNTER — Other Ambulatory Visit: Payer: Self-pay | Admitting: Cardiology

## 2011-02-24 MED ORDER — CLOPIDOGREL BISULFATE 75 MG PO TABS: 75.0000 mg | ORAL_TABLET | Freq: Every day | ORAL | Status: DC

## 2011-02-24 NOTE — Telephone Encounter (Signed)
New msg He has no pills left he needs rx for generic plavix-clopidogrel Please let him know

## 2011-02-24 NOTE — Telephone Encounter (Signed)
Called patient's wife and advised refill for Plavix sent to [facility].

## 2011-04-23 ENCOUNTER — Other Ambulatory Visit: Payer: Self-pay | Admitting: *Deleted

## 2011-04-23 MED ORDER — BENAZEPRIL HCL 10 MG PO TABS: 10.0000 mg | ORAL_TABLET | Freq: Every day | ORAL | Status: DC

## 2011-05-15 ENCOUNTER — Telehealth: Payer: Self-pay | Admitting: Internal Medicine

## 2011-05-15 NOTE — Telephone Encounter (Signed)
05-15-11 re sent February recall/mt

## 2011-06-12 ENCOUNTER — Encounter: Payer: Self-pay | Admitting: Internal Medicine

## 2011-06-12 ENCOUNTER — Ambulatory Visit (INDEPENDENT_AMBULATORY_CARE_PROVIDER_SITE_OTHER): Payer: Medicare Other | Admitting: Internal Medicine

## 2011-06-12 VITALS — BP 112/80 | HR 80 | Ht 73.0 in | Wt 209.8 lb

## 2011-06-12 DIAGNOSIS — I5022 Chronic systolic (congestive) heart failure: Secondary | ICD-10-CM

## 2011-06-12 DIAGNOSIS — Z95810 Presence of automatic (implantable) cardiac defibrillator: Secondary | ICD-10-CM

## 2011-06-12 DIAGNOSIS — I428 Other cardiomyopathies: Secondary | ICD-10-CM | POA: Insufficient documentation

## 2011-06-12 DIAGNOSIS — I2589 Other forms of chronic ischemic heart disease: Secondary | ICD-10-CM

## 2011-06-12 LAB — ICD DEVICE OBSERVATION
BRDY-0002RV: 40 {beats}/min
HV IMPEDENCE: 48 Ohm
PACEART VT: 0
RV LEAD IMPEDENCE ICD: 450 Ohm
RV LEAD THRESHOLD: 0.75 V
TOT-0010: 7
TZAT-0001FASTVT: 1
TZAT-0001SLOWVT: 1
TZAT-0004SLOWVT: 8
TZAT-0012FASTVT: 200 ms
TZAT-0018SLOWVT: NEGATIVE
TZAT-0019FASTVT: 7.5 V
TZAT-0019SLOWVT: 7.5 V
TZAT-0020FASTVT: 1 ms
TZAT-0020SLOWVT: 1 ms
TZON-0003SLOWVT: 330 ms
TZON-0004SLOWVT: 24
TZON-0005FASTVT: 6
TZON-0005SLOWVT: 6
TZON-0010FASTVT: 40 ms
TZON-0010SLOWVT: 40 ms
TZST-0001FASTVT: 4
TZST-0001FASTVT: 5
TZST-0001SLOWVT: 2
TZST-0001SLOWVT: 3
TZST-0001SLOWVT: 4
TZST-0003FASTVT: 30 J
TZST-0003FASTVT: 40 J
TZST-0003SLOWVT: 30 J
VENTRICULAR PACING ICD: 0 pct
VF: 0

## 2011-06-12 NOTE — Assessment & Plan Note (Signed)
His heart failure symptoms are class II. He will maintain a low-sodium diet. I've asked the patient continue his current medical therapy.

## 2011-06-12 NOTE — Assessment & Plan Note (Signed)
He denies anginal symptoms. I've asked him to continue his current medical therapy and increase his physical activity.

## 2011-06-12 NOTE — Progress Notes (Signed)
HPI [name] returns today for ICD followup. He is a pleasant 56 yo man with an ICM, s/p MI, chronic class 2 systolic CHF, he is status post ICD implantation. In the interim, he has done well. He denies chest pain, shortness of breath, or peripheral edema. No ICD shocks. He remains active but is not walking regularly. No Known Allergies   Current Outpatient Prescriptions  Medication Sig Dispense Refill  . amitriptyline (ELAVIL) 25 MG tablet Take 25 mg by mouth as needed.        [name] aspirin 325 MG tablet Take 325 mg by mouth daily.        . benazepril (LOTENSIN) 10 MG tablet Take 1 tablet (10 mg total) by mouth daily.  30 tablet  6  . clopidogrel (PLAVIX) 75 MG tablet Take 1 tablet (75 mg total) by mouth daily.  30 tablet  11  . gabapentin (NEURONTIN) 300 MG capsule Take 300 mg by mouth daily.       . metoprolol (TOPROL-XL) 50 MG 24 hr tablet 1/2 tab po qd       . nitroGLYCERIN (NITROSTAT) 0.4 MG SL tablet Place 0.4 mg under the tongue every 5 (five) minutes as needed. May repeat 3 times.       . NON FORMULARY IRIS STUDY - Use as directed       . simvastatin (ZOCOR) 40 MG tablet Take 1 tablet (40 mg total) by mouth at bedtime.  30 tablet  6     Past Medical History  Diagnosis Date  . CAD (coronary artery disease)     status post diaphragmatic wall infarction in 1995, treated with percutaneous transluminal coronary  angioplasty with subsequent stenting of the right coronary artery in 2002.   [name] Hyperlipidemia   . Hypertension     Intolerant to MULTIPLE CHOLESTEROL MEDICATIONS.  [name] GERD (gastroesophageal reflux disease)   . Status post cholecystectomy   . PFO (patent foramen ovale)     ROS:   All systems reviewed and negative except as noted in the HPI.   Past Surgical History  Procedure Date  . Ptca     stent of the RCA 07/24/00  . Hernia repair   . Appendectomy   . Cardiac defibrillator placement     St. Jude  . Ankle surgery   . Cholecystectomy 12/02/02     Family  History  Problem Relation Age of Onset  . Coronary artery disease       History   Social History  . Marital Status: Married    Spouse Name: N/A    Number of Children: N/A  . Years of Education: N/A   Occupational History  . Not on file.   Social History Main Topics  . Smoking status: Smoker, Current Status Unknown  . Smokeless tobacco: Not on file  . Alcohol Use: Not on file  . Drug Use: Not on file  . Sexually Active: Not on file   Other Topics Concern  . Not on file   Social History Narrative  . No narrative on file     BP 112/80  Pulse 80  Ht 6\' 1"  (1.854 m)  Wt 209 lb 12.8 oz (95.165 kg)  BMI 27.68 kg/m2  Physical Exam:  Well appearing middle-aged man, NAD HEENT: Unremarkable Neck:  No JVD, no thyromegally Lungs:  Clear with no wheezes, rales, or rhonchi. Well-healed ICD incision. HEART:  Regular rate rhythm, no murmurs, no rubs, no clicks Abd:  soft, positive bowel sounds, no  organomegally, no rebound, no guarding Ext:  2 plus pulses, no edema, no cyanosis, no clubbing Skin:  No rashes no nodules Neuro:  CN II through XII intact, motor grossly intact  DEVICE  Normal device function.  See PaceArt for details.   Assess/Plan:

## 2011-06-12 NOTE — Patient Instructions (Signed)
Your physician wants you to follow-up in:12 months with Dr Taylor You will receive a reminder letter in the mail two months in advance. If you don't receive a letter, please call our office to schedule the follow-up appointment.   Remote monitoring is used to monitor your Pacemaker of ICD from home. This monitoring reduces the number of office visits required to check your device to one time per year. It allows us to keep an eye on the functioning of your device to ensure it is working properly. You are scheduled for a device check from home on 09/18/11. You may send your transmission at any time that day. If you have a wireless device, the transmission will be sent automatically. After your physician reviews your transmission, you will receive a postcard with your next transmission date.   

## 2011-06-12 NOTE — Assessment & Plan Note (Signed)
His device is working normally. We'll plan to recheck in several months. 

## 2011-08-06 ENCOUNTER — Other Ambulatory Visit: Payer: Self-pay | Admitting: Cardiology

## 2011-08-06 MED ORDER — SIMVASTATIN 40 MG PO TABS: 40.0000 mg | ORAL_TABLET | Freq: Every day | ORAL | Status: DC

## 2011-09-03 ENCOUNTER — Other Ambulatory Visit: Payer: Self-pay | Admitting: *Deleted

## 2011-09-03 MED ORDER — METOPROLOL SUCCINATE ER 50 MG PO TB24: ORAL_TABLET | ORAL | Status: DC

## 2011-09-18 ENCOUNTER — Encounter: Payer: Self-pay | Admitting: *Deleted

## 2011-09-18 ENCOUNTER — Encounter: Payer: Self-pay | Admitting: Internal Medicine

## 2011-09-18 ENCOUNTER — Ambulatory Visit (INDEPENDENT_AMBULATORY_CARE_PROVIDER_SITE_OTHER): Payer: Medicare Other | Admitting: *Deleted

## 2011-09-18 DIAGNOSIS — I5022 Chronic systolic (congestive) heart failure: Secondary | ICD-10-CM

## 2011-09-18 DIAGNOSIS — I428 Other cardiomyopathies: Secondary | ICD-10-CM

## 2011-09-18 LAB — REMOTE ICD DEVICE
DEV-0020ICD: NEGATIVE
HV IMPEDENCE: 48 Ohm
RV LEAD AMPLITUDE: 9.7 mv
RV LEAD IMPEDENCE ICD: 410 Ohm
TZAT-0004FASTVT: 8
TZAT-0004SLOWVT: 8
TZAT-0012FASTVT: 200 ms
TZAT-0012SLOWVT: 200 ms
TZAT-0013SLOWVT: 3
TZAT-0018FASTVT: NEGATIVE
TZAT-0018SLOWVT: NEGATIVE
TZAT-0019FASTVT: 7.5 V
TZAT-0020FASTVT: 1 ms
TZON-0003FASTVT: 300 ms
TZON-0003SLOWVT: 330 ms
TZON-0004FASTVT: 24
TZON-0005FASTVT: 6
TZON-0010SLOWVT: 40 ms
TZST-0001FASTVT: 3
TZST-0001FASTVT: 4
TZST-0001SLOWVT: 3
TZST-0003FASTVT: 36 J
TZST-0003FASTVT: 40 J

## 2011-09-25 ENCOUNTER — Encounter: Payer: Self-pay | Admitting: *Deleted

## 2011-12-22 ENCOUNTER — Ambulatory Visit (INDEPENDENT_AMBULATORY_CARE_PROVIDER_SITE_OTHER): Payer: Medicare Other | Admitting: *Deleted

## 2011-12-22 ENCOUNTER — Encounter: Payer: Self-pay | Admitting: Internal Medicine

## 2011-12-22 DIAGNOSIS — I5022 Chronic systolic (congestive) heart failure: Secondary | ICD-10-CM

## 2011-12-22 DIAGNOSIS — I428 Other cardiomyopathies: Secondary | ICD-10-CM

## 2011-12-23 LAB — REMOTE ICD DEVICE
DEVICE MODEL ICD: 783786
RV LEAD AMPLITUDE: 9.6 mv
TZAT-0001FASTVT: 1
TZAT-0004FASTVT: 8
TZAT-0018SLOWVT: NEGATIVE
TZAT-0020SLOWVT: 1 ms
TZON-0003SLOWVT: 330 ms
TZON-0010FASTVT: 40 ms
TZON-0010SLOWVT: 40 ms
TZST-0001FASTVT: 3
TZST-0001SLOWVT: 2
TZST-0001SLOWVT: 4
TZST-0003FASTVT: 30 J
TZST-0003FASTVT: 40 J

## 2011-12-30 ENCOUNTER — Encounter: Payer: Self-pay | Admitting: *Deleted

## 2012-02-17 ENCOUNTER — Encounter: Payer: Self-pay | Admitting: *Deleted

## 2012-02-17 ENCOUNTER — Encounter: Payer: Self-pay | Admitting: Cardiology

## 2012-02-17 ENCOUNTER — Ambulatory Visit (INDEPENDENT_AMBULATORY_CARE_PROVIDER_SITE_OTHER): Payer: Medicare Other | Admitting: Cardiology

## 2012-02-17 VITALS — BP 130/80 | HR 76 | Ht 72.0 in | Wt 205.0 lb

## 2012-02-17 DIAGNOSIS — I2589 Other forms of chronic ischemic heart disease: Secondary | ICD-10-CM

## 2012-02-17 DIAGNOSIS — I2510 Atherosclerotic heart disease of native coronary artery without angina pectoris: Secondary | ICD-10-CM

## 2012-02-17 DIAGNOSIS — Z95810 Presence of automatic (implantable) cardiac defibrillator: Secondary | ICD-10-CM

## 2012-02-17 DIAGNOSIS — F17200 Nicotine dependence, unspecified, uncomplicated: Secondary | ICD-10-CM

## 2012-02-17 DIAGNOSIS — I6350 Cerebral infarction due to unspecified occlusion or stenosis of unspecified cerebral artery: Secondary | ICD-10-CM

## 2012-02-17 DIAGNOSIS — I10 Essential (primary) hypertension: Secondary | ICD-10-CM

## 2012-02-17 LAB — BASIC METABOLIC PANEL
BUN: 11 mg/dL (ref 6–23)
CO2: 26 mEq/L (ref 19–32)
Calcium: 8.9 mg/dL (ref 8.4–10.5)
Chloride: 102 mEq/L (ref 96–112)
Creatinine, Ser: 1 mg/dL (ref 0.4–1.5)
Glucose, Bld: 111 mg/dL — ABNORMAL HIGH (ref 70–99)

## 2012-02-17 LAB — HEPATIC FUNCTION PANEL
AST: 21 U/L (ref 0–37)
Albumin: 3.7 g/dL (ref 3.5–5.2)
Alkaline Phosphatase: 59 U/L (ref 39–117)
Bilirubin, Direct: 0.1 mg/dL (ref 0.0–0.3)
Total Bilirubin: 0.8 mg/dL (ref 0.3–1.2)

## 2012-02-17 LAB — LIPID PANEL
Total CHOL/HDL Ratio: 4
Triglycerides: 140 mg/dL (ref 0.0–149.0)

## 2012-02-17 NOTE — Assessment & Plan Note (Signed)
Patient with history of atrial septal aneurysm and PFO. Continue aspirin and Plavix.  

## 2012-02-17 NOTE — Progress Notes (Signed)
HPI: [name] is pleasant gentleman for f/U of CM, CAD and prior CVA. 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 In 2002 he had a bare-metal stent to the right coronary artery. He was admitted with a stroke with symptoms of confusion and ataxia in Sept of 2011. He had a TEE which showed an ejection fraction of 30-35% with no evident thrombus. 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 His MRI showed multiple posterior circulation infarct. Because his LV function was worse he underwent cardiac catheterization in January of 2012. This revealed 25 LM. There is a 50-60% stenosis of the mid-LAD. There is a diagonal branch arising from this area with ostial 60-70% stenosis. There is an ostial 50% stenosis of the circumflex/intermediate noted. There is a patent stent in the RCA midvessel with 30-40% in-stent restenosis. The proximal and distal vessel both have 50% stenosis noted. The ejection fraction was 35%. Patient had an ICD placed February 2012. I last saw him in Sept of 2012. Since then, the patient has dyspnea with more extreme activities but not with routine activities. It is relieved with rest. It is not associated with chest pain. There is no orthopnea, PND or pedal edema. There is no syncope or palpitations. There is no exertional chest pain.    Current Outpatient Prescriptions  Medication Sig Dispense Refill  . amitriptyline (ELAVIL) 25 MG tablet Take 25 mg by mouth as needed.        [name] aspirin 325 MG tablet Take 325 mg by mouth daily.        . benazepril (LOTENSIN) 10 MG tablet Take 1 tablet (10 mg total) by mouth daily.  30 tablet  6  . clopidogrel (PLAVIX) 75 MG tablet Take 1 tablet (75 mg total) by mouth daily.  30 tablet  11  . metoprolol succinate (TOPROL-XL) 50 MG 24 hr tablet 1/2 tab po qd as directed  30 tablet  11  . nitroGLYCERIN (NITROSTAT) 0.4 MG SL tablet Place 0.4 mg under the tongue every 5 (five) minutes  as needed. May repeat 3 times.       . NON FORMULARY IRIS STUDY - Use as directed       . simvastatin (ZOCOR) 40 MG tablet Take 1 tablet (40 mg total) by mouth at bedtime.  30 tablet  6     Past Medical History  Diagnosis Date  . CAD (coronary artery disease)     status post diaphragmatic wall infarction in 1995, treated with percutaneous transluminal coronary  angioplasty with subsequent stenting of the right coronary artery in 2002.   [name] Hyperlipidemia   . Hypertension     Intolerant to MULTIPLE CHOLESTEROL MEDICATIONS.  [name] GERD (gastroesophageal reflux disease)   . Status post cholecystectomy   . PFO (patent foramen ovale)     Past Surgical History  Procedure Date  . Ptca     stent of the RCA 07/24/00  . Hernia repair   . Appendectomy   . Cardiac defibrillator placement     St. Jude  . Ankle surgery   . Cholecystectomy 12/02/02    History   Social History  . Marital Status: Married    Spouse Name: N/A    Number of Children: N/A  . Years of Education: N/A   Occupational History  . Not on file.   Social History Main Topics  . Smoking status: Smoker, Current Status  Unknown  . Smokeless tobacco: Not on file  . Alcohol Use: Not on file  . Drug Use: Not on file  . Sexually Active: Not on file   Other Topics Concern  . Not on file   Social History Narrative  . No narrative on file    ROS: no fevers or chills, productive cough, hemoptysis, dysphasia, odynophagia, melena, hematochezia, dysuria, hematuria, rash, seizure activity, orthopnea, PND, pedal edema, claudication. Remaining systems are negative.  Physical Exam: Well-developed well-nourished in no acute distress.  Skin is warm and dry.  HEENT is normal.  Neck is supple.  Chest is clear to auscultation with normal expansion.  Cardiovascular exam is regular rate and rhythm.  Abdominal exam nontender or distended. No masses palpated. Extremities show no edema. neuro grossly intact  ECG sinus rhythm at a  rate of 76. Prior inferior infarct.

## 2012-02-17 NOTE — Assessment & Plan Note (Signed)
Continue aspirin and statin. 

## 2012-02-17 NOTE — Assessment & Plan Note (Signed)
Management per electrophysiology. 

## 2012-02-17 NOTE — Patient Instructions (Addendum)
Your physician wants you to follow-up in: ONE YEAR WITH DR CRENSHAW You will receive a reminder letter in the mail two months in advance. If you don't receive a letter, please call our office to schedule the follow-up appointment.   Your physician recommends that you HAVE LAB WORK TODAY 

## 2012-02-17 NOTE — Assessment & Plan Note (Signed)
Blood pressure controlled. Continue present medications. Check potassium and renal function. 

## 2012-02-17 NOTE — Assessment & Plan Note (Signed)
Continue statin. Check lipids and liver. 

## 2012-02-17 NOTE — Assessment & Plan Note (Signed)
Patient counseled on discontinuing. 

## 2012-02-17 NOTE — Assessment & Plan Note (Signed)
Continue ACE inhibitor and beta blocker. 

## 2012-03-03 ENCOUNTER — Other Ambulatory Visit: Payer: Self-pay | Admitting: *Deleted

## 2012-03-03 MED ORDER — NITROGLYCERIN 0.4 MG SL SUBL: 0.4000 mg | SUBLINGUAL_TABLET | SUBLINGUAL | Status: DC | PRN

## 2012-03-03 MED ORDER — CLOPIDOGREL BISULFATE 75 MG PO TABS: 75.0000 mg | ORAL_TABLET | Freq: Every day | ORAL | Status: DC

## 2012-03-03 MED ORDER — METOPROLOL SUCCINATE ER 50 MG PO TB24: ORAL_TABLET | ORAL | Status: DC

## 2012-03-03 MED ORDER — SIMVASTATIN 40 MG PO TABS: 40.0000 mg | ORAL_TABLET | Freq: Every day | ORAL | Status: DC

## 2012-03-03 MED ORDER — BENAZEPRIL HCL 10 MG PO TABS: 10.0000 mg | ORAL_TABLET | Freq: Every day | ORAL | Status: DC

## 2012-03-29 ENCOUNTER — Other Ambulatory Visit: Payer: Self-pay | Admitting: Internal Medicine

## 2012-03-29 ENCOUNTER — Encounter: Payer: Self-pay | Admitting: Internal Medicine

## 2012-03-29 ENCOUNTER — Ambulatory Visit (INDEPENDENT_AMBULATORY_CARE_PROVIDER_SITE_OTHER): Payer: Medicare Other | Admitting: *Deleted

## 2012-03-29 DIAGNOSIS — I428 Other cardiomyopathies: Secondary | ICD-10-CM

## 2012-03-29 DIAGNOSIS — I5022 Chronic systolic (congestive) heart failure: Secondary | ICD-10-CM

## 2012-03-29 DIAGNOSIS — Z95810 Presence of automatic (implantable) cardiac defibrillator: Secondary | ICD-10-CM

## 2012-03-31 LAB — REMOTE ICD DEVICE
BRDY-0002RV: 40 {beats}/min
DEVICE MODEL ICD: 783786
HV IMPEDENCE: 46 Ohm
TZAT-0004FASTVT: 8
TZAT-0004SLOWVT: 8
TZAT-0012FASTVT: 200 ms
TZAT-0013FASTVT: 3
TZAT-0013SLOWVT: 3
TZAT-0018FASTVT: NEGATIVE
TZAT-0018SLOWVT: NEGATIVE
TZAT-0019FASTVT: 7.5 V
TZON-0003FASTVT: 300 ms
TZON-0003SLOWVT: 330 ms
TZON-0004FASTVT: 24
TZON-0010SLOWVT: 40 ms
TZST-0001FASTVT: 3
TZST-0001FASTVT: 4
TZST-0001SLOWVT: 3
TZST-0003FASTVT: 40 J
VENTRICULAR PACING ICD: 1 pct

## 2012-04-14 ENCOUNTER — Encounter: Payer: Self-pay | Admitting: *Deleted

## 2012-05-04 ENCOUNTER — Ambulatory Visit (INDEPENDENT_AMBULATORY_CARE_PROVIDER_SITE_OTHER): Payer: Self-pay | Admitting: Neurology

## 2012-05-04 DIAGNOSIS — I639 Cerebral infarction, unspecified: Secondary | ICD-10-CM

## 2012-05-04 DIAGNOSIS — Z0289 Encounter for other administrative examinations: Secondary | ICD-10-CM

## 2012-05-05 NOTE — Progress Notes (Signed)
Participant was in on 05/04/2012, for his 4th annual study visit.  MMSE was successfully completed. Blood was drawn and shipped.

## 2012-07-07 ENCOUNTER — Other Ambulatory Visit: Payer: Self-pay | Admitting: *Deleted

## 2012-07-07 MED ORDER — SIMVASTATIN 40 MG PO TABS: 40.0000 mg | ORAL_TABLET | Freq: Every day | ORAL | Status: DC

## 2012-09-07 ENCOUNTER — Encounter: Payer: Self-pay | Admitting: *Deleted

## 2012-11-09 ENCOUNTER — Ambulatory Visit (INDEPENDENT_AMBULATORY_CARE_PROVIDER_SITE_OTHER): Payer: Medicare Other | Admitting: Internal Medicine

## 2012-11-09 ENCOUNTER — Encounter: Payer: Self-pay | Admitting: Internal Medicine

## 2012-11-09 VITALS — BP 127/84 | HR 69 | Ht 74.0 in | Wt 201.0 lb

## 2012-11-09 DIAGNOSIS — Z95810 Presence of automatic (implantable) cardiac defibrillator: Secondary | ICD-10-CM

## 2012-11-09 DIAGNOSIS — I428 Other cardiomyopathies: Secondary | ICD-10-CM

## 2012-11-09 DIAGNOSIS — I5022 Chronic systolic (congestive) heart failure: Secondary | ICD-10-CM

## 2012-11-09 LAB — ICD DEVICE OBSERVATION
DEVICE MODEL ICD: 783786
PACEART VT: 0
RV LEAD IMPEDENCE ICD: 412.5 Ohm
TOT-0007: 1
TOT-0008: 0
TOT-0009: 0
TZAT-0004FASTVT: 8
TZAT-0004SLOWVT: 8
TZAT-0012FASTVT: 200 ms
TZAT-0018FASTVT: NEGATIVE
TZAT-0018SLOWVT: NEGATIVE
TZAT-0019FASTVT: 7.5 V
TZON-0003FASTVT: 300 ms
TZON-0003SLOWVT: 330 ms
TZON-0004FASTVT: 30
TZON-0010SLOWVT: 40 ms
TZST-0001FASTVT: 3
TZST-0001FASTVT: 4
TZST-0001SLOWVT: 2
TZST-0001SLOWVT: 3
TZST-0003FASTVT: 40 J
VENTRICULAR PACING ICD: 0 pct

## 2012-11-09 NOTE — Assessment & Plan Note (Signed)
His heart failure symptoms remain class II. I've encouraged the patient to increase his physical activity, continue his medications, maintain a low-sodium diet.

## 2012-11-09 NOTE — Progress Notes (Signed)
HPI [name] returns today for ICD followup. He is a pleasant 57 yo man with an ICM, s/p MI, chronic class 2 systolic CHF, he is status post ICD implantation. In the interim, he has done well. He denies chest pain, shortness of breath, or peripheral edema. No ICD shocks. He remains active but is not walking regularly. He notes minimal tingling sensations over the device, but denies fevers, chills, or erythema. No Known Allergies   Current Outpatient Prescriptions  Medication Sig Dispense Refill  . amitriptyline (ELAVIL) 25 MG tablet Take 25 mg by mouth as needed.        [name] aspirin 325 MG tablet Take 325 mg by mouth daily.        . benazepril (LOTENSIN) 10 MG tablet Take 1 tablet (10 mg total) by mouth daily.  30 tablet  11  . clopidogrel (PLAVIX) 75 MG tablet Take 1 tablet (75 mg total) by mouth daily.  30 tablet  11  . metoprolol succinate (TOPROL-XL) 50 MG 24 hr tablet 1/2 tab po qd as directed  30 tablet  6  . nitroGLYCERIN (NITROSTAT) 0.4 MG SL tablet Place 1 tablet (0.4 mg total) under the tongue every 5 (five) minutes as needed. May repeat 3 times.  25 tablet  5  . NON FORMULARY IRIS STUDY - Use as directed       . simvastatin (ZOCOR) 40 MG tablet Take 1 tablet (40 mg total) by mouth at bedtime.  30 tablet  11   No current facility-administered medications for this visit.     Past Medical History  Diagnosis Date  . CAD (coronary artery disease)     status post diaphragmatic wall infarction in 1995, treated with percutaneous transluminal coronary  angioplasty with subsequent stenting of the right coronary artery in 2002.   [name] Hyperlipidemia   . Hypertension     Intolerant to MULTIPLE CHOLESTEROL MEDICATIONS.  [name] GERD (gastroesophageal reflux disease)   . Status post cholecystectomy   . PFO (patent foramen ovale)     ROS:   All systems reviewed and negative except as noted in the HPI.   Past Surgical History  Procedure Laterality Date  . Ptca      stent of the RCA 07/24/00   . Hernia repair    . Appendectomy    . Cardiac defibrillator placement      St. Jude  . Ankle surgery    . Cholecystectomy  12/02/02     Family History  Problem Relation Age of Onset  . Coronary artery disease       History   Social History  . Marital Status: Married    Spouse Name: N/A    Number of Children: N/A  . Years of Education: N/A   Occupational History  . Not on file.   Social History Main Topics  . Smoking status: Smoker, Current Status Unknown  . Smokeless tobacco: Not on file  . Alcohol Use: Not on file  . Drug Use: Not on file  . Sexual Activity: Not on file   Other Topics Concern  . Not on file   Social History Narrative  . No narrative on file     Blood pressure 127/84, pulse 69, height 6 foot 2 inches, weight 201 pounds, body mass index 25.8 Physical Exam:  Well appearing middle-aged man, NAD HEENT: Unremarkable Neck:  No JVD, no thyromegally Lungs:  Clear with no wheezes, rales, or rhonchi. Well-healed ICD incision. HEART:  Regular rate rhythm, no murmurs,  no rubs, no clicks Abd:  soft, positive bowel sounds, no organomegally, no rebound, no guarding Ext:  2 plus pulses, no edema, no cyanosis, no clubbing Skin:  No rashes no nodules Neuro:  CN II through XII intact, motor grossly intact  DEVICE  Normal device function.  See PaceArt for details.   Assess/Plan:

## 2012-11-09 NOTE — Assessment & Plan Note (Signed)
His St. Jude single-chamber ICD is working normally. We'll plan to recheck in several months. 

## 2012-11-09 NOTE — Patient Instructions (Addendum)
Your physician wants you to follow-up in: 12 months with Dr Court Joy will receive a reminder letter in the mail two months in advance. If you don't receive a letter, please call our office to schedule the follow-up appointment.   Remote monitoring is used to monitor your Pacemaker of ICD from home. This monitoring reduces the number of office visits required to check your device to one time per year. It allows [place] to keep an eye on the functioning of your device to ensure it is working properly. You are scheduled for a device check from home on 02/15/12. You may send your transmission at any time that day. If you have a wireless device, the transmission will be sent automatically. After your physician reviews your transmission, you will receive a postcard with your next transmission date.

## 2012-11-12 ENCOUNTER — Encounter: Payer: Self-pay | Admitting: Internal Medicine

## 2013-02-14 ENCOUNTER — Ambulatory Visit (INDEPENDENT_AMBULATORY_CARE_PROVIDER_SITE_OTHER): Payer: Medicare Other | Admitting: *Deleted

## 2013-02-14 DIAGNOSIS — I5022 Chronic systolic (congestive) heart failure: Secondary | ICD-10-CM

## 2013-02-14 DIAGNOSIS — I2589 Other forms of chronic ischemic heart disease: Secondary | ICD-10-CM

## 2013-02-14 DIAGNOSIS — I428 Other cardiomyopathies: Secondary | ICD-10-CM

## 2013-02-14 LAB — MDC_IDC_ENUM_SESS_TYPE_REMOTE
Battery Remaining Percentage: 72 %
Battery Voltage: 2.98 V
Brady Statistic RV Percent Paced: 1 %
Date Time Interrogation Session: 20150105082729
HighPow Impedance: 46 Ohm
Lead Channel Setting Pacing Amplitude: 2.5 V
Lead Channel Setting Pacing Pulse Width: 0.5 ms
MDC IDC MSMT BATTERY REMAINING LONGEVITY: 82 mo
MDC IDC MSMT LEADCHNL RV IMPEDANCE VALUE: 400 Ohm
MDC IDC MSMT LEADCHNL RV SENSING INTR AMPL: 11.3 mV
MDC IDC PG SERIAL: 783786
MDC IDC SET LEADCHNL RV SENSING SENSITIVITY: 0.5 mV
MDC IDC SET ZONE DETECTION INTERVAL: 300 ms
Zone Setting Detection Interval: 250 ms
Zone Setting Detection Interval: 330 ms

## 2013-03-04 ENCOUNTER — Other Ambulatory Visit: Payer: Self-pay | Admitting: *Deleted

## 2013-03-04 MED ORDER — CLOPIDOGREL BISULFATE 75 MG PO TABS: 75.0000 mg | ORAL_TABLET | Freq: Every day | ORAL | Status: DC

## 2013-03-04 MED ORDER — BENAZEPRIL HCL 10 MG PO TABS: 10.0000 mg | ORAL_TABLET | Freq: Every day | ORAL | Status: DC

## 2013-03-07 ENCOUNTER — Encounter: Payer: Self-pay | Admitting: Cardiology

## 2013-03-07 ENCOUNTER — Ambulatory Visit (INDEPENDENT_AMBULATORY_CARE_PROVIDER_SITE_OTHER): Payer: Medicare Other | Admitting: Cardiology

## 2013-03-07 VITALS — BP 135/87 | HR 90 | Ht 74.0 in | Wt 195.8 lb

## 2013-03-07 DIAGNOSIS — I2510 Atherosclerotic heart disease of native coronary artery without angina pectoris: Secondary | ICD-10-CM

## 2013-03-07 DIAGNOSIS — I2589 Other forms of chronic ischemic heart disease: Secondary | ICD-10-CM

## 2013-03-07 DIAGNOSIS — F17200 Nicotine dependence, unspecified, uncomplicated: Secondary | ICD-10-CM

## 2013-03-07 DIAGNOSIS — E785 Hyperlipidemia, unspecified: Secondary | ICD-10-CM

## 2013-03-07 DIAGNOSIS — I10 Essential (primary) hypertension: Secondary | ICD-10-CM

## 2013-03-07 DIAGNOSIS — I6350 Cerebral infarction due to unspecified occlusion or stenosis of unspecified cerebral artery: Secondary | ICD-10-CM

## 2013-03-07 MED ORDER — TADALAFIL 10 MG PO TABS: 10.0000 mg | ORAL_TABLET | Freq: Every day | ORAL | Status: DC | PRN

## 2013-03-07 NOTE — Assessment & Plan Note (Signed)
Continue statin. Lipids and liver monitored by primary care. 

## 2013-03-07 NOTE — Assessment & Plan Note (Signed)
Blood pressure controlled. Continue present medications. Check potassium and renal function. 

## 2013-03-07 NOTE — Assessment & Plan Note (Signed)
Patient with history of atrial septal aneurysm and PFO. Continue aspirin and Plavix.

## 2013-03-07 NOTE — Patient Instructions (Signed)
Your physician wants you to follow-up in: ONE YEAR WITH DR CRENSHAW You will receive a reminder letter in the mail two months in advance. If you don't receive a letter, please call our office to schedule the follow-up appointment.  

## 2013-03-07 NOTE — Assessment & Plan Note (Signed)
Continue aspirin and statin. 

## 2013-03-07 NOTE — Progress Notes (Signed)
HPI: FU CM, CAD and prior CVA. 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 In 2002 he had a bare-metal stent to the right coronary artery. He was admitted with a stroke with symptoms of confusion and ataxia in Sept of 2011. He had a TEE which showed an ejection fraction of 30-35% with no evident thrombus. 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 His MRI showed multiple posterior circulation infarct. Because his LV function was worse he underwent cardiac catheterization in January of 2012. This revealed 25 LM. There is a 50-60% stenosis of the mid-LAD. There is a diagonal branch arising from this area with ostial 60-70% stenosis. There is an ostial 50% stenosis of the circumflex/intermediate noted. There is a patent stent in the RCA midvessel with 30-40% in-stent restenosis. The proximal and distal vessel both have 50% stenosis noted. The ejection fraction was 35%. Patient had an ICD placed February 2012. I last saw him in Jan 2014. Since then, the patient has dyspnea with more extreme activities but not with routine activities. It is relieved with rest. It is not associated with chest pain. There is no orthopnea, PND or pedal edema. There is no syncope or palpitations. There is no exertional chest pain.    Current Outpatient Prescriptions  Medication Sig Dispense Refill  . amitriptyline (ELAVIL) 25 MG tablet Take 25 mg by mouth as needed.        [name] aspirin 325 MG tablet Take 325 mg by mouth daily.        . benazepril (LOTENSIN) 10 MG tablet Take 1 tablet (10 mg total) by mouth daily.  30 tablet  0  . clopidogrel (PLAVIX) 75 MG tablet Take 1 tablet (75 mg total) by mouth daily.  30 tablet  0  . metoprolol succinate (TOPROL-XL) 50 MG 24 hr tablet 1/2 tab po qd as directed  30 tablet  6  . nitroGLYCERIN (NITROSTAT) 0.4 MG SL tablet Place 1 tablet (0.4 mg total) under the tongue every 5 (five) minutes as needed. May repeat 3  times.  25 tablet  5  . simvastatin (ZOCOR) 40 MG tablet Take 1 tablet (40 mg total) by mouth at bedtime.  30 tablet  11   No current facility-administered medications for this visit.     Past Medical History  Diagnosis Date  . CAD (coronary artery disease)     status post diaphragmatic wall infarction in 1995, treated with percutaneous transluminal coronary  angioplasty with subsequent stenting of the right coronary artery in 2002.   [name] Hyperlipidemia   . Hypertension     Intolerant to MULTIPLE CHOLESTEROL MEDICATIONS.  [name] GERD (gastroesophageal reflux disease)   . Status post cholecystectomy   . PFO (patent foramen ovale)     Past Surgical History  Procedure Laterality Date  . Ptca      stent of the RCA 07/24/00  . Hernia repair    . Appendectomy    . Cardiac defibrillator placement      St. Jude  . Ankle surgery    . Cholecystectomy  12/02/02    History   Social History  . Marital Status: Married    Spouse Name: N/A    Number of Children: N/A  . Years of Education: N/A   Occupational History  . Not on file.   Social History Main Topics  . Smoking status: Smoker, Current Status Unknown  .  Smokeless tobacco: Not on file  . Alcohol Use: Not on file  . Drug Use: Not on file  . Sexual Activity: Not on file   Other Topics Concern  . Not on file   Social History Narrative  . No narrative on file    ROS: no fevers or chills, productive cough, hemoptysis, dysphasia, odynophagia, melena, hematochezia, dysuria, hematuria, rash, seizure activity, orthopnea, PND, pedal edema, claudication. Remaining systems are negative.  Physical Exam: Well-developed well-nourished in no acute distress.  Skin is warm and dry.  HEENT is normal.  Neck is supple.  Chest is clear to auscultation with normal expansion.  Cardiovascular exam is regular rate and rhythm.  Abdominal exam nontender or distended. No masses palpated. Extremities show no edema. neuro grossly intact  ECG  sinus rhythm at a rate of 86, prior inferior infarct.

## 2013-03-07 NOTE — Assessment & Plan Note (Signed)
Patient counseled on discontinuing. 

## 2013-03-07 NOTE — Assessment & Plan Note (Signed)
Management per electrophysiology. 

## 2013-03-07 NOTE — Assessment & Plan Note (Signed)
Continue beta blocker and ACE inhibitor. 

## 2013-04-05 ENCOUNTER — Encounter: Payer: Self-pay | Admitting: *Deleted

## 2013-04-08 ENCOUNTER — Encounter: Payer: Self-pay | Admitting: Internal Medicine

## 2013-04-19 ENCOUNTER — Encounter (INDEPENDENT_AMBULATORY_CARE_PROVIDER_SITE_OTHER): Payer: Self-pay

## 2013-04-19 ENCOUNTER — Ambulatory Visit (INDEPENDENT_AMBULATORY_CARE_PROVIDER_SITE_OTHER): Payer: Self-pay | Admitting: *Deleted

## 2013-04-19 DIAGNOSIS — I639 Cerebral infarction, unspecified: Secondary | ICD-10-CM

## 2013-04-19 DIAGNOSIS — I6350 Cerebral infarction due to unspecified occlusion or stenosis of unspecified cerebral artery: Secondary | ICD-10-CM

## 2013-04-19 NOTE — Progress Notes (Signed)
Participant in the office today for his EXIT visit for the IRIS study. MMSE was completed successfully without any problems. Blood was drawn and shipped.  Last bottle of study medications was mailed back to the [facility].  Participant  to continue with follow-up visits with his PCP.  Participant was told to have a FBS drawn by his PCP within the next 2 months. Participant will be notified of study trial results in late 2015.

## 2013-04-21 ENCOUNTER — Other Ambulatory Visit: Payer: Self-pay | Admitting: *Deleted

## 2013-04-21 MED ORDER — CLOPIDOGREL BISULFATE 75 MG PO TABS: 75.0000 mg | ORAL_TABLET | Freq: Every day | ORAL | Status: DC

## 2013-04-26 ENCOUNTER — Telehealth: Payer: Self-pay | Admitting: *Deleted

## 2013-04-26 MED ORDER — TADALAFIL 5 MG PO TABS: 5.0000 mg | ORAL_TABLET | Freq: Every day | ORAL | Status: DC | PRN

## 2013-04-26 NOTE — Telephone Encounter (Signed)
Maudie Mercury, can you send this in please? He wants it sent to [facility]. Thanks, MI

## 2013-04-26 NOTE — Telephone Encounter (Signed)
Patient requests to change his cialis to 5mg  as he cannot afford the 10mg  tablet and he has a free trial offer for the 5mg . Is this ok to fill? Please advise. Thanks, MI

## 2013-04-26 NOTE — Telephone Encounter (Signed)
done

## 2013-04-26 NOTE — Telephone Encounter (Signed)
Ok to change to 5 mg prn Kirk Ruths

## 2013-04-26 NOTE — Telephone Encounter (Signed)
[facility] notified

## 2013-05-18 ENCOUNTER — Encounter: Payer: Self-pay | Admitting: Internal Medicine

## 2013-05-18 ENCOUNTER — Ambulatory Visit (INDEPENDENT_AMBULATORY_CARE_PROVIDER_SITE_OTHER): Payer: Medicare Other | Admitting: *Deleted

## 2013-05-18 DIAGNOSIS — Z95810 Presence of automatic (implantable) cardiac defibrillator: Secondary | ICD-10-CM

## 2013-05-18 DIAGNOSIS — I428 Other cardiomyopathies: Secondary | ICD-10-CM

## 2013-05-18 DIAGNOSIS — I5022 Chronic systolic (congestive) heart failure: Secondary | ICD-10-CM

## 2013-05-18 LAB — MDC_IDC_ENUM_SESS_TYPE_REMOTE
Battery Remaining Longevity: 78 mo
Battery Voltage: 2.98 V
Brady Statistic RV Percent Paced: 1 %
HIGH POWER IMPEDANCE MEASURED VALUE: 47 Ohm
Implantable Pulse Generator Serial Number: 783786
Lead Channel Impedance Value: 380 Ohm
Lead Channel Pacing Threshold Amplitude: 0.5 V
Lead Channel Pacing Threshold Pulse Width: 0.5 ms
Lead Channel Setting Pacing Amplitude: 2.5 V
Lead Channel Setting Pacing Pulse Width: 0.5 ms
Lead Channel Setting Sensing Sensitivity: 0.5 mV
MDC IDC MSMT BATTERY REMAINING PERCENTAGE: 70 %
MDC IDC MSMT LEADCHNL RV SENSING INTR AMPL: 11.4 mV
MDC IDC SESS DTM: 20150408060033
MDC IDC SET ZONE DETECTION INTERVAL: 250 ms
Zone Setting Detection Interval: 300 ms
Zone Setting Detection Interval: 330 ms

## 2013-05-26 ENCOUNTER — Other Ambulatory Visit: Payer: Self-pay

## 2013-05-26 MED ORDER — METOPROLOL SUCCINATE ER 50 MG PO TB24: ORAL_TABLET | ORAL | Status: DC

## 2013-06-07 ENCOUNTER — Encounter: Payer: Self-pay | Admitting: *Deleted

## 2013-07-01 ENCOUNTER — Other Ambulatory Visit: Payer: Self-pay

## 2013-07-01 MED ORDER — BENAZEPRIL HCL 10 MG PO TABS: 10.0000 mg | ORAL_TABLET | Freq: Every day | ORAL | Status: DC

## 2013-08-22 ENCOUNTER — Ambulatory Visit (INDEPENDENT_AMBULATORY_CARE_PROVIDER_SITE_OTHER): Payer: Medicare Other | Admitting: *Deleted

## 2013-08-22 DIAGNOSIS — I428 Other cardiomyopathies: Secondary | ICD-10-CM

## 2013-08-22 DIAGNOSIS — I5022 Chronic systolic (congestive) heart failure: Secondary | ICD-10-CM

## 2013-08-23 NOTE — Progress Notes (Signed)
Remote ICD transmission.   

## 2013-08-25 LAB — MDC_IDC_ENUM_SESS_TYPE_REMOTE
Battery Remaining Percentage: 68 %
Battery Voltage: 2.98 V
Date Time Interrogation Session: 20150713073758
HighPow Impedance: 51 Ohm
Lead Channel Impedance Value: 530 Ohm
Lead Channel Pacing Threshold Amplitude: 0.5 V
Lead Channel Pacing Threshold Pulse Width: 0.5 ms
Lead Channel Sensing Intrinsic Amplitude: 11.4 mV
Lead Channel Setting Pacing Amplitude: 2.5 V
Lead Channel Setting Pacing Pulse Width: 0.5 ms
Lead Channel Setting Sensing Sensitivity: 0.5 mV
MDC IDC MSMT BATTERY REMAINING LONGEVITY: 78 mo
MDC IDC PG SERIAL: 783786
MDC IDC SET ZONE DETECTION INTERVAL: 300 ms
MDC IDC STAT BRADY RV PERCENT PACED: 1 %
Zone Setting Detection Interval: 250 ms
Zone Setting Detection Interval: 330 ms

## 2013-09-05 ENCOUNTER — Other Ambulatory Visit: Payer: Self-pay

## 2013-09-05 MED ORDER — SIMVASTATIN 40 MG PO TABS: 40.0000 mg | ORAL_TABLET | Freq: Every day | ORAL | Status: DC

## 2013-09-16 ENCOUNTER — Encounter: Payer: Self-pay | Admitting: Cardiology

## 2013-09-21 ENCOUNTER — Encounter: Payer: Self-pay | Admitting: Internal Medicine

## 2013-12-07 ENCOUNTER — Encounter: Payer: Self-pay | Admitting: *Deleted

## 2013-12-19 ENCOUNTER — Other Ambulatory Visit: Payer: Self-pay | Admitting: *Deleted

## 2013-12-19 MED ORDER — CLOPIDOGREL BISULFATE 75 MG PO TABS: 75.0000 mg | ORAL_TABLET | Freq: Every day | ORAL | Status: DC

## 2014-02-07 ENCOUNTER — Encounter: Payer: Self-pay | Admitting: *Deleted

## 2014-02-28 ENCOUNTER — Other Ambulatory Visit: Payer: Self-pay

## 2014-02-28 MED ORDER — CLOPIDOGREL BISULFATE 75 MG PO TABS: 75.0000 mg | ORAL_TABLET | Freq: Every day | ORAL | Status: DC

## 2014-03-14 ENCOUNTER — Encounter: Payer: Self-pay | Admitting: Internal Medicine

## 2014-03-14 ENCOUNTER — Ambulatory Visit (INDEPENDENT_AMBULATORY_CARE_PROVIDER_SITE_OTHER): Payer: Medicare Other | Admitting: Internal Medicine

## 2014-03-14 VITALS — BP 132/84 | HR 88 | Ht 73.0 in | Wt 194.6 lb

## 2014-03-14 DIAGNOSIS — I10 Essential (primary) hypertension: Secondary | ICD-10-CM

## 2014-03-14 DIAGNOSIS — I5022 Chronic systolic (congestive) heart failure: Secondary | ICD-10-CM

## 2014-03-14 DIAGNOSIS — Z95810 Presence of automatic (implantable) cardiac defibrillator: Secondary | ICD-10-CM

## 2014-03-14 LAB — MDC_IDC_ENUM_SESS_TYPE_INCLINIC
Battery Remaining Longevity: 72 mo
Brady Statistic RV Percent Paced: 0 %
HighPow Impedance: 53 Ohm
Implantable Pulse Generator Serial Number: 783786
Lead Channel Impedance Value: 425 Ohm
Lead Channel Pacing Threshold Amplitude: 0.5 V
Lead Channel Pacing Threshold Pulse Width: 0.5 ms
Lead Channel Sensing Intrinsic Amplitude: 11.4 mV
MDC IDC MSMT LEADCHNL RV PACING THRESHOLD AMPLITUDE: 0.5 V
MDC IDC MSMT LEADCHNL RV PACING THRESHOLD PULSEWIDTH: 0.5 ms
MDC IDC SESS DTM: 20160202123417
MDC IDC SET LEADCHNL RV PACING AMPLITUDE: 2.5 V
MDC IDC SET LEADCHNL RV PACING PULSEWIDTH: 0.5 ms
MDC IDC SET LEADCHNL RV SENSING SENSITIVITY: 0.5 mV
MDC IDC SET ZONE DETECTION INTERVAL: 300 ms
Zone Setting Detection Interval: 250 ms
Zone Setting Detection Interval: 330 ms

## 2014-03-14 NOTE — Assessment & Plan Note (Signed)
His St. Jude ICD is working normally. Will recheck in several months.  

## 2014-03-14 NOTE — Assessment & Plan Note (Signed)
His symptoms remain class 2. No change in medications. He is encouraged to reduce his sodium intake.

## 2014-03-14 NOTE — Patient Instructions (Signed)
Your physician wants you to follow-up in: 12 months with Dr Knox Saliva will receive a reminder letter in the mail two months in advance. If you don't receive a letter, please call our office to schedule the follow-up appointment.    Remote monitoring is used to monitor your Pacemaker of ICD from home. This monitoring reduces the number of office visits required to check your device to one time per year. It allows [place] to keep an eye on the functioning of your device to ensure it is working properly. You are scheduled for a device check from home on 06/13/14. You may send your transmission at any time that day. If you have a wireless device, the transmission will be sent automatically. After your physician reviews your transmission, you will receive a postcard with your next transmission date.

## 2014-03-14 NOTE — Progress Notes (Signed)
HPI [name] returns today for ICD followup. He is a pleasant 59 yo man with an ICM, s/p MI, chronic class 2 systolic CHF, he is status post ICD implantation. In the interim, he has done well. He denies chest pain, shortness of breath, or peripheral edema. No ICD shocks. He remains active but is not walking regularly. He notes minimal tingling sensations over the device, but denies fevers, chills, or erythema. 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   No Known Allergies   Current Outpatient Prescriptions  Medication Sig Dispense Refill  . amitriptyline (ELAVIL) 25 MG tablet Take 25 mg by mouth at bedtime as needed for sleep.     [name] aspirin 325 MG tablet Take 325 mg by mouth daily.      . benazepril (LOTENSIN) 10 MG tablet Take 1 tablet (10 mg total) by mouth daily. 30 tablet 11  . clopidogrel (PLAVIX) 75 MG tablet Take 1 tablet (75 mg total) by mouth daily. 30 tablet 3  . metoprolol succinate (TOPROL-XL) 50 MG 24 hr tablet 1/2 tab po qd as directed (Patient taking differently: Take 1/2 tablet by mouth daily as directed.) 30 tablet 6  . nitroGLYCERIN (NITROSTAT) 0.4 MG SL tablet Place 1 tablet (0.4 mg total) under the tongue every 5 (five) minutes as needed. May repeat 3 times. (Patient taking differently: Place 0.4 mg under the tongue every 5 (five) minutes as needed for chest pain. May repeat 3 times.) 25 tablet 5  . simvastatin (ZOCOR) 40 MG tablet Take 1 tablet (40 mg total) by mouth at bedtime. 30 tablet 6  . tadalafil (CIALIS) 5 MG tablet Take 1 tablet (5 mg total) by mouth daily as needed for erectile dysfunction. 10 tablet 2   No current facility-administered medications for this visit.     Past Medical History  Diagnosis Date  . CAD (coronary artery disease)     status post diaphragmatic wall infarction in 1995, treated with percutaneous transluminal coronary  angioplasty with subsequent stenting of the right coronary artery in 2002.   [name] Hyperlipidemia   . Hypertension      Intolerant to MULTIPLE CHOLESTEROL MEDICATIONS.  [name] GERD (gastroesophageal reflux disease)   . Status post cholecystectomy   . PFO (patent foramen ovale)     ROS:   All systems reviewed and negative except as noted in the HPI.   Past Surgical History  Procedure Laterality Date  . Ptca      stent of the RCA 07/24/00  . Hernia repair    . Appendectomy    . Cardiac defibrillator placement      St. Jude  . Ankle surgery    . Cholecystectomy  12/02/02     Family History  Problem Relation Age of Onset  . Coronary artery disease       History   Social History  . Marital Status: Married    Spouse Name: N/A    Number of Children: N/A  . Years of Education: N/A   Occupational History  . Not on file.   Social History Main Topics  . Smoking status: Smoker, Current Status Unknown  . Smokeless tobacco: Not on file  . Alcohol Use: Not on file  . Drug Use: Not on file  . Sexual Activity: Not on file   Other Topics Concern  . Not on file   Social History Narrative     Blood pressure 127/84, pulse 69, height 6 foot 2 inches, weight 201 pounds, body mass index 25.8  Physical Exam:  Well appearing middle-aged man, NAD HEENT: Unremarkable Neck:  No JVD, no thyromegally Lungs:  Clear with no wheezes, rales, or rhonchi. Well-healed ICD incision. HEART:  Regular rate rhythm, no murmurs, no rubs, no clicks Abd:  soft, positive bowel sounds, no organomegally, no rebound, no guarding Ext:  2 plus pulses, no edema, no cyanosis, no clubbing Skin:  No rashes no nodules Neuro:  CN II through XII intact, motor grossly intact  DEVICE  Normal device function.  See PaceArt for details.   Assess/Plan:

## 2014-03-14 NOTE — Assessment & Plan Note (Signed)
His blood pressure is well compensated. He will continue his current meds. We discussed the importance of a low sodium diet.

## 2014-03-17 ENCOUNTER — Encounter: Payer: Self-pay | Admitting: Internal Medicine

## 2014-05-07 NOTE — Progress Notes (Signed)
HPI: FU CM, CAD and prior CVA. 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 In 2002 he had a bare-metal stent to the right coronary artery. He was admitted with a stroke with symptoms of confusion and ataxia in Sept of 2011. He had a TEE which showed an ejection fraction of 30-35% with no evident thrombus. 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 His MRI showed multiple posterior circulation infarct. Because his LV function was worse he underwent cardiac catheterization in January of 2012. This revealed 25 LM. There is a 50-60% stenosis of the mid-LAD. There is a diagonal branch arising from this area with ostial 60-70% stenosis. There is an ostial 50% stenosis of the circumflex/intermediate noted. There is a patent stent in the RCA midvessel with 30-40% in-stent restenosis. The proximal and distal vessel both have 50% stenosis noted. The ejection fraction was 35%. Patient had an ICD placed February 2012. Since I last saw him, the patient has dyspnea with more extreme activities but not with routine activities. It is relieved with rest. It is not associated with chest pain. There is no orthopnea, PND or pedal edema. There is no syncope or palpitations. There is no exertional chest pain.   Current Outpatient Prescriptions  Medication Sig Dispense Refill  . amitriptyline (ELAVIL) 25 MG tablet Take 25 mg by mouth at bedtime as needed for sleep.     [name] aspirin 325 MG tablet Take 325 mg by mouth daily.      . benazepril (LOTENSIN) 10 MG tablet Take 1 tablet (10 mg total) by mouth daily. 30 tablet 11  . clopidogrel (PLAVIX) 75 MG tablet Take 1 tablet (75 mg total) by mouth daily. 30 tablet 3  . metoprolol succinate (TOPROL-XL) 50 MG 24 hr tablet 1/2 tab po qd as directed (Patient taking differently: Take 1/2 tablet by mouth daily as directed.) 30 tablet 6  . nitroGLYCERIN (NITROSTAT) 0.4 MG SL tablet Place 1 tablet (0.4 mg total)  under the tongue every 5 (five) minutes as needed. May repeat 3 times. (Patient taking differently: Place 0.4 mg under the tongue every 5 (five) minutes as needed for chest pain. May repeat 3 times.) 25 tablet 5  . simvastatin (ZOCOR) 40 MG tablet Take 1 tablet (40 mg total) by mouth at bedtime. 30 tablet 6  . tadalafil (CIALIS) 5 MG tablet Take 1 tablet (5 mg total) by mouth daily as needed for erectile dysfunction. 10 tablet 2   No current facility-administered medications for this visit.     Past Medical History  Diagnosis Date  . CAD (coronary artery disease)     status post diaphragmatic wall infarction in 1995, treated with percutaneous transluminal coronary  angioplasty with subsequent stenting of the right coronary artery in 2002.   [name] Hyperlipidemia   . Hypertension     Intolerant to MULTIPLE CHOLESTEROL MEDICATIONS.  [name] GERD (gastroesophageal reflux disease)   . Status post cholecystectomy   . PFO (patent foramen ovale)     Past Surgical History  Procedure Laterality Date  . Ptca      stent of the RCA 07/24/00  . Hernia repair    . Appendectomy    . Cardiac defibrillator placement      St. Jude  . Ankle surgery    . Cholecystectomy  12/02/02    History   Social History  . Marital Status: Married  Spouse Name: N/A  . Number of Children: N/A  . Years of Education: N/A   Occupational History  . Not on file.   Social History Main Topics  . Smoking status: Smoker, Current Status Unknown  . Smokeless tobacco: Not on file  . Alcohol Use: Not on file  . Drug Use: Not on file  . Sexual Activity: Not on file   Other Topics Concern  . Not on file   Social History Narrative    ROS: no fevers or chills, productive cough, hemoptysis, dysphasia, odynophagia, melena, hematochezia, dysuria, hematuria, rash, seizure activity, orthopnea, PND, pedal edema, claudication. Remaining systems are negative.  Physical Exam: Well-developed well-nourished in no acute distress.    Skin is warm and dry.  HEENT is normal.  Neck is supple.  Chest is clear to auscultation with normal expansion.  Cardiovascular exam is regular rate and rhythm.  Abdominal exam nontender or distended. No masses palpated. Extremities show no edema. neuro grossly intact  ECG sinus rhythm at a rate of 92. Inferior infarct. Nonspecific ST changes.

## 2014-05-09 ENCOUNTER — Encounter: Payer: Self-pay | Admitting: *Deleted

## 2014-05-09 ENCOUNTER — Ambulatory Visit (INDEPENDENT_AMBULATORY_CARE_PROVIDER_SITE_OTHER): Payer: Medicare Other | Admitting: Cardiology

## 2014-05-09 ENCOUNTER — Encounter: Payer: Self-pay | Admitting: Cardiology

## 2014-05-09 VITALS — BP 110/82 | HR 92 | Ht 73.0 in | Wt 196.8 lb

## 2014-05-09 DIAGNOSIS — Z720 Tobacco use: Secondary | ICD-10-CM | POA: Diagnosis not present

## 2014-05-09 DIAGNOSIS — I714 Abdominal aortic aneurysm, without rupture, unspecified: Secondary | ICD-10-CM

## 2014-05-09 DIAGNOSIS — I2510 Atherosclerotic heart disease of native coronary artery without angina pectoris: Secondary | ICD-10-CM

## 2014-05-09 DIAGNOSIS — F17200 Nicotine dependence, unspecified, uncomplicated: Secondary | ICD-10-CM

## 2014-05-09 MED ORDER — BENAZEPRIL HCL 10 MG PO TABS: 10.0000 mg | ORAL_TABLET | Freq: Every day | ORAL | Status: DC

## 2014-05-09 MED ORDER — METOPROLOL SUCCINATE ER 50 MG PO TB24: ORAL_TABLET | ORAL | Status: DC

## 2014-05-09 MED ORDER — SIMVASTATIN 40 MG PO TABS: 40.0000 mg | ORAL_TABLET | Freq: Every day | ORAL | Status: DC

## 2014-05-09 MED ORDER — NITROGLYCERIN 0.4 MG SL SUBL: 0.4000 mg | SUBLINGUAL_TABLET | SUBLINGUAL | Status: AC | PRN

## 2014-05-09 MED ORDER — CLOPIDOGREL BISULFATE 75 MG PO TABS: 75.0000 mg | ORAL_TABLET | Freq: Every day | ORAL | Status: DC

## 2014-05-09 NOTE — Assessment & Plan Note (Signed)
Management per electrophysiology. 

## 2014-05-09 NOTE — Assessment & Plan Note (Signed)
Continue ACE inhibitor and beta blocker. 

## 2014-05-09 NOTE — Assessment & Plan Note (Signed)
Patient counseled on discontinuing. 

## 2014-05-09 NOTE — Assessment & Plan Note (Signed)
Previous CT showed 3.2 cm. Repeat ultrasound.

## 2014-05-09 NOTE — Assessment & Plan Note (Signed)
Blood pressure controlled. Continue present medications. Check potassium and renal function. 

## 2014-05-09 NOTE — Patient Instructions (Signed)
Your physician wants you to follow-up in: [place] will receive a reminder letter in the mail two months in advance. If you don't receive a letter, please call our office to schedule the follow-up appointment.   Your physician has requested that you have an abdominal aorta duplex. During this test, an ultrasound is used to evaluate the aorta. Allow 30 minutes for this exam. Do not eat after midnight the day before and avoid carbonated beverages   Your physician recommends that you return for lab work [place]

## 2014-05-09 NOTE — Assessment & Plan Note (Signed)
Continue aspirin and statin. 

## 2014-05-09 NOTE — Assessment & Plan Note (Signed)
Continue statin. Check lipids and liver. 

## 2014-05-17 ENCOUNTER — Ambulatory Visit (HOSPITAL_COMMUNITY)
Admission: RE | Admit: 2014-05-17 | Discharge: 2014-05-17 | Disposition: A | Discharge status: Home / Self Care | Payer: Medicare Other | Source: Ambulatory Visit | Attending: Cardiovascular Disease | Admitting: Cardiovascular Disease

## 2014-05-17 DIAGNOSIS — I714 Abdominal aortic aneurysm, without rupture, unspecified: Secondary | ICD-10-CM

## 2014-05-17 NOTE — Progress Notes (Signed)
Abdominal aortic duplex completed. °Brianna L Mazza,RVT °

## 2014-05-18 LAB — BASIC METABOLIC PANEL WITH GFR
BUN: 14 mg/dL (ref 6–23)
CALCIUM: 8.8 mg/dL (ref 8.4–10.5)
CO2: 28 meq/L (ref 19–32)
Chloride: 107 mEq/L (ref 96–112)
Creat: 0.88 mg/dL (ref 0.50–1.35)
GFR, Est African American: >89 mL/min
Glucose, Bld: 106 mg/dL — ABNORMAL HIGH (ref 70–99)
Potassium: 4.4 mEq/L (ref 3.5–5.3)
Sodium: 140 mEq/L (ref 135–145)

## 2014-05-18 LAB — CBC
HCT: 45.5 % (ref 39.0–52.0)
HEMOGLOBIN: 15.8 g/dL (ref 13.0–17.0)
MCH: 29.6 pg (ref 26.0–34.0)
MCHC: 34.7 g/dL (ref 30.0–36.0)
MCV: 85.4 fL (ref 78.0–100.0)
MPV: 9.1 fL (ref 8.6–12.4)
Platelets: 204 10*3/uL (ref 150–400)
RBC: 5.33 MIL/uL (ref 4.22–5.81)
RDW: 13.8 % (ref 11.5–15.5)
WBC: 8.2 10*3/uL (ref 4.0–10.5)

## 2014-06-13 ENCOUNTER — Ambulatory Visit (INDEPENDENT_AMBULATORY_CARE_PROVIDER_SITE_OTHER): Payer: Medicare Other | Admitting: *Deleted

## 2014-06-13 DIAGNOSIS — I5022 Chronic systolic (congestive) heart failure: Secondary | ICD-10-CM

## 2014-06-13 DIAGNOSIS — Z95810 Presence of automatic (implantable) cardiac defibrillator: Secondary | ICD-10-CM | POA: Diagnosis not present

## 2014-06-13 NOTE — Progress Notes (Signed)
Remote ICD transmission.   

## 2014-06-17 LAB — CUP PACEART REMOTE DEVICE CHECK
Brady Statistic RV Percent Paced: 0 %
HIGH POWER IMPEDANCE MEASURED VALUE: 49 Ohm
Lead Channel Impedance Value: 440 Ohm
Lead Channel Pacing Threshold Amplitude: 0.5 V
Lead Channel Pacing Threshold Pulse Width: 0.5 ms
Lead Channel Setting Pacing Amplitude: 2.5 V
Lead Channel Setting Pacing Pulse Width: 0.5 ms
Lead Channel Setting Sensing Sensitivity: 0.5 mV
MDC IDC MSMT BATTERY REMAINING LONGEVITY: 71 mo
MDC IDC MSMT BATTERY REMAINING PERCENTAGE: 62 %
MDC IDC MSMT BATTERY VOLTAGE: 2.96 V
MDC IDC MSMT LEADCHNL RV SENSING INTR AMPL: 11.4 mV
MDC IDC PG SERIAL: 783786
MDC IDC SESS DTM: 20160503071950
MDC IDC SET ZONE DETECTION INTERVAL: 330 ms
Zone Setting Detection Interval: 250 ms
Zone Setting Detection Interval: 300 ms

## 2014-06-22 ENCOUNTER — Encounter: Payer: Self-pay | Admitting: Cardiology

## 2014-06-29 ENCOUNTER — Encounter: Payer: Self-pay | Admitting: Internal Medicine

## 2014-09-12 ENCOUNTER — Encounter: Payer: Medicare Other | Admitting: *Deleted

## 2014-09-13 ENCOUNTER — Encounter: Payer: Self-pay | Admitting: Cardiology

## 2014-09-26 ENCOUNTER — Telehealth: Payer: Self-pay | Admitting: Internal Medicine

## 2014-09-26 NOTE — Telephone Encounter (Signed)
New message     Pt calling in regards to letter about transmission not being received Please call to discuss

## 2014-09-26 NOTE — Telephone Encounter (Signed)
Pt wife will have pt call back.

## 2014-09-27 ENCOUNTER — Ambulatory Visit (INDEPENDENT_AMBULATORY_CARE_PROVIDER_SITE_OTHER): Payer: Medicare Other | Admitting: *Deleted

## 2014-09-27 DIAGNOSIS — I428 Other cardiomyopathies: Secondary | ICD-10-CM

## 2014-09-27 DIAGNOSIS — I429 Cardiomyopathy, unspecified: Secondary | ICD-10-CM

## 2014-09-27 DIAGNOSIS — I5022 Chronic systolic (congestive) heart failure: Secondary | ICD-10-CM

## 2014-09-27 NOTE — Progress Notes (Signed)
Remote ICD transmission.   

## 2014-09-27 NOTE — Telephone Encounter (Signed)
Spoke w/ pt and informed him that his transmission was not received. Informed pt how to send transmission. Pt verbalized understanding.

## 2014-10-04 LAB — CUP PACEART REMOTE DEVICE CHECK
Battery Remaining Longevity: 68 mo
Battery Remaining Percentage: 60 %
Battery Voltage: 2.96 V
Brady Statistic RV Percent Paced: 1 %
Date Time Interrogation Session: 20160817162851
HIGH POWER IMPEDANCE MEASURED VALUE: 48 Ohm
Lead Channel Impedance Value: 460 Ohm
Lead Channel Pacing Threshold Amplitude: 0.5 V
Lead Channel Setting Pacing Amplitude: 2.5 V
Lead Channel Setting Pacing Pulse Width: 0.5 ms
Lead Channel Setting Sensing Sensitivity: 0.5 mV
MDC IDC MSMT LEADCHNL RV PACING THRESHOLD PULSEWIDTH: 0.5 ms
MDC IDC MSMT LEADCHNL RV SENSING INTR AMPL: 11.4 mV
Pulse Gen Serial Number: 783786
Zone Setting Detection Interval: 250 ms
Zone Setting Detection Interval: 300 ms
Zone Setting Detection Interval: 330 ms

## 2014-10-10 ENCOUNTER — Telehealth: Payer: Self-pay | Admitting: Internal Medicine

## 2014-10-10 ENCOUNTER — Telehealth: Payer: Self-pay | Admitting: Cardiology

## 2014-10-10 NOTE — Telephone Encounter (Signed)
Spoke with pt he is aware Application is going to [employer] for [place] to sign.

## 2014-10-10 NOTE — Telephone Encounter (Signed)
Walk in pt form-Application for Renewal of Disability Parking Placard-dropped off by Patient-Dr.[place] will be the one completing  Sent interoffice to [facility] left patient VM making him aware

## 2014-10-11 ENCOUNTER — Encounter: Payer: Self-pay | Admitting: Cardiology

## 2014-10-12 ENCOUNTER — Telehealth: Payer: Self-pay | Admitting: *Deleted

## 2014-10-12 NOTE — Telephone Encounter (Signed)
Spoke with pt, handicap plaque is ready for the pt. Mailed to his home address at his request

## 2014-10-17 ENCOUNTER — Encounter: Payer: Self-pay | Admitting: Internal Medicine

## 2015-01-01 ENCOUNTER — Ambulatory Visit (INDEPENDENT_AMBULATORY_CARE_PROVIDER_SITE_OTHER): Payer: Medicare Other | Admitting: *Deleted

## 2015-01-01 DIAGNOSIS — I5022 Chronic systolic (congestive) heart failure: Secondary | ICD-10-CM | POA: Diagnosis not present

## 2015-01-01 DIAGNOSIS — I429 Cardiomyopathy, unspecified: Secondary | ICD-10-CM

## 2015-01-01 DIAGNOSIS — I428 Other cardiomyopathies: Secondary | ICD-10-CM | POA: Diagnosis not present

## 2015-01-01 NOTE — Progress Notes (Signed)
Remote ICD transmission.   

## 2015-01-02 LAB — CUP PACEART REMOTE DEVICE CHECK
Battery Voltage: 2.96 V
Date Time Interrogation Session: 20161121083438
HighPow Impedance: 48 Ohm
Implantable Lead Implant Date: 20120220
Implantable Lead Location: 753860
Implantable Lead Model: 185
Implantable Lead Serial Number: 346356
Lead Channel Impedance Value: 360 Ohm
Lead Channel Sensing Intrinsic Amplitude: 10.8 mV
Lead Channel Setting Pacing Pulse Width: 0.5 ms
MDC IDC MSMT BATTERY REMAINING LONGEVITY: 64 mo
MDC IDC MSMT BATTERY REMAINING PERCENTAGE: 57 %
MDC IDC SET LEADCHNL RV PACING AMPLITUDE: 2.5 V
MDC IDC SET LEADCHNL RV SENSING SENSITIVITY: 0.5 mV
MDC IDC STAT BRADY RV PERCENT PACED: 1 %
Pulse Gen Serial Number: 783786

## 2015-01-03 ENCOUNTER — Encounter: Payer: Self-pay | Admitting: Cardiology

## 2015-02-26 ENCOUNTER — Other Ambulatory Visit: Payer: Self-pay | Admitting: Cardiology

## 2015-02-26 NOTE — Telephone Encounter (Signed)
Rx request sent to pharmacy.  

## 2015-04-11 ENCOUNTER — Encounter: Payer: Self-pay | Admitting: *Deleted

## 2015-05-22 ENCOUNTER — Other Ambulatory Visit: Payer: Self-pay | Admitting: Cardiology

## 2015-05-22 NOTE — Telephone Encounter (Signed)
REFILL 

## 2015-05-24 ENCOUNTER — Ambulatory Visit (INDEPENDENT_AMBULATORY_CARE_PROVIDER_SITE_OTHER): Payer: Medicare Other | Admitting: Internal Medicine

## 2015-05-24 ENCOUNTER — Encounter: Payer: Self-pay | Admitting: Internal Medicine

## 2015-05-24 VITALS — BP 100/68 | HR 95 | Ht 72.0 in | Wt 195.2 lb

## 2015-05-24 DIAGNOSIS — I5022 Chronic systolic (congestive) heart failure: Secondary | ICD-10-CM

## 2015-05-24 NOTE — Patient Instructions (Signed)
Medication Instructions:  Your physician recommends that you continue on your current medications as directed. Please refer to the Current Medication list given to you today.   Labwork: None ordered   Testing/Procedures: None ordered   Follow-Up: Your physician wants you to follow-up in: 12 months with Dr Knox Saliva will receive a reminder letter in the mail two months in advance. If you don't receive a letter, please call our office to schedule the follow-up appointment.   Remote monitoring is used to monitor your  ICD from home. This monitoring reduces the number of office visits required to check your device to one time per year. It allows [place] to keep an eye on the functioning of your device to ensure it is working properly. You are scheduled for a device check from home on 08/23/15. You may send your transmission at any time that day. If you have a wireless device, the transmission will be sent automatically. After your physician reviews your transmission, you will receive a postcard with your next transmission date.    Any Other Special Instructions Will Be Listed Below (If Applicable).     If you need a refill on your cardiac medications before your next appointment, please call your pharmacy.

## 2015-05-24 NOTE — Progress Notes (Signed)
HPI Mr. Renfroe returns today for ICD followup. He is a pleasant 60 yo man with an ICM, s/p MI, chronic class 2 systolic CHF, he is status post ICD implantation. In the interim, he has done well. He denies chest pain, shortness of breath, or peripheral edema. No ICD shocks. He remains active but is not walking regularly because of neuropathy.  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   No Known Allergies   Current Outpatient Prescriptions  Medication Sig Dispense Refill  . acetaminophen-codeine (TYLENOL #3) 300-30 MG tablet Take 1 tablet by mouth as directed.    [name] amitriptyline (ELAVIL) 25 MG tablet Take 25 mg by mouth at bedtime as needed for sleep.     [name] aspirin 325 MG tablet Take 325 mg by mouth daily.      . benazepril (LOTENSIN) 10 MG tablet Take 1 tablet (10 mg total) by mouth daily. 30 tablet 6  . clopidogrel (PLAVIX) 75 MG tablet TAKE 1 TABLET BY MOUTH ONCE DAILY 30 tablet 2  . doxazosin (CARDURA) 1 MG tablet Take 1 tablet by mouth as directed.    . gabapentin (NEURONTIN) 600 MG tablet Take 1 tablet by mouth daily.    . metoprolol succinate (TOPROL-XL) 50 MG 24 hr tablet Take 1 tablet (50 mg total) by mouth daily. TAKE 1/2 (25MG ) TABLET DAILY. 30 tablet 0  . nitroGLYCERIN (NITROSTAT) 0.4 MG SL tablet Place 1 tablet (0.4 mg total) under the tongue every 5 (five) minutes as needed for chest pain. May repeat 3 times. 25 tablet 3  . sildenafil (REVATIO) 20 MG tablet Take 1 tablet by mouth as directed.    . simvastatin (ZOCOR) 40 MG tablet Take 1 tablet (40 mg total) by mouth at bedtime. 30 tablet 6  . tadalafil (CIALIS) 5 MG tablet Take 1 tablet (5 mg total) by mouth daily as needed for erectile dysfunction. 10 tablet 2   No current facility-administered medications for this visit.     Past Medical History  Diagnosis Date  . CAD (coronary artery disease)     status post diaphragmatic wall infarction in 1995, treated with percutaneous transluminal coronary  angioplasty with  subsequent stenting of the right coronary artery in 2002.   [name] Hyperlipidemia   . Hypertension     Intolerant to MULTIPLE CHOLESTEROL MEDICATIONS.  [name] GERD (gastroesophageal reflux disease)   . Status post cholecystectomy   . PFO (patent foramen ovale)     ROS:   All systems reviewed and negative except as noted in the HPI.   Past Surgical History  Procedure Laterality Date  . Ptca      stent of the RCA 07/24/00  . Hernia repair    . Appendectomy    . Cardiac defibrillator placement      St. Jude  . Ankle surgery    . Cholecystectomy  12/02/02     Family History  Problem Relation Age of Onset  . Coronary artery disease       Social History   Social History  . Marital Status: Married    Spouse Name: N/A  . Number of Children: N/A  . Years of Education: N/A   Occupational History  . Not on file.   Social History Main Topics  . Smoking status: Smoker, Current Status Unknown  . Smokeless tobacco: Not on file  . Alcohol Use: Not on file  . Drug Use: Not on file  . Sexual Activity: Not on file   Other Topics Concern  .  Not on file   Social History Narrative     Blood pressure 127/84, pulse 69, height 6 foot 2 inches, weight 201 pounds, body mass index 25.8 Physical Exam:  Well appearing middle-aged man, NAD HEENT: Unremarkable Neck:  No JVD, no thyromegally Lungs:  Clear with no wheezes, rales, or rhonchi. Well-healed ICD incision. HEART:  Regular rate rhythm, no murmurs, no rubs, no clicks Abd:  soft, positive bowel sounds, no organomegally, no rebound, no guarding Ext:  2 plus pulses, no edema, no cyanosis, no clubbing Skin:  No rashes no nodules Neuro:  CN II through XII intact, motor grossly intact  DEVICE  Normal device function.  See PaceArt for details.   ECG - NSR with Inferior MI  Assess/Plan:  1. Chronic systolic heart failure -his symptoms are class 2. He will continue his current meds. He is encouraged to increase his physical  activity. 2. CAD - he is s/p MI and having no anginal symptoms. He will continue his current meds. 3. ICD - his St. Jude single chamber device is working normally. Will recheck in several months.  [name].D.

## 2015-05-29 LAB — CUP PACEART INCLINIC DEVICE CHECK
Battery Remaining Longevity: 61.2
Brady Statistic RV Percent Paced: 0 %
HIGH POWER IMPEDANCE MEASURED VALUE: 48.3121
Implantable Lead Serial Number: 346356
Lead Channel Impedance Value: 450 Ohm
Lead Channel Sensing Intrinsic Amplitude: 11.4 mV
Lead Channel Setting Pacing Pulse Width: 0.5 ms
Lead Channel Setting Sensing Sensitivity: 0.5 mV
MDC IDC LEAD IMPLANT DT: 20120220
MDC IDC LEAD LOCATION: 753860
MDC IDC LEAD MODEL: 185
MDC IDC MSMT LEADCHNL RV PACING THRESHOLD AMPLITUDE: 0.75 V
MDC IDC MSMT LEADCHNL RV PACING THRESHOLD AMPLITUDE: 0.75 V
MDC IDC MSMT LEADCHNL RV PACING THRESHOLD PULSEWIDTH: 0.5 ms
MDC IDC MSMT LEADCHNL RV PACING THRESHOLD PULSEWIDTH: 0.5 ms
MDC IDC SESS DTM: 20170413202205
MDC IDC SET LEADCHNL RV PACING AMPLITUDE: 2.5 V
Pulse Gen Serial Number: 783786

## 2015-08-23 ENCOUNTER — Ambulatory Visit (INDEPENDENT_AMBULATORY_CARE_PROVIDER_SITE_OTHER): Payer: Medicare Other | Admitting: *Deleted

## 2015-08-23 DIAGNOSIS — Z95810 Presence of automatic (implantable) cardiac defibrillator: Secondary | ICD-10-CM

## 2015-08-23 DIAGNOSIS — I428 Other cardiomyopathies: Secondary | ICD-10-CM | POA: Diagnosis not present

## 2015-08-23 DIAGNOSIS — I429 Cardiomyopathy, unspecified: Secondary | ICD-10-CM

## 2015-08-23 NOTE — Progress Notes (Signed)
Remote ICD transmission.   

## 2015-08-29 ENCOUNTER — Encounter: Payer: Self-pay | Admitting: Cardiology

## 2015-08-29 LAB — CUP PACEART REMOTE DEVICE CHECK
Battery Remaining Percentage: 52 %
Battery Voltage: 2.95 V
HIGH POWER IMPEDANCE MEASURED VALUE: 48 Ohm
Implantable Lead Implant Date: 20120220
Implantable Lead Model: 185
Lead Channel Impedance Value: 440 Ohm
Lead Channel Setting Pacing Pulse Width: 0.5 ms
MDC IDC LEAD LOCATION: 753860
MDC IDC LEAD SERIAL: 346356
MDC IDC MSMT BATTERY REMAINING LONGEVITY: 60 mo
MDC IDC MSMT LEADCHNL RV SENSING INTR AMPL: 11.3 mV
MDC IDC PG SERIAL: 783786
MDC IDC SESS DTM: 20170713095428
MDC IDC SET LEADCHNL RV PACING AMPLITUDE: 2.5 V
MDC IDC SET LEADCHNL RV SENSING SENSITIVITY: 0.5 mV
MDC IDC STAT BRADY RV PERCENT PACED: 1 %

## 2015-11-22 ENCOUNTER — Ambulatory Visit (INDEPENDENT_AMBULATORY_CARE_PROVIDER_SITE_OTHER): Payer: Medicare Other | Admitting: *Deleted

## 2015-11-22 DIAGNOSIS — I428 Other cardiomyopathies: Secondary | ICD-10-CM | POA: Diagnosis not present

## 2015-11-22 DIAGNOSIS — I429 Cardiomyopathy, unspecified: Secondary | ICD-10-CM

## 2015-11-22 NOTE — Progress Notes (Signed)
Remote ICD transmission.   

## 2015-11-23 ENCOUNTER — Encounter: Payer: Self-pay | Admitting: Cardiology

## 2015-12-24 LAB — CUP PACEART REMOTE DEVICE CHECK
Battery Voltage: 2.95 V
Date Time Interrogation Session: 20171012082630
HighPow Impedance: 47 Ohm
Implantable Lead Location: 753860
Implantable Lead Serial Number: 346356
Implantable Pulse Generator Implant Date: 20120220
Lead Channel Pacing Threshold Amplitude: 0.75 V
Lead Channel Pacing Threshold Pulse Width: 0.5 ms
Lead Channel Setting Sensing Sensitivity: 0.5 mV
MDC IDC LEAD IMPLANT DT: 20120220
MDC IDC LEAD MODEL: 185
MDC IDC MSMT BATTERY REMAINING LONGEVITY: 58 mo
MDC IDC MSMT BATTERY REMAINING PERCENTAGE: 51 %
MDC IDC MSMT LEADCHNL RV IMPEDANCE VALUE: 450 Ohm
MDC IDC MSMT LEADCHNL RV SENSING INTR AMPL: 11.4 mV
MDC IDC SET LEADCHNL RV PACING AMPLITUDE: 2.5 V
MDC IDC SET LEADCHNL RV PACING PULSEWIDTH: 0.5 ms
MDC IDC STAT BRADY RV PERCENT PACED: 1 %
Pulse Gen Serial Number: 783786

## 2015-12-24 NOTE — Progress Notes (Signed)
Normal remote reviewed.  Next Merlin 02/21/16

## 2016-02-21 ENCOUNTER — Ambulatory Visit (INDEPENDENT_AMBULATORY_CARE_PROVIDER_SITE_OTHER): Payer: Medicare Other | Admitting: *Deleted

## 2016-02-21 DIAGNOSIS — I428 Other cardiomyopathies: Secondary | ICD-10-CM

## 2016-02-21 DIAGNOSIS — I429 Cardiomyopathy, unspecified: Secondary | ICD-10-CM

## 2016-02-21 NOTE — Progress Notes (Signed)
Remote ICD transmission.   

## 2016-02-22 ENCOUNTER — Encounter: Payer: Self-pay | Admitting: Cardiology

## 2016-03-06 LAB — CUP PACEART REMOTE DEVICE CHECK
Battery Remaining Longevity: 55 mo
Battery Remaining Percentage: 49 %
Battery Voltage: 2.95 V
HighPow Impedance: 46 Ohm
Implantable Lead Location: 753860
Implantable Lead Serial Number: 346356
Implantable Pulse Generator Implant Date: 20120220
Lead Channel Setting Pacing Amplitude: 2.5 V
Lead Channel Setting Pacing Pulse Width: 0.5 ms
Lead Channel Setting Sensing Sensitivity: 0.5 mV
MDC IDC LEAD IMPLANT DT: 20120220
MDC IDC MSMT LEADCHNL RV IMPEDANCE VALUE: 410 Ohm
MDC IDC MSMT LEADCHNL RV PACING THRESHOLD AMPLITUDE: 0.75 V
MDC IDC MSMT LEADCHNL RV PACING THRESHOLD PULSEWIDTH: 0.5 ms
MDC IDC MSMT LEADCHNL RV SENSING INTR AMPL: 11.4 mV
MDC IDC PG SERIAL: 783786
MDC IDC SESS DTM: 20180111105623
MDC IDC STAT BRADY RV PERCENT PACED: 1 %

## 2016-05-07 ENCOUNTER — Telehealth: Payer: Self-pay | Admitting: Physician Assistant

## 2016-05-07 NOTE — Telephone Encounter (Signed)
Received records from [facility] for appointment on 05/12/16 with Almyra Deforest, PA.  Records put with Hao's schedule for 05/12/16. lp

## 2016-05-12 ENCOUNTER — Encounter: Payer: Self-pay | Admitting: Physician Assistant

## 2016-05-12 ENCOUNTER — Ambulatory Visit (INDEPENDENT_AMBULATORY_CARE_PROVIDER_SITE_OTHER): Payer: Medicare Other | Admitting: Physician Assistant

## 2016-05-12 VITALS — BP 124/60 | HR 67 | Ht 73.0 in | Wt 191.6 lb

## 2016-05-12 DIAGNOSIS — I471 Supraventricular tachycardia: Secondary | ICD-10-CM

## 2016-05-12 DIAGNOSIS — I714 Abdominal aortic aneurysm, without rupture, unspecified: Secondary | ICD-10-CM

## 2016-05-12 DIAGNOSIS — R0683 Snoring: Secondary | ICD-10-CM

## 2016-05-12 DIAGNOSIS — G4733 Obstructive sleep apnea (adult) (pediatric): Secondary | ICD-10-CM | POA: Diagnosis not present

## 2016-05-12 DIAGNOSIS — Z95810 Presence of automatic (implantable) cardiac defibrillator: Secondary | ICD-10-CM | POA: Diagnosis not present

## 2016-05-12 DIAGNOSIS — I2510 Atherosclerotic heart disease of native coronary artery without angina pectoris: Secondary | ICD-10-CM

## 2016-05-12 DIAGNOSIS — R0681 Apnea, not elsewhere classified: Secondary | ICD-10-CM | POA: Diagnosis not present

## 2016-05-12 DIAGNOSIS — I10 Essential (primary) hypertension: Secondary | ICD-10-CM | POA: Diagnosis not present

## 2016-05-12 DIAGNOSIS — E785 Hyperlipidemia, unspecified: Secondary | ICD-10-CM | POA: Diagnosis not present

## 2016-05-12 NOTE — Progress Notes (Signed)
Cardiology Office Note    Date:  05/13/2016   ID:  Trevor, [name] [birth year], MRN [id number]  PCP:  [name], MD  Cardiologist:  [name]  Electrophysiologist: Lovena Le  Chief Complaint  Patient presents with  . Atrial Fibrillation    newly diagnosed, seen for [name]  . Hypotension    History of Present Illness:  [name] is a 61 y.o. male with PMH of ICM s/p ICD, prior CVA, HTN, HLD, OSA, and CAD. He had inferior MI in 1995 treated with PTCA. In 2002, he had bare metal stent to the right coronary artery. He had a stroke with symptom of confusion and ataxia in September 2011. TEE at that time showed ejection fraction of 30-35% with no evidence of thrombus. There was right-to-left shunt at the atrial level. 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 MRI showed multiple posterior circulation infarct. He underwent cardiac catheterization in January 2012, there was 50-60% stenosis in it LAD, ostial 60-70% stenosis in diagonal branch, ostial 50% stenosis in left circumflex/intermediate, patent stent in the mid RCA with 30-40% in-stent restenosis. EF was 35%. He had ICD placed in February 2012. His ICD is being managed by [name]. His last office visit with [name] was on 05/09/2014, at which time he was abdominal ultrasound, this showed 3.2 x 3.3 cm proximal aorta mild dilatation, and also 3.7 x 3.7 cm mid aorta dilatation. One year follow-up study was recommended.  He has been followed by [facility] family medicine, based on the record that was sent over today, he appears to have had either SVT or atrial fibrillation with RVR on 04/02/2016. There are multiple EKG showing very regular tachycardia. We will upload into Epic system. We did interrogate his device today, however he does not appear to have atrial lead, therefore we do not know if this is A. fib or SVT. He has not had any ventricular episode. Nor did the device mention any ventricular  tachyarrhythmia. Based on his wife's description, he was quite sick at the time he was also hypotensive with systolic blood pressure in the 70s. Of course, if it is SVT, it could be physiological response to the hypotension. However it could also be atrial fibrillation with RVR and its resultant hypotension. It is very difficult to determine based on the EKG alone. Fortunately, he has came out of it, today's EKG shows normal sinus rhythm. Since he has upcoming visit with [name], I will defer to [name] to see if he is having new atrial fibrillation. We did discuss potential possibility of Coumadin, eliquis and his Xarelto, we discussed benefit and risk between the traditional [place] and NOAC. He doesn't have any obvious bleeding issue. I am hesitant to start on systemic anticoagulation without obvious prove that he is having atrial fibrillation. He is also due for repeat abdominal US. On further discussion, he used to have severe obstructive sleep apnea, however he has failed to follow-up with the physician who had set it up for close to 17 years now. His CPAP machine has since been broken, his wife mention he has very loud snoring and frequent apnea episode. He wants to have the sleep study done at home as he has an ailing mother who is currently in hospice and he does not wish to leave her untended. I will refer him to Dr. Claiborne Billings for potentially home sleep study. Otherwise, he denies any obvious chest discomfort or shortness of breath. \  Past Medical History:  Diagnosis Date  . CAD (coronary artery disease)    status post diaphragmatic wall infarction in 1995, treated with percutaneous transluminal coronary  angioplasty with subsequent stenting of the right coronary artery in 2002.   [name] GERD (gastroesophageal reflux disease)   . Hyperlipidemia   . Hypertension    Intolerant to MULTIPLE CHOLESTEROL MEDICATIONS.  [name] PFO (patent foramen ovale)   . Status post cholecystectomy     Past Surgical  History:  Procedure Laterality Date  . ANKLE SURGERY    . APPENDECTOMY    . CARDIAC DEFIBRILLATOR PLACEMENT     St. Jude  . CHOLECYSTECTOMY  12/02/02  . HERNIA REPAIR    . PTCA     stent of the RCA 07/24/00    Current Medications: Outpatient Medications Prior to Visit  Medication Sig Dispense Refill  . amitriptyline (ELAVIL) 25 MG tablet Take 25 mg by mouth at bedtime as needed for sleep.     [name] aspirin 325 MG tablet Take 325 mg by mouth daily.      . benazepril (LOTENSIN) 10 MG tablet Take 1 tablet (10 mg total) by mouth daily. 30 tablet 6  . clopidogrel (PLAVIX) 75 MG tablet TAKE 1 TABLET BY MOUTH ONCE DAILY 30 tablet 2  . doxazosin (CARDURA) 1 MG tablet Take 1 tablet by mouth as directed.    . gabapentin (NEURONTIN) 600 MG tablet Take 1 tablet by mouth daily.    . metoprolol succinate (TOPROL-XL) 50 MG 24 hr tablet Take 1 tablet (50 mg total) by mouth daily. TAKE 1/2 (25MG ) TABLET DAILY. 30 tablet 0  . nitroGLYCERIN (NITROSTAT) 0.4 MG SL tablet Place 1 tablet (0.4 mg total) under the tongue every 5 (five) minutes as needed for chest pain. May repeat 3 times. 25 tablet 3  . simvastatin (ZOCOR) 40 MG tablet Take 1 tablet (40 mg total) by mouth at bedtime. 30 tablet 6  . acetaminophen-codeine (TYLENOL #3) 300-30 MG tablet Take 1 tablet by mouth as directed.    . sildenafil (REVATIO) 20 MG tablet Take 1 tablet by mouth as directed.    . tadalafil (CIALIS) 5 MG tablet Take 1 tablet (5 mg total) by mouth daily as needed for erectile dysfunction. (Patient not taking: Reported on 05/12/2016) 10 tablet 2   No facility-administered medications prior to visit.      Allergies:   Patient has no known allergies.   Social History   Social History  . Marital status: Married    Spouse name: N/A  . Number of children: N/A  . Years of education: N/A   Social History Main Topics  . Smoking status: Smoker, Current Status Unknown  . Smokeless tobacco: Never Used  . Alcohol use None  . Drug use:  Unknown  . Sexual activity: Not Asked   Other Topics Concern  . None   Social History Narrative  . None     Family History:  The patient's family history includes Dementia in his mother.   ROS:   Please see the history of present illness.    ROS All other systems reviewed and are negative.   PHYSICAL EXAM:   VS:  BP 124/60   Pulse 67   Ht 6\' 1"  (1.854 m)   Wt 191 lb 9.6 oz (86.9 kg)   BMI 25.28 kg/m    GEN: Well nourished, well developed, in no acute distress  HEENT: normal  Neck: no JVD, carotid bruits, or masses Cardiac: RRR; no murmurs, rubs, or gallops,no edema  Respiratory:  clear to auscultation bilaterally, normal work of breathing GI: soft, nontender, nondistended, + BS MS: no deformity or atrophy  Skin: warm and dry, no rash Neuro:  Alert and Oriented x 3, Strength and sensation are intact Psych: euthymic mood, full affect  Wt Readings from Last 3 Encounters:  05/12/16 191 lb 9.6 oz (86.9 kg)  05/24/15 195 lb 3.2 oz (88.5 kg)  05/09/14 196 lb 12.8 oz (89.3 kg)      Studies/Labs Reviewed:   EKG:  EKG is ordered today.  The ekg ordered today demonstrates Normal sinus rhythm without obvious ST-T wave changes.  Recent Labs: No results found for requested labs within last 8760 hours.   Lipid Panel    Component Value Date/Time   CHOL 148 02/17/2012 1032   TRIG 140.0 02/17/2012 1032   HDL 33.80 (L) 02/17/2012 1032   CHOLHDL 4 02/17/2012 1032   VLDL 28.0 02/17/2012 1032   LDLCALC 86 02/17/2012 1032    Additional studies/ records that were reviewed today include:    Echo 10/18/2009 - Left ventricle: Systolic function was moderately to severely  reduced. The estimated ejection fraction was in the range of 30%  to 35%. Diffuse hypokinesis. - Left atrium: The atrium was mildly dilated. No evidence of  thrombus in the atrial cavity or appendage. - Atrial septum: Agitated saline contrast study showed a shunt  (transthoracic imaging), There was  an atrial septal aneurysm.   Cath 02/13/2010     Abdominal aortic aneurysm [place] 05/17/2014     ASSESSMENT:    1. Atrial tachycardia ([place])   2. Abdominal aortic aneurysm (AAA) without rupture ([place])   3. Witnessed episode of apnea   4. Snoring   5. ICD (implantable cardioverter-defibrillator) in place   6. Essential hypertension   7. OSA (obstructive sleep apnea)   8. Coronary artery disease involving native coronary artery of native heart without angina pectoris   9. Hyperlipidemia, unspecified hyperlipidemia type      PLAN:  In order of problems listed above:  1. Atrial tachycardia: Unclear if this were present in atrial fibrillation, he does have history of stroke. Some portion of the EKG that was sent over appears to be more regular in nature. According to the patient, he was quite sick at the time and was also hypotensive with systolic blood pressure in the 70s, unclear if the hypotension is the result of new atrial fibrillation with RVR or atrial tachycardia could be a physiological response to the hypotension. I am hesitant to start on systemic anticoagulation without definitive proof, he has a follow-up with [name] in a few days, I will ask [name] to review his outside records to see if this is indeed atrial fibrillation.  2. AAA: 3.7 cm based on the previous study 2 years ago, he was supposed to have a repeat study a year ago, this was never done. We will repeat study  3. OSA not on CPAP: Per patient's wife, he has very loud snoring and frequent apnea episode. He has not followed up with the physician who set up he his previous machine 17 years ago. Due to the fact that he has an ailing mother who is likely near the end of her hospice, he does not wish to have the sleep study done at outside facility. He was to have a home sleep study. He will check with history of insurance and we can potentially set him up with Dr. Claiborne Billings.  4. CAD: No obvious angina  5. ICM s/p ICD: No  device firing. We did interrogate the device today, no recent ventricular ectopy. No significant ventricular tachycardia  6. HTN: Blood pressure who controlled on current dose of metoprolol.  7. Hyperlipidemia: On Zocor 40 mg daily    Medication Adjustments/Labs and Tests Ordered: Current medicines are reviewed at length with the patient today.  Concerns regarding medicines are outlined above.  Medication changes, Labs and Tests ordered today are listed in the Patient Instructions below. Patient Instructions  Medication Instructions: Almyra Deforest, PA recommends that you continue on your current medications as directed. Please refer to the Current Medication list given to you today.  Labwork: NONE ORDERED  Testing/Procedures: 1. Abdominal Aorta Aneurysm Duplex - Your physician has requested that you have an abdominal aorta duplex. During this test, an ultrasound is used to evaluate the aorta. Allow 30 minutes for this exam. Do not eat after midnight the day before and avoid carbonated beverages.  2. Home Sleep Study - Your physician has recommended that you have a sleep study. This test records several body functions during sleep, including: brain activity, eye movement, oxygen and carbon dioxide blood levels, heart rate and rhythm, breathing rate and rhythm, the flow of air through your mouth and nose, snoring, body muscle movements, and chest and belly movement.  Follow-up: Keep follow-up with Dr Lovena Le later this month.  Isaac Laud recommends that you schedule a follow-up appointment in 5-6 months with Dr Stanford Breed. You will receive a reminder letter in the mail two months in advance. If you don't receive a letter, please call our office to schedule the follow-up appointment.  If you need a refill on your cardiac medications before your next appointment, please call your pharmacy.[address]    Hilbert Corrigan, [place]  05/13/2016 7:00 AM    [facility] [place], [place],    [address] Phone: [phone]; Fax: [phone]

## 2016-05-12 NOTE — Patient Instructions (Signed)
Medication Instructions: Almyra Deforest, PA recommends that you continue on your current medications as directed. Please refer to the Current Medication list given to you today.  Labwork: NONE ORDERED  Testing/Procedures: 1. Abdominal Aorta Aneurysm Duplex - Your physician has requested that you have an abdominal aorta duplex. During this test, an ultrasound is used to evaluate the aorta. Allow 30 minutes for this exam. Do not eat after midnight the day before and avoid carbonated beverages.  2. Home Sleep Study - Your physician has recommended that you have a sleep study. This test records several body functions during sleep, including: brain activity, eye movement, oxygen and carbon dioxide blood levels, heart rate and rhythm, breathing rate and rhythm, the flow of air through your mouth and nose, snoring, body muscle movements, and chest and belly movement.  Follow-up: Keep follow-up with Dr Lovena Le later this month.  Isaac Laud recommends that you schedule a follow-up appointment in 5-6 months with Dr Stanford Breed. You will receive a reminder letter in the mail two months in advance. If you don't receive a letter, please call our office to schedule the follow-up appointment.  If you need a refill on your cardiac medications before your next appointment, please call your pharmacy.`

## 2016-05-13 ENCOUNTER — Encounter: Payer: Self-pay | Admitting: Physician Assistant

## 2016-05-16 ENCOUNTER — Other Ambulatory Visit: Payer: Self-pay | Admitting: Physician Assistant

## 2016-05-28 ENCOUNTER — Ambulatory Visit (INDEPENDENT_AMBULATORY_CARE_PROVIDER_SITE_OTHER): Payer: Medicare Other | Admitting: Internal Medicine

## 2016-05-28 ENCOUNTER — Encounter: Payer: Self-pay | Admitting: Internal Medicine

## 2016-05-28 VITALS — BP 122/76 | HR 50 | Ht 73.0 in | Wt 193.0 lb

## 2016-05-28 DIAGNOSIS — I2510 Atherosclerotic heart disease of native coronary artery without angina pectoris: Secondary | ICD-10-CM

## 2016-05-28 LAB — CUP PACEART INCLINIC DEVICE CHECK
Implantable Lead Implant Date: 20120220
Implantable Lead Location: 753860
MDC IDC LEAD SERIAL: 346356
MDC IDC PG IMPLANT DT: 20120220
MDC IDC SESS DTM: 20180418111020
Pulse Gen Serial Number: 783786

## 2016-05-28 MED ORDER — APIXABAN 2.5 MG PO TABS: 5.0000 mg | ORAL_TABLET | Freq: Two times a day (BID) | ORAL | Status: DC

## 2016-05-28 MED ORDER — APIXABAN 5 MG PO TABS: 5.0000 mg | ORAL_TABLET | Freq: Two times a day (BID) | ORAL | 3 refills | Status: DC

## 2016-05-28 NOTE — Patient Instructions (Addendum)
Medication Instructions:  STOP aspirin.  STOP plavix.   START eliquis 5 mg twice a day.   Labwork: None ordered  Testing/Procedures: None ordered  Follow-Up: Remote monitoring is used to monitor your ICD from home. This monitoring reduces the number of office visits required to check your device to one time per year. It allows [place] to keep an eye on the functioning of your device to ensure it is working properly. You are scheduled for a device check from home on 08/27/16. You may send your transmission at any time that day. If you have a wireless device, the transmission will be sent automatically. After your physician reviews your transmission, you will receive a postcard with your next transmission date.    Your physician wants you to follow-up in: 1 year with [name]. You will receive a reminder letter in the mail two months in advance. If you don't receive a letter, please call our office to schedule the follow-up appointment.   Any Other Special Instructions Will Be Listed Below (If Applicable).     If you need a refill on your cardiac medications before your next appointment, please call your pharmacy.

## 2016-05-28 NOTE — Progress Notes (Signed)
HPI [name] returns today for ICD followup. He is a pleasant 61 yo man with an ICM, s/p MI, chronic class 2 systolic CHF, he is status post ICD implantation. In the interim, he has done well. He denies chest pain, shortness of breath, or peripheral edema. No ICD shocks. He remains active but is not walking regularly because of neuropathy.  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   No Known Allergies   Current Outpatient Prescriptions  Medication Sig Dispense Refill  . amitriptyline (ELAVIL) 25 MG tablet Take 25 mg by mouth at bedtime as needed for sleep.     . benazepril (LOTENSIN) 10 MG tablet Take 1 tablet (10 mg total) by mouth daily. 30 tablet 6  . gabapentin (NEURONTIN) 600 MG tablet Take 1 tablet by mouth daily as needed (for pain).     . metoprolol succinate (TOPROL-XL) 50 MG 24 hr tablet Take 25 mg by mouth daily. Take with or immediately following a meal.    . nitroGLYCERIN (NITROSTAT) 0.4 MG SL tablet Place 1 tablet (0.4 mg total) under the tongue every 5 (five) minutes as needed for chest pain. May repeat 3 times. 25 tablet 3  . simvastatin (ZOCOR) 40 MG tablet Take 1 tablet (40 mg total) by mouth at bedtime. 30 tablet 6  . apixaban (ELIQUIS) 5 MG TABS tablet Take 1 tablet (5 mg total) by mouth 2 (two) times daily. 180 tablet 3   No current facility-administered medications for this visit.      Past Medical History:  Diagnosis Date  . CAD (coronary artery disease)    status post diaphragmatic wall infarction in 1995, treated with percutaneous transluminal coronary  angioplasty with subsequent stenting of the right coronary artery in 2002.   [name] GERD (gastroesophageal reflux disease)   . Hyperlipidemia   . Hypertension    Intolerant to MULTIPLE CHOLESTEROL MEDICATIONS.  [name] PFO (patent foramen ovale)   . Status post cholecystectomy     ROS:   All systems reviewed and negative except as noted in the HPI.   Past Surgical History:  Procedure Laterality Date   . ANKLE SURGERY    . APPENDECTOMY    . CARDIAC DEFIBRILLATOR PLACEMENT     St. Jude  . CHOLECYSTECTOMY  12/02/02  . HERNIA REPAIR    . PTCA     stent of the RCA 07/24/00     Family History  Problem Relation Age of Onset  . Coronary artery disease    . Dementia Mother     on hospice as of 05/12/2016     Social History   Social History  . Marital status: Married    Spouse name: N/A  . Number of children: N/A  . Years of education: N/A   Occupational History  . Not on file.   Social History Main Topics  . Smoking status: Smoker, Current Status Unknown  . Smokeless tobacco: Never Used  . Alcohol use No  . Drug use: No  . Sexual activity: Not on file   Other Topics Concern  . Not on file   Social History Narrative  . No narrative on file     Blood pressure 127/84, pulse 69, height 6 foot 2 inches, weight 201 pounds, body mass index 25.8 Physical Exam:  Well appearing middle-aged man, NAD HEENT: Unremarkable Neck:  No JVD, no thyromegally Lungs:  Clear with no wheezes, rales, or rhonchi. Well-healed ICD incision. HEART:  Regular rate rhythm, no murmurs, no rubs, no clicks  Abd:  soft, positive bowel sounds, no organomegally, no rebound, no guarding Ext:  2 plus pulses, no edema, no cyanosis, no clubbing Skin:  No rashes no nodules Neuro:  CN II through XII intact, motor grossly intact  DEVICE  Normal device function.  See PaceArt for details.   ECG - NSR with Inferior MI  Assess/Plan:  1. Chronic systolic heart failure -his symptoms are class 2. He will continue his current meds. He is encouraged to increase his physical activity. 2. CAD - he is s/p MI and having no anginal symptoms. He will continue his current meds. 3. ICD - his St. Jude single chamber device is working normally. Will recheck in several months.  Mikle Bosworth.D.

## 2016-06-10 ENCOUNTER — Other Ambulatory Visit (HOSPITAL_COMMUNITY): Payer: Medicare Other

## 2016-06-13 ENCOUNTER — Inpatient Hospital Stay (HOSPITAL_COMMUNITY): Admission: RE | Admit: 2016-06-13 | Payer: Medicare Other | Source: Ambulatory Visit

## 2016-06-24 ENCOUNTER — Ambulatory Visit (HOSPITAL_BASED_OUTPATIENT_CLINIC_OR_DEPARTMENT_OTHER): Payer: Medicare Other

## 2016-06-26 ENCOUNTER — Telehealth: Payer: Self-pay | Admitting: Internal Medicine

## 2016-06-26 NOTE — Telephone Encounter (Signed)
Called, pt unavailable. Spoke with pt's wife, Donia Guiles. Wife is not listed on DPR. Requested pt to call back to discuss Eliquis.

## 2016-06-26 NOTE — Telephone Encounter (Signed)
°  New Prob   Pt states he is unable to afford Eliquis. Requesting possible options/discounts. Please call.

## 2016-06-30 NOTE — Telephone Encounter (Signed)
Called, spoke with pt. Pt stated he is unable to afford  Eliquis. Informed I have 2 week supply of Eliquis samples he can pick up at our office. Gave information about 2 programs to help with medication cost. Informed I will have an application for assistance from the manufacture of the medication ([place]) enclosed in the bag with samples. Pt understands he must fill out his portion of the application and bring it back to our office for [name] to fill out and sign. Also, informed pt to call [employer]  - [phone] - to apply for medication assistance. Pt verbalized understanding and thanked me for calling.

## 2016-06-30 NOTE — Telephone Encounter (Signed)
New Message    Needs to speak to you about his Eliquis

## 2016-07-15 ENCOUNTER — Telehealth: Payer: Self-pay | Admitting: Internal Medicine

## 2016-07-15 NOTE — Telephone Encounter (Signed)
New message     Altha Harm from [facility] is calling to find out if pt has a diagnosis of heart failure or CHF. He was referred to their heart health program.

## 2016-07-16 NOTE — Telephone Encounter (Signed)
New message     Needs to know if pt has been diagnosed with Congestive Heart failure

## 2016-07-18 ENCOUNTER — Other Ambulatory Visit: Payer: Self-pay | Admitting: Family Medicine

## 2016-07-18 ENCOUNTER — Ambulatory Visit
Admission: RE | Admit: 2016-07-18 | Discharge: 2016-07-18 | Disposition: A | Discharge status: Home / Self Care | Payer: Medicare Other | Source: Ambulatory Visit | Attending: Family Medicine | Admitting: Family Medicine

## 2016-07-18 ENCOUNTER — Telehealth: Payer: Self-pay | Admitting: Internal Medicine

## 2016-07-18 DIAGNOSIS — G629 Polyneuropathy, unspecified: Secondary | ICD-10-CM

## 2016-07-18 NOTE — Telephone Encounter (Signed)
Walk in pt Form-[employer] paperwork dropped off. Will give to [employer] Monday

## 2016-07-22 ENCOUNTER — Ambulatory Visit (HOSPITAL_BASED_OUTPATIENT_CLINIC_OR_DEPARTMENT_OTHER): Payer: Medicare Other | Attending: Physician Assistant | Admitting: Cardiovascular Disease

## 2016-07-22 VITALS — Ht 73.0 in | Wt 193.0 lb

## 2016-07-22 DIAGNOSIS — G4730 Sleep apnea, unspecified: Secondary | ICD-10-CM | POA: Insufficient documentation

## 2016-07-22 DIAGNOSIS — G4733 Obstructive sleep apnea (adult) (pediatric): Secondary | ICD-10-CM | POA: Diagnosis not present

## 2016-07-25 ENCOUNTER — Ambulatory Visit (HOSPITAL_COMMUNITY)
Admission: RE | Admit: 2016-07-25 | Discharge: 2016-07-25 | Disposition: A | Discharge status: Home / Self Care | Payer: Medicare Other | Source: Ambulatory Visit | Attending: Cardiovascular Disease | Admitting: Cardiovascular Disease

## 2016-07-25 DIAGNOSIS — Z8673 Personal history of transient ischemic attack (TIA), and cerebral infarction without residual deficits: Secondary | ICD-10-CM | POA: Diagnosis not present

## 2016-07-25 DIAGNOSIS — I2510 Atherosclerotic heart disease of native coronary artery without angina pectoris: Secondary | ICD-10-CM | POA: Insufficient documentation

## 2016-07-25 DIAGNOSIS — I10 Essential (primary) hypertension: Secondary | ICD-10-CM | POA: Insufficient documentation

## 2016-07-25 DIAGNOSIS — I714 Abdominal aortic aneurysm, without rupture, unspecified: Secondary | ICD-10-CM

## 2016-07-25 DIAGNOSIS — E785 Hyperlipidemia, unspecified: Secondary | ICD-10-CM | POA: Insufficient documentation

## 2016-07-25 DIAGNOSIS — I708 Atherosclerosis of other arteries: Secondary | ICD-10-CM | POA: Insufficient documentation

## 2016-07-25 DIAGNOSIS — Z720 Tobacco use: Secondary | ICD-10-CM | POA: Insufficient documentation

## 2016-07-25 DIAGNOSIS — I700 Atherosclerosis of aorta: Secondary | ICD-10-CM | POA: Insufficient documentation

## 2016-07-29 ENCOUNTER — Telehealth: Payer: Self-pay | Admitting: *Deleted

## 2016-07-29 DIAGNOSIS — I714 Abdominal aortic aneurysm, without rupture, unspecified: Secondary | ICD-10-CM

## 2016-07-29 NOTE — Telephone Encounter (Signed)
-----   Message from [place], [place] sent at 07/25/2016  5:10 PM EDT ----- Size of abdominal aortic aneurysm has not changed much compare to 2016, in 2016 it was 3.7x3.7, now 3.9x4.0. He does have some > 50% blockage in the leg. Recommend repeat image in 1 year

## 2016-07-29 NOTE — Telephone Encounter (Signed)
AAA results discussed w patient including size progression and recommended recheck interval. Pt agreeable for me to submit order for future scheduling.

## 2016-08-06 ENCOUNTER — Ambulatory Visit (INDEPENDENT_AMBULATORY_CARE_PROVIDER_SITE_OTHER): Payer: Medicare Other | Admitting: Podiatry

## 2016-08-06 ENCOUNTER — Ambulatory Visit: Payer: Medicare Other

## 2016-08-06 ENCOUNTER — Encounter: Payer: Self-pay | Admitting: Podiatry

## 2016-08-06 VITALS — BP 107/66 | HR 78 | Resp 16 | Ht 74.0 in | Wt 195.0 lb

## 2016-08-06 DIAGNOSIS — M79671 Pain in right foot: Secondary | ICD-10-CM

## 2016-08-06 DIAGNOSIS — L02619 Cutaneous abscess of unspecified foot: Secondary | ICD-10-CM

## 2016-08-06 DIAGNOSIS — L03119 Cellulitis of unspecified part of limb: Secondary | ICD-10-CM | POA: Diagnosis not present

## 2016-08-06 NOTE — Progress Notes (Signed)
Subjective:    Patient ID: [name], male   DOB: 61 y.o.   MRN: [id number]   HPI patient states she's been having a lot of problems with his right foot and states he has a area of irritation underneath the right first metatarsal and also has digits that have been moderately red and upon questioning states he does get cramping in his right over left leg and has had a history of cardiac problems    Review of Systems  All other systems reviewed and are negative.       Objective:  Physical Exam  Constitutional: He is oriented to person, place, and time.  Musculoskeletal: Normal range of motion.  Neurological: He is alert and oriented to person, place, and time.  Skin: Skin is warm and dry.   vascular status right was found to be significantly diminished with diminished pulses DP PT in comparison to left. I also noted diminished hair growth and discoloration of the distal toes with a lesion underneath the first metatarsal right that has localized redness irritation with no proximal edema erythema or drainage noted     Assessment:    Probability for circulatory issues right secondary to poor overall health the fact he's a significant cigarette smoker and the fact that he has had cardiac issues. Also is found to have a small abscess plantar aspect right first metatarsal localized in nature with no proximal edema erythema drainage     Plan:    H&P condition reviewed at great length. I am sending him for vascular analysis right and I did discuss the possibility that there may be some form of issue with his circulatory system. I then using sterile sharp instrumentation debrided the area after anesthetizing it and after sterile prep. I found to be a small area of drainage which I cleaned out flushed and applied sterile dressing to and I discussed and explained him any other redness irritation or other indications used to go straight to the emergency room and reappoint to see [place]

## 2016-08-06 NOTE — Progress Notes (Signed)
   Subjective:    Patient ID: [name], male    DOB: [birth year], 61 y.o.   MRN: [id number]  HPI Chief Complaint  Patient presents with  . Painful lesion    Right foot; plantar forefoot-below great toe; x6 months  . Foot Pain    Right foot; dorsal - x1-2 month; great toe-joint      Review of Systems  HENT: Positive for ear pain and trouble swallowing.   Musculoskeletal: Positive for gait problem and myalgias.  Hematological: Bruises/bleeds easily.  All other systems reviewed and are negative.      Objective:   Physical Exam        Assessment & Plan:

## 2016-08-08 ENCOUNTER — Telehealth: Payer: Self-pay

## 2016-08-08 NOTE — Telephone Encounter (Signed)
The pt returned his pt assistance application for Eliquis to the office on 07/18/16. I just received the application today from Dr Forde Dandy nurse and the statement from the pharmacy that shows that the pt has spent 3% out of pocket on his medications was not included.  I called the pt and advised him that the statement from his pharmacy is needed and that without it he will most likely be denied pt assistance.He states that he will go by his pharmacy and will bring form to [place] when he gets it.

## 2016-08-18 ENCOUNTER — Telehealth: Payer: Self-pay | Admitting: Cardiology

## 2016-08-18 ENCOUNTER — Telehealth: Payer: Self-pay | Admitting: Cardiovascular Disease

## 2016-08-18 DIAGNOSIS — G4733 Obstructive sleep apnea (adult) (pediatric): Secondary | ICD-10-CM

## 2016-08-18 NOTE — Telephone Encounter (Signed)
Spoke with pt, he has been talking with lynn via regarding patient assistance for the eliquis. He has the paperwork regarding how much he has spent on medications for the year. He is requesting lynn call him back, will forward to lynn via lpn

## 2016-08-18 NOTE — Telephone Encounter (Signed)
**Note De-identified Rechel Delosreyes Obfuscation** LMTCB

## 2016-08-18 NOTE — Telephone Encounter (Signed)
Patient calling the office for samples of medication:   1.  What medication and dosage are you requesting samples for? Eliquis 5 mg  2.  Are you currently out of this medication? Almost out

## 2016-08-18 NOTE — Telephone Encounter (Signed)
Patient states that he has not received results from sleep study and would like an update.

## 2016-08-18 NOTE — Telephone Encounter (Signed)
F/U Call:  Patient calling, states that he has some information to give you.

## 2016-08-18 NOTE — Telephone Encounter (Signed)
New Message ° ° pt verbalized that he is returning call for rn  °

## 2016-08-18 NOTE — Telephone Encounter (Signed)
The pt is requesting samples of Eliquis and states that he has the print out from his pharmacy that shows how much money he has paid out of pocket for his medications.  I advised him that I am leaving him 3 boxes of Eliquis in our front office for him to pick up when he brings print out to me so I can fax in his pt assistance application to [employer].  He verbalized understanding and states that he will come to this office tomorrow morning to receive samples of Eliquis and to leave the print out from his pharmacy.

## 2016-08-18 NOTE — Telephone Encounter (Signed)
error 

## 2016-08-18 NOTE — Telephone Encounter (Signed)
LMTCB

## 2016-08-18 NOTE — Telephone Encounter (Deleted)
Patient aware samples are at the front desk for pick up  

## 2016-08-18 NOTE — Telephone Encounter (Signed)
**Note De-Identified Blayne Frankie Obfuscation** See phone note from 08/08/16.

## 2016-08-18 NOTE — Telephone Encounter (Signed)
**Note De-identified Murdis Flitton Obfuscation** LMTCB

## 2016-08-18 NOTE — Telephone Encounter (Signed)
Follow up   Pt requests call around 3 or 4 today

## 2016-08-19 ENCOUNTER — Other Ambulatory Visit: Payer: Self-pay

## 2016-08-19 MED ORDER — APIXABAN 5 MG PO TABS: 5.0000 mg | ORAL_TABLET | Freq: Two times a day (BID) | ORAL | 3 refills | Status: DC

## 2016-08-19 NOTE — Telephone Encounter (Signed)
Received a fax from [place] stating that they have received the pts pt assistance application and that they will respond within 2 business days.

## 2016-08-19 NOTE — Telephone Encounter (Addendum)
The pt did come by the office today to pick up his Eliquis samples and he did leave his statement from his pharmacy showing his out of pocket expense.  I have faxed all information to [place] pt assistance foundation. Awaiting response.

## 2016-08-25 NOTE — Procedures (Signed)
    Patient Name: [name], [name] Date: 07/22/2016 Gender: Male D.O.B: 07-Nov-1955 Age (years): 85 Referring Provider: Almyra Deforest PA Height (inches): 72 Interpreting Physician: Shelva Majestic MD, ABSM Weight (lbs): 193 RPSGT: Jacolyn Reedy BMI: 26 MRN: [id number] Neck Size: 17.00  CLINICAL INFORMATION Sleep Study Type: Home Sleep Test  Indication for sleep study: Snoring, Witnessed Apneas  Epworth Sleepiness Score: 4  SLEEP STUDY TECHNIQUE A multi-channel overnight portable sleep study was performed. The channels recorded were: nasal airflow, thoracic respiratory movement, and oxygen saturation with a pulse oximetry. Snoring was also monitored.  MEDICATIONS * amitriptyline (ELAVIL) 25 MG tablet Take 25 mg by mouth at bedtime as needed for sleep.   * benazepril (LOTENSIN) 10 MG tablet Take 1 tablet (10 mg total) by mouth daily. 30 tablet 6 * gabapentin (NEURONTIN) 600 MG tablet Take 1 tablet by mouth daily as needed (for pain).   * metoprolol succinate (TOPROL-XL) 50 MG 24 hr tablet Take 25 mg by mouth daily. Take with or immediately following a meal.   * nitroGLYCERIN (NITROSTAT) 0.4 MG SL tablet Place 1 tablet (0.4 mg total) under the tongue every 5 (five) minutes as needed for chest pain. May repeat 3 times. 25 tablet 3 * simvastatin (ZOCOR) 40 MG tablet Take 1 tablet (40 mg total) by mouth at bedtime. 30 tablet 6 * apixaban (ELIQUIS) 5 MG TABS tablet Take 1 tablet (5 mg total) by mouth 2 (two) times daily. 180 tablet 3   Patient self administered medications include: N/A.  SLEEP ARCHITECTURE Patient was studied for 373.5 minutes. The sleep efficiency was 94.6 % and the patient was supine for 78.4%. The arousal index was 0.0 per hour.  RESPIRATORY PARAMETERS The overall AHI was 28.6 per hour, with a central apnea index of 0.0 per hour.  There is a significant positional component with supine sleep, AHI 35.4/h and nonsupine sleep AHI 3.73/h.  The oxygen nadir was  73% during sleep.  CARDIAC DATA Mean heart rate during sleep was 60.1 bpm. The lowest heart rate was bradycardia at 42 bpm  IMPRESSIONS - Moderate obstructive sleep apnea occurred during this study (AHI = 28.6/h). - No significant central sleep apnea occurred during this study (CAI = 0.0/h). - Severe oxygen desaturation to a nadir of 73%. Time below 89% saturation was 24.6 minutes. - Patient snored 47.3% during the sleep.  DIAGNOSIS - Obstructive Sleep Apnea (327.23 [G47.33 ICD-10]) - Nocturnal Hypoxemia (327.26 [G47.36 ICD-10])  RECOMMENDATIONS - In this patient with significant cardiovascular comorbidities including CAD, s/p prior MI, with an ischemic cardiomyopathy and ICD implantation, recommend an in-lab CPAP titration study for optimal evaluation of sleep disordered breathing and assessment of REM sleep. - Effort should be made to optimize nasal and oropharyngeal patency - Avoid alcohol, sedatives and other CNS depressants that may worsen sleep apnea and disrupt normal sleep architecture. - Sleep hygiene should be reviewed to assess factors that may improve sleep quality. - Weight management and regular exercise should be initiated or continued. - Recommend sleep clinic evaluation and follow-up of his CPAP titration trial -    [Electronically signed] 08/25/2016 11:34 PM  Shelva Majestic MD, [facility], ABSM Diplomate, American Board of Sleep Medicine   NPI: [id number] [place] PH: [phone]   FX: [phone] [place]

## 2016-08-26 ENCOUNTER — Telehealth: Payer: Self-pay | Admitting: *Deleted

## 2016-08-26 NOTE — Telephone Encounter (Signed)
-----   Message from Troy Sine, MD sent at 08/25/2016 11:38 PM EDT ----- Mariann Laster, please check with insurance company and try to arrange a CPAP titration trial to be done in lab.  The patient had had an initial home study.  He has significant cardiovascular comorbidities and would benefit from an in lab rather than auto titration at home.

## 2016-08-26 NOTE — Telephone Encounter (Signed)
[name], please check with insurance company and try to arrange a CPAP titration trial to be done in lab. The patient had had an initial home study. He has significant cardiovascular comorbidities and would benefit from an in lab rather than auto titration at home.    Per pre-cert department, no pre-cert required. CPAP titration scheduled for 10-06-16 @ 8 pm. Patient made aware.

## 2016-08-27 ENCOUNTER — Ambulatory Visit (INDEPENDENT_AMBULATORY_CARE_PROVIDER_SITE_OTHER): Payer: Medicare Other | Admitting: *Deleted

## 2016-08-27 DIAGNOSIS — I428 Other cardiomyopathies: Secondary | ICD-10-CM | POA: Diagnosis not present

## 2016-08-27 DIAGNOSIS — I429 Cardiomyopathy, unspecified: Secondary | ICD-10-CM

## 2016-08-27 LAB — CUP PACEART REMOTE DEVICE CHECK
Battery Voltage: 2.93 V
Brady Statistic RV Percent Paced: 1 %
Date Time Interrogation Session: 20180718080509
HIGH POWER IMPEDANCE MEASURED VALUE: 50 Ohm
Implantable Lead Serial Number: 346356
Lead Channel Impedance Value: 450 Ohm
Lead Channel Pacing Threshold Amplitude: 0.5 V
Lead Channel Sensing Intrinsic Amplitude: 11.4 mV
MDC IDC LEAD IMPLANT DT: 20120220
MDC IDC LEAD LOCATION: 753860
MDC IDC MSMT BATTERY REMAINING LONGEVITY: 49 mo
MDC IDC MSMT BATTERY REMAINING PERCENTAGE: 44 %
MDC IDC MSMT LEADCHNL RV PACING THRESHOLD PULSEWIDTH: 0.5 ms
MDC IDC PG IMPLANT DT: 20120220
MDC IDC SET LEADCHNL RV PACING AMPLITUDE: 2.5 V
MDC IDC SET LEADCHNL RV PACING PULSEWIDTH: 0.5 ms
MDC IDC SET LEADCHNL RV SENSING SENSITIVITY: 0.5 mV
Pulse Gen Serial Number: 783786

## 2016-08-27 NOTE — Progress Notes (Signed)
Remote ICD transmission.   

## 2016-08-28 ENCOUNTER — Encounter: Payer: Self-pay | Admitting: Cardiology

## 2016-08-28 NOTE — Telephone Encounter (Signed)
Discussed with pre-cert, CPAP titration study scheduled at the hospital.

## 2016-09-01 ENCOUNTER — Encounter (HOSPITAL_COMMUNITY): Payer: Medicare Other

## 2016-09-01 ENCOUNTER — Ambulatory Visit (HOSPITAL_COMMUNITY)
Admission: RE | Admit: 2016-09-01 | Discharge: 2016-09-01 | Disposition: A | Discharge status: Home / Self Care | Payer: Medicare Other | Source: Ambulatory Visit | Attending: Cardiology | Admitting: Cardiology

## 2016-09-01 DIAGNOSIS — M79671 Pain in right foot: Secondary | ICD-10-CM

## 2016-09-01 DIAGNOSIS — I724 Aneurysm of artery of lower extremity: Secondary | ICD-10-CM | POA: Insufficient documentation

## 2016-09-01 DIAGNOSIS — I743 Embolism and thrombosis of arteries of the lower extremities: Secondary | ICD-10-CM | POA: Insufficient documentation

## 2016-09-01 DIAGNOSIS — R9439 Abnormal result of other cardiovascular function study: Secondary | ICD-10-CM | POA: Insufficient documentation

## 2016-09-01 DIAGNOSIS — I700 Atherosclerosis of aorta: Secondary | ICD-10-CM | POA: Diagnosis not present

## 2016-09-01 DIAGNOSIS — I739 Peripheral vascular disease, unspecified: Secondary | ICD-10-CM | POA: Diagnosis not present

## 2016-09-02 ENCOUNTER — Ambulatory Visit (INDEPENDENT_AMBULATORY_CARE_PROVIDER_SITE_OTHER): Payer: Medicare Other | Admitting: Cardiovascular Disease

## 2016-09-02 DIAGNOSIS — E785 Hyperlipidemia, unspecified: Secondary | ICD-10-CM

## 2016-09-02 DIAGNOSIS — Z01818 Encounter for other preprocedural examination: Secondary | ICD-10-CM

## 2016-09-02 DIAGNOSIS — I739 Peripheral vascular disease, unspecified: Secondary | ICD-10-CM

## 2016-09-02 DIAGNOSIS — Z720 Tobacco use: Secondary | ICD-10-CM

## 2016-09-02 MED ORDER — ASPIRIN EC 81 MG PO TBEC: 81.0000 mg | DELAYED_RELEASE_TABLET | Freq: Every day | ORAL | 3 refills | Status: AC

## 2016-09-02 NOTE — Progress Notes (Signed)
Cardiology Office Note   Date:  09/02/2016   ID:  [name], [name] [birth year], MRN [id number]  PCP:  Trevor Downing, MD  Cardiologist:  Dr. Dennard [name]  No chief complaint on file.     History of Present Illness: ROLLAN [name] is a 61 y.o. male who was referred by Dr. Paulla [name] for evaluation of peripheral arterial disease. He has known history of coronary artery disease with previous inferior myocardial infarction with prior stent placement to the right coronary artery, ischemic cardiomyopathy status post ICD placement, previous CVA, hypertension, hyperlipidemia, obstructive sleep apnea, small size abdominal aortic aneurysm, recent atrial fibrillation and prolonged history of tobacco use. He was recently started on anticoagulation with Xarelto. He has been told about peripheral neuropathy for many years. He complains of severe bilateral calf pain with minimal walking worse on the right side. He also describes burning sensation in both feet at night with cramps at rest. Both feet are cold. He developed small callus on the bottom of the right foot few months ago and that has not healed. He underwent recent noninvasive vascular evaluation in our office. His ABI was critically low on the right side with no detectable flow in the digits. It was low on the left side. There was evidence of inflow disease worse on the right side with bilateral SFA occlusion. The left popliteal artery was occluded. 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    Past Medical History:  Diagnosis Date  . CAD (coronary artery disease)    status post diaphragmatic wall infarction in 1995, treated with percutaneous transluminal coronary  angioplasty with subsequent stenting of the right coronary artery in 2002.   [name] GERD (gastroesophageal reflux disease)   . Hyperlipidemia   . Hypertension    Intolerant to MULTIPLE CHOLESTEROL MEDICATIONS.  [name] PFO (patent  foramen ovale)   . Status post cholecystectomy     Past Surgical History:  Procedure Laterality Date  . ANKLE SURGERY    . APPENDECTOMY    . CARDIAC DEFIBRILLATOR PLACEMENT     [employer]  . CHOLECYSTECTOMY  12/02/02  . HERNIA REPAIR    . PTCA     stent of the RCA 07/24/00     Current Outpatient Prescriptions  Medication Sig Dispense Refill  . amitriptyline (ELAVIL) 25 MG tablet Take 25 mg by mouth at bedtime as needed for sleep.     [name] apixaban (ELIQUIS) 5 MG TABS tablet Take 1 tablet (5 mg total) by mouth 2 (two) times daily. 180 tablet 3  . aspirin EC 81 MG tablet Take 1 tablet (81 mg total) by mouth daily. 90 tablet 3  . benazepril (LOTENSIN) 10 MG tablet Take 1 tablet (10 mg total) by mouth daily. 30 tablet 6  . gabapentin (NEURONTIN) 600 MG tablet Take 1 tablet by mouth daily as needed (for pain).     . metoprolol succinate (TOPROL-XL) 50 MG 24 hr tablet Take 25 mg by mouth daily. Take with or immediately following a meal.    . nitroGLYCERIN (NITROSTAT) 0.4 MG SL tablet Place 1 tablet (0.4 mg total) under the tongue every 5 (five) minutes as needed for chest pain. May repeat 3 times. 25 tablet 3  . simvastatin (ZOCOR) 40 MG tablet Take 1 tablet (40 mg total) by mouth at bedtime. 30 tablet 6   No current facility-administered medications for this visit.     Allergies:   Patient has no  known allergies.    Social History:  The patient  reports that he has been smoking.  He has never used smokeless tobacco. He reports that he does not drink alcohol or use drugs.   Family History:  The patient's family history includes Coronary artery disease in his unknown relative; Dementia in his mother.    ROS:  Please see the history of present illness.   Otherwise, review of systems are positive for none.   All other systems are reviewed and negative.    PHYSICAL EXAM: VS:  There were no vitals taken for this visit. , BMI There is no height or weight on file to calculate BMI. GEN: Well  nourished, well developed, in no acute distress  HEENT: normal  Neck: no JVD, carotid bruits, or masses Cardiac: RRR; no murmurs, rubs, or gallops,no edema  Respiratory:  clear to auscultation bilaterally, normal work of breathing GI: soft, nontender, nondistended, + BS MS: no deformity or atrophy  Skin: warm and dry, no rash Neuro:  Strength and sensation are intact Psych: euthymic mood, full affect Vascular: Femoral pulses barely palpable on the right side and +2 on the left. Distal pulses are not palpable. He has dependent rubor on the right side. There is small ulceration at the bottom of the right metatarsal area.  EKG:  EKG is not ordered today.    Recent Labs: No results found for requested labs within last 8760 hours.    Lipid Panel    Component Value Date/Time   CHOL 148 02/17/2012 1032   TRIG 140.0 02/17/2012 1032   HDL 33.80 (L) 02/17/2012 1032   CHOLHDL 4 02/17/2012 1032   VLDL 28.0 02/17/2012 1032   LDLCALC 86 02/17/2012 1032      Wt Readings from Last 3 Encounters:  08/06/16 195 lb (88.5 kg)  07/22/16 193 lb (87.5 kg)  05/28/16 193 lb (87.5 kg)       No flowsheet data found.    ASSESSMENT AND PLAN:  1.  Peripheral arterial disease with critical limb ischemia affecting the right lower extremity. He has evidence of disease on both sides but worse on the right side likely due to worsening inflow disease on the right side as his right femoral pulse is barely palpable. I recommend proceeding with abdominal aortogram with lower extremity angiography and possible endovascular intervention. I discussed the procedure in details as well as risks and benefits.. Plan access is via the left common femoral artery. We might need to proceed with staged intervention on his iliac system first and see how he responds. SFA disease might require surgical revascularization given chronicity of his symptoms and occlusion. Hold Eliquis 2 days before the procedure. I added  aspirin 81 mg daily.  2. Tobacco use: I had a prolonged discussion with him about the importance of smoking cessation. I explained to him the high risk of progression to limb loss with continued tobacco use.  3. Hyperlipidemia: Continue treatment with simvastatin with a target LDL of less than 70.    Disposition:   FU with me in 1 month  Signed,  Kathlyn Sacramento, MD  09/02/2016 6:25 PM    [place]

## 2016-09-02 NOTE — Patient Instructions (Addendum)
Medication Instructions: START Aspirin 81 mg tablet daily   If you need a refill on your cardiac medications before your next appointment, please call your pharmacy.       [facility] [address] [place] [address] Dept: [phone] Loc: [place]  09/02/2016  You are scheduled for a Peripheral Angiogram on Wednesday, August 8 at 8:30 am with Dr. Kathlyn Sacramento.  1. Please arrive at the [facility] (Main Entrance A) at [facility]: [address] [place], [place] [address] at 6:30 AM (two hours before your procedure to ensure your preparation). Free valet parking service is available.   Special note: Every effort is made to have your procedure done on time. Please understand that emergencies sometimes delay scheduled procedures.  2. Diet: Do not eat or drink anything after midnight prior to your procedure except sips of water to take medications.  3. Labs: You will need to have blood drawn at [place], [place]  Open: 8am - 4:30. You do not need to be fasting. Please have them done by August 3rd.  4. Medication instructions in preparation for your procedure:  Please hold Eliquis two days prior to the procedure.   On the morning of your procedure, take your Aspirin and any morning medicines NOT listed above.  You may use sips of water.  5. Plan for one night stay--bring personal belongings. 6. Bring a current list of your medications and current insurance cards. 7. You MUST have a responsible person to drive you home. 8. Someone MUST be with you the first 24 hours after you arrive home or your discharge will be delayed. 9. Please wear clothes that are easy to get on and off and wear slip-on shoes.  Thank you for allowing [place] to care for you!   -- [facility]

## 2016-09-04 NOTE — Telephone Encounter (Signed)
If no pre cert is required there is nothing else needed. Appointment has been scheduled and patient notified.

## 2016-09-08 ENCOUNTER — Telehealth: Payer: Self-pay

## 2016-09-08 NOTE — Telephone Encounter (Addendum)
The pt has been denied assistance through [employer] for [employer].  Denial reason: Product covered by the pts insurance company.  I attempted to do a tier exception through the pts insurance [facility]) but received the following message: [employer] Medicare 2018's published formulary we have determined that preferred agents for your patients are likely: Warfarin- tier 1 Xarelto- tier 3  Will forward message to Dr Lovena Le for advisement.[name]

## 2016-09-09 ENCOUNTER — Other Ambulatory Visit: Payer: Self-pay

## 2016-09-09 MED ORDER — WARFARIN SODIUM 5 MG PO TABS: 5.0000 mg | ORAL_TABLET | Freq: Every day | ORAL | 0 refills | Status: DC

## 2016-09-09 NOTE — Addendum Note (Signed)
Addended by: Dennie Fetters on: 09/09/2016 12:54 PM   Modules accepted: Orders

## 2016-09-09 NOTE — Telephone Encounter (Signed)
The pt is advised of Dr Forde Dandy recommendations and he has decided to take Coumadin. Per the pt he has a couple weeks left of Eliquis and wants to finish taking before he starts taking Coumadin. He is advised that he needs to be seen in our Coumadin clinic for an INR check 5 days after he starts taking Coumadin.  He states that he thinks it will be less expensive for him if he has his INRs checked at his PCPs office.  He is advised to call his PCP and make an appt to have his INR checked 5 days after starting Coumadin and that just to be sure his INR is checked at the appropriate time I will ask our schedulers to give him a call in a week or so to schedule a Coumadin visit and that if he has arranged to have this done at his PCPs office he can let them know when they call him.  I expressed the importance of taking Coumadin as directed and of having his INR checked routinely.  He verbalized understanding.  Warfarin RX sent to [facility] to fill per the pts request.

## 2016-09-09 NOTE — Telephone Encounter (Signed)
We can start warfarin 5 mg daily or he can pay for the Eliquis. If he chooses warfarin he will need a followup in 5 days in coumadin clinic for INR. GT

## 2016-09-11 LAB — BASIC METABOLIC PANEL
BUN/Creatinine Ratio: 11 (ref 9–20)
BUN: 10 mg/dL (ref 6–24)
CHLORIDE: 102 mmol/L (ref 96–106)
CO2: 23 mmol/L (ref 20–29)
Calcium: 9.1 mg/dL (ref 8.7–10.2)
Creatinine, Ser: 0.93 mg/dL (ref 0.76–1.27)
GFR calc Af Amer: 104 mL/min/{1.73_m2} (ref >59)
GFR calc non Af Amer: 90 mL/min/{1.73_m2} (ref >59)
GLUCOSE: 103 mg/dL — AB (ref 65–99)
POTASSIUM: 5.1 mmol/L (ref 3.5–5.2)
SODIUM: 140 mmol/L (ref 134–144)

## 2016-09-11 LAB — CBC
Hematocrit: 43.7 % (ref 37.5–51.0)
Hemoglobin: 14.8 g/dL (ref 13.0–17.7)
MCH: 29.4 pg (ref 26.6–33.0)
MCHC: 33.9 g/dL (ref 31.5–35.7)
MCV: 87 fL (ref 79–97)
PLATELETS: 181 10*3/uL (ref 150–379)
RBC: 5.03 x10E6/uL (ref 4.14–5.80)
RDW: 14.3 % (ref 12.3–15.4)
WBC: 10.1 10*3/uL (ref 3.4–10.8)

## 2016-09-11 LAB — PROTIME-INR
INR: 1.1 (ref 0.8–1.2)
PROTHROMBIN TIME: 11 s (ref 9.1–12.0)

## 2016-09-15 ENCOUNTER — Telehealth: Payer: Self-pay | Admitting: Cardiovascular Disease

## 2016-09-15 NOTE — Telephone Encounter (Signed)
Returned call to patient. Explained no documentation of who tried to reach patient. Patient scheduled for PV angiogram on 8/8 - explained that pre-cath labs were normal per [name].   Notes recorded by [name], MD on 09/14/2016 at 4:53 PM EDT Normal precath labs.

## 2016-09-15 NOTE — Telephone Encounter (Signed)
New message  Pt call requesting to speak with RN. Pt states he is returning a call. Please call back to discuss

## 2016-09-17 ENCOUNTER — Encounter (HOSPITAL_COMMUNITY)
Admission: RE | Disposition: A | Discharge status: Home / Self Care | Payer: Self-pay | Source: Ambulatory Visit | Attending: Cardiovascular Disease

## 2016-09-17 ENCOUNTER — Ambulatory Visit (HOSPITAL_COMMUNITY)
Admission: RE | Admit: 2016-09-17 | Discharge: 2016-09-17 | Disposition: A | Discharge status: Home / Self Care | Payer: Medicare Other | Source: Ambulatory Visit | Attending: Cardiovascular Disease | Admitting: Cardiovascular Disease

## 2016-09-17 DIAGNOSIS — Z95810 Presence of automatic (implantable) cardiac defibrillator: Secondary | ICD-10-CM | POA: Insufficient documentation

## 2016-09-17 DIAGNOSIS — G629 Polyneuropathy, unspecified: Secondary | ICD-10-CM | POA: Insufficient documentation

## 2016-09-17 DIAGNOSIS — K219 Gastro-esophageal reflux disease without esophagitis: Secondary | ICD-10-CM | POA: Diagnosis not present

## 2016-09-17 DIAGNOSIS — I70235 Atherosclerosis of native arteries of right leg with ulceration of other part of foot: Secondary | ICD-10-CM | POA: Insufficient documentation

## 2016-09-17 DIAGNOSIS — Z7982 Long term (current) use of aspirin: Secondary | ICD-10-CM | POA: Insufficient documentation

## 2016-09-17 DIAGNOSIS — F17210 Nicotine dependence, cigarettes, uncomplicated: Secondary | ICD-10-CM | POA: Diagnosis not present

## 2016-09-17 DIAGNOSIS — I2510 Atherosclerotic heart disease of native coronary artery without angina pectoris: Secondary | ICD-10-CM | POA: Insufficient documentation

## 2016-09-17 DIAGNOSIS — I10 Essential (primary) hypertension: Secondary | ICD-10-CM | POA: Diagnosis not present

## 2016-09-17 DIAGNOSIS — I255 Ischemic cardiomyopathy: Secondary | ICD-10-CM | POA: Diagnosis not present

## 2016-09-17 DIAGNOSIS — Z8673 Personal history of transient ischemic attack (TIA), and cerebral infarction without residual deficits: Secondary | ICD-10-CM | POA: Diagnosis not present

## 2016-09-17 DIAGNOSIS — Q211 Atrial septal defect: Secondary | ICD-10-CM | POA: Insufficient documentation

## 2016-09-17 DIAGNOSIS — I4891 Unspecified atrial fibrillation: Secondary | ICD-10-CM | POA: Diagnosis not present

## 2016-09-17 DIAGNOSIS — E785 Hyperlipidemia, unspecified: Secondary | ICD-10-CM | POA: Diagnosis not present

## 2016-09-17 DIAGNOSIS — L97511 Non-pressure chronic ulcer of other part of right foot limited to breakdown of skin: Secondary | ICD-10-CM | POA: Diagnosis not present

## 2016-09-17 DIAGNOSIS — I70223 Atherosclerosis of native arteries of extremities with rest pain, bilateral legs: Secondary | ICD-10-CM | POA: Diagnosis not present

## 2016-09-17 DIAGNOSIS — Z955 Presence of coronary angioplasty implant and graft: Secondary | ICD-10-CM | POA: Insufficient documentation

## 2016-09-17 DIAGNOSIS — I70222 Atherosclerosis of native arteries of extremities with rest pain, left leg: Secondary | ICD-10-CM | POA: Diagnosis not present

## 2016-09-17 DIAGNOSIS — G4733 Obstructive sleep apnea (adult) (pediatric): Secondary | ICD-10-CM | POA: Diagnosis not present

## 2016-09-17 DIAGNOSIS — I714 Abdominal aortic aneurysm, without rupture: Secondary | ICD-10-CM | POA: Insufficient documentation

## 2016-09-17 DIAGNOSIS — I252 Old myocardial infarction: Secondary | ICD-10-CM | POA: Diagnosis not present

## 2016-09-17 DIAGNOSIS — Z7901 Long term (current) use of anticoagulants: Secondary | ICD-10-CM | POA: Insufficient documentation

## 2016-09-17 DIAGNOSIS — I739 Peripheral vascular disease, unspecified: Secondary | ICD-10-CM

## 2016-09-17 HISTORY — PX: ABDOMINAL AORTOGRAM W/LOWER EXTREMITY: CATH118223

## 2016-09-17 SURGERY — ABDOMINAL AORTOGRAM W/LOWER EXTREMITY
Anesthesia: LOCAL

## 2016-09-17 MED ORDER — MIDAZOLAM HCL 2 MG/2ML IJ SOLN
INTRAMUSCULAR | Status: DC | PRN
  Administered 2016-09-17: 1 mg via INTRAVENOUS

## 2016-09-17 MED ORDER — LIDOCAINE HCL (PF) 1 % IJ SOLN
INTRAMUSCULAR | Status: DC | PRN
  Administered 2016-09-17: 30 mL via INTRADERMAL

## 2016-09-17 MED ORDER — SODIUM CHLORIDE 0.9% FLUSH: 3.0000 mL | Freq: Two times a day (BID) | INTRAVENOUS | Status: DC

## 2016-09-17 MED ORDER — ASPIRIN 81 MG PO CHEW
81.0000 mg | CHEWABLE_TABLET | ORAL | Status: AC
  Administered 2016-09-17: 81 mg via ORAL

## 2016-09-17 MED ORDER — FENTANYL CITRATE (PF) 100 MCG/2ML IJ SOLN
INTRAMUSCULAR | Status: DC | PRN
  Administered 2016-09-17: 50 ug via INTRAVENOUS

## 2016-09-17 MED ORDER — SODIUM CHLORIDE 0.9 % IV SOLN: 250.0000 mL | INTRAVENOUS | Status: DC | PRN

## 2016-09-17 MED ORDER — HEPARIN (PORCINE) IN NACL 2-0.9 UNIT/ML-% IJ SOLN
INTRAMUSCULAR | Status: AC
  Filled 2016-09-17: qty 500

## 2016-09-17 MED ORDER — SODIUM CHLORIDE 0.9% FLUSH: 3.0000 mL | INTRAVENOUS | Status: DC | PRN

## 2016-09-17 MED ORDER — ASPIRIN 81 MG PO CHEW
CHEWABLE_TABLET | ORAL | Status: AC
  Administered 2016-09-17: 81 mg via ORAL
  Filled 2016-09-17: qty 1

## 2016-09-17 MED ORDER — SODIUM CHLORIDE 0.9 % IV SOLN: INTRAVENOUS | Status: AC

## 2016-09-17 MED ORDER — LIDOCAINE HCL (PF) 1 % IJ SOLN
INTRAMUSCULAR | Status: AC
  Filled 2016-09-17: qty 30

## 2016-09-17 MED ORDER — VARENICLINE TARTRATE 0.5 MG X 11 & 1 MG X 42 PO MISC: ORAL | 0 refills | Status: DC

## 2016-09-17 MED ORDER — IODIXANOL 320 MG/ML IV SOLN
INTRAVENOUS | Status: DC | PRN
  Administered 2016-09-17: 117 mL via INTRA_ARTERIAL

## 2016-09-17 MED ORDER — SODIUM CHLORIDE 0.9 % IV SOLN
INTRAVENOUS | Status: DC
  Administered 2016-09-17: 08:00:00 via INTRAVENOUS

## 2016-09-17 MED ORDER — FENTANYL CITRATE (PF) 100 MCG/2ML IJ SOLN
INTRAMUSCULAR | Status: AC
  Filled 2016-09-17: qty 2

## 2016-09-17 MED ORDER — MIDAZOLAM HCL 2 MG/2ML IJ SOLN
INTRAMUSCULAR | Status: AC
  Filled 2016-09-17: qty 2

## 2016-09-17 MED ORDER — HEPARIN (PORCINE) IN NACL 2-0.9 UNIT/ML-% IJ SOLN
INTRAMUSCULAR | Status: AC | PRN
  Administered 2016-09-17: 1000 mL via INTRA_ARTERIAL

## 2016-09-17 SURGICAL SUPPLY — 8 items
CATH ANGIO 5F PIGTAIL 65CM (CATHETERS) ×1 IMPLANT
KIT PV (KITS) ×2 IMPLANT
SHEATH PINNACLE 5F 10CM (SHEATH) ×1 IMPLANT
SYRINGE MEDRAD AVANTA MACH 7 (SYRINGE) ×1 IMPLANT
TRANSDUCER W/STOPCOCK (MISCELLANEOUS) ×2 IMPLANT
TRAY PV CATH (CUSTOM PROCEDURE TRAY) ×2 IMPLANT
TUBING CIL FLEX 10 FLL-RA (TUBING) ×1 IMPLANT
WIRE HITORQ VERSACORE ST 145CM (WIRE) ×1 IMPLANT

## 2016-09-17 NOTE — Interval H&P Note (Signed)
History and Physical Interval Note:  09/17/2016 8:37 AM  [name]  has presented today for surgery, with the diagnosis of pvd  The various methods of treatment have been discussed with the patient and family. After consideration of risks, benefits and other options for treatment, the patient has consented to  Procedure(s): Abdominal Aortogram w/Lower Extremity (N/A) as a surgical intervention .  The patient's history has been reviewed, patient examined, no change in status, stable for surgery.  I have reviewed the patient's chart and labs.  Questions were answered to the patient's satisfaction.     Kathlyn Sacramento

## 2016-09-17 NOTE — Progress Notes (Addendum)
Site area: LFA Site Prior to Removal:  Level 0 Pressure Applied For:25 min Manual: yes   Patient Status During Pull:  stable Post Pull Site:  Level 0 Post Pull Instructions Given:  yes Post Pull Pulses Present: doppler Dressing Applied:  tegaderm Bedrest begins @ 1030 till 1430 Comments:

## 2016-09-17 NOTE — H&P (View-Only) (Signed)
Cardiology Office Note   Date:  09/02/2016   ID:  Johntae, Broxterman 01-05-1956, MRN [id number]  PCP:  [name], MD  Cardiologist:  [name]  No chief complaint on file.     History of Present Illness: ANTHONY TAMBURO is a 61 y.o. male who was referred by Dr. Paulla Dolly for evaluation of peripheral arterial disease. He has known history of coronary artery disease with previous inferior myocardial infarction with prior stent placement to the right coronary artery, ischemic cardiomyopathy status post ICD placement, previous CVA, hypertension, hyperlipidemia, obstructive sleep apnea, small size abdominal aortic aneurysm, recent atrial fibrillation and prolonged history of tobacco use. He was recently started on anticoagulation with Xarelto. He has been told about peripheral neuropathy for many years. He complains of severe bilateral calf pain with minimal walking worse on the right side. He also describes burning sensation in both feet at night with cramps at rest. Both feet are cold. He developed small callus on the bottom of the right foot few months ago and that has not healed. He underwent recent noninvasive vascular evaluation in our office. His ABI was critically low on the right side with no detectable flow in the digits. It was low on the left side. There was evidence of inflow disease worse on the right side with bilateral SFA occlusion. The left popliteal artery was occluded. 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    Past Medical History:  Diagnosis Date  . CAD (coronary artery disease)    status post diaphragmatic wall infarction in 1995, treated with percutaneous transluminal coronary  angioplasty with subsequent stenting of the right coronary artery in 2002.   [name] GERD (gastroesophageal reflux disease)   . Hyperlipidemia   . Hypertension    Intolerant to MULTIPLE CHOLESTEROL MEDICATIONS.  [name] PFO (patent  foramen ovale)   . Status post cholecystectomy     Past Surgical History:  Procedure Laterality Date  . ANKLE SURGERY    . APPENDECTOMY    . CARDIAC DEFIBRILLATOR PLACEMENT     [employer]  . CHOLECYSTECTOMY  12/02/02  . HERNIA REPAIR    . PTCA     stent of the RCA 07/24/00     Current Outpatient Prescriptions  Medication Sig Dispense Refill  . amitriptyline (ELAVIL) 25 MG tablet Take 25 mg by mouth at bedtime as needed for sleep.     [name] apixaban (ELIQUIS) 5 MG TABS tablet Take 1 tablet (5 mg total) by mouth 2 (two) times daily. 180 tablet 3  . aspirin EC 81 MG tablet Take 1 tablet (81 mg total) by mouth daily. 90 tablet 3  . benazepril (LOTENSIN) 10 MG tablet Take 1 tablet (10 mg total) by mouth daily. 30 tablet 6  . gabapentin (NEURONTIN) 600 MG tablet Take 1 tablet by mouth daily as needed (for pain).     . metoprolol succinate (TOPROL-XL) 50 MG 24 hr tablet Take 25 mg by mouth daily. Take with or immediately following a meal.    . nitroGLYCERIN (NITROSTAT) 0.4 MG SL tablet Place 1 tablet (0.4 mg total) under the tongue every 5 (five) minutes as needed for chest pain. May repeat 3 times. 25 tablet 3  . simvastatin (ZOCOR) 40 MG tablet Take 1 tablet (40 mg total) by mouth at bedtime. 30 tablet 6   No current facility-administered medications for this visit.     Allergies:   Patient has no  known allergies.    Social History:  The patient  reports that he has been smoking.  He has never used smokeless tobacco. He reports that he does not drink alcohol or use drugs.   Family History:  The patient's family history includes Coronary artery disease in his unknown relative; Dementia in his mother.    ROS:  Please see the history of present illness.   Otherwise, review of systems are positive for none.   All other systems are reviewed and negative.    PHYSICAL EXAM: VS:  There were no vitals taken for this visit. , BMI There is no height or weight on file to calculate BMI. GEN: Well  nourished, well developed, in no acute distress  HEENT: normal  Neck: no JVD, carotid bruits, or masses Cardiac: RRR; no murmurs, rubs, or gallops,no edema  Respiratory:  clear to auscultation bilaterally, normal work of breathing GI: soft, nontender, nondistended, + BS MS: no deformity or atrophy  Skin: warm and dry, no rash Neuro:  Strength and sensation are intact Psych: euthymic mood, full affect Vascular: Femoral pulses barely palpable on the right side and +2 on the left. Distal pulses are not palpable. He has dependent rubor on the right side. There is small ulceration at the bottom of the right metatarsal area.  EKG:  EKG is not ordered today.    Recent Labs: No results found for requested labs within last 8760 hours.    Lipid Panel    Component Value Date/Time   CHOL 148 02/17/2012 1032   TRIG 140.0 02/17/2012 1032   HDL 33.80 (L) 02/17/2012 1032   CHOLHDL 4 02/17/2012 1032   VLDL 28.0 02/17/2012 1032   LDLCALC 86 02/17/2012 1032      Wt Readings from Last 3 Encounters:  08/06/16 195 lb (88.5 kg)  07/22/16 193 lb (87.5 kg)  05/28/16 193 lb (87.5 kg)       No flowsheet data found.    ASSESSMENT AND PLAN:  1.  Peripheral arterial disease with critical limb ischemia affecting the right lower extremity. He has evidence of disease on both sides but worse on the right side likely due to worsening inflow disease on the right side as his right femoral pulse is barely palpable. I recommend proceeding with abdominal aortogram with lower extremity angiography and possible endovascular intervention. I discussed the procedure in details as well as risks and benefits.. Plan access is via the left common femoral artery. We might need to proceed with staged intervention on his iliac system first and see how he responds. SFA disease might require surgical revascularization given chronicity of his symptoms and occlusion. Hold Eliquis 2 days before the procedure. I added  aspirin 81 mg daily.  2. Tobacco use: I had a prolonged discussion with him about the importance of smoking cessation. I explained to him the high risk of progression to limb loss with continued tobacco use.  3. Hyperlipidemia: Continue treatment with simvastatin with a target LDL of less than 70.    Disposition:   FU with me in 1 month  Signed,  Kathlyn Sacramento, MD  09/02/2016 6:25 PM    [place]

## 2016-09-17 NOTE — Discharge Instructions (Signed)
Angiogram, Care After °This sheet gives you information about how to care for yourself after your procedure. Your health care provider may also give you more specific instructions. If you have problems or questions, contact your health care provider. °What can I expect after the procedure? °After the procedure, it is common to have bruising and tenderness at the catheter insertion area. °Follow these instructions at home: °Insertion site care  °· Follow instructions from your health care provider about how to take care of your insertion site. Make sure you: °¨ Wash your hands with soap and water before you change your bandage (dressing). If soap and water are not available, use hand sanitizer. °¨ Change your dressing as told by your health care provider. °¨ Leave stitches (sutures), skin glue, or adhesive strips in place. These skin closures may need to stay in place for 2 weeks or longer. If adhesive strip edges start to loosen and curl up, you may trim the loose edges. Do not remove adhesive strips completely unless your health care provider tells you to do that. °· Do not take baths, swim, or use a hot tub until your health care provider approves. °· You may shower 24-48 hours after the procedure or as told by your health care provider. °¨ Gently wash the site with plain soap and water. °¨ Pat the area dry with a clean towel. °¨ Do not rub the site. This may cause bleeding. °· Do not apply powder or lotion to the site. Keep the site clean and dry. °· Check your insertion site every day for signs of infection. Check for: °¨ Redness, swelling, or pain. °¨ Fluid or blood. °¨ Warmth. °¨ Pus or a bad smell. °Activity  °· Rest as told by your health care provider, usually for 1-2 days. °· Do not lift anything that is heavier than 10 lbs. (4.5 kg) or as told by your health care provider. °· Do not drive for 24 hours if you were given a medicine to help you relax (sedative). °· Do not drive or use heavy machinery while  taking prescription pain medicine. °General instructions  °· Return to your normal activities as told by your health care provider, usually in about a week. Ask your health care provider what activities are safe for you. °· If the catheter site starts bleeding, lie flat and put pressure on the site. If the bleeding does not stop, get help right away. This is a medical emergency. °· Drink enough fluid to keep your urine clear or pale yellow. This helps flush the contrast dye from your body. °· Take over-the-counter and prescription medicines only as told by your health care provider. °· Keep all follow-up visits as told by your health care provider. This is important. °Contact a health care provider if: °· You have a fever or chills. °· You have redness, swelling, or pain around your insertion site. °· You have fluid or blood coming from your insertion site. °· The insertion site feels warm to the touch. °· You have pus or a bad smell coming from your insertion site. °· You have bruising around the insertion site. °· You notice blood collecting in the tissue around the catheter site (hematoma). The hematoma may be painful to the touch. °Get help right away if: °· You have severe pain at the catheter insertion area. °· The catheter insertion area swells very fast. °· The catheter insertion area is bleeding, and the bleeding does not stop when you hold steady pressure on   the area. °· The area near or just beyond the catheter insertion site becomes pale, cool, tingly, or numb. °These symptoms may represent a serious problem that is an emergency. Do not wait to see if the symptoms will go away. Get medical help right away. Call your local emergency services (911 in the [place] Do not drive yourself to the hospital. °Summary °· After the procedure, it is common to have bruising and tenderness at the catheter insertion area. °· After the procedure, it is important to rest and drink plenty of fluids. °· Do not take baths,  swim, or use a hot tub until your health care provider says it is okay to do so. You may shower 24-48 hours after the procedure or as told by your health care provider. °· If the catheter site starts bleeding, lie flat and put pressure on the site. If the bleeding does not stop, get help right away. This is a medical emergency. °This information is not intended to replace advice given to you by your health care provider. Make sure you discuss any questions you have with your health care provider. °Document Released: 08/15/2004 Document Revised: 01/02/2016 Document Reviewed: 01/02/2016 °Elsevier Interactive Patient Education © 2017 [employer] ° °

## 2016-09-18 ENCOUNTER — Telehealth: Payer: Self-pay | Admitting: Cardiovascular Disease

## 2016-09-18 ENCOUNTER — Telehealth: Payer: Self-pay | Admitting: *Deleted

## 2016-09-18 ENCOUNTER — Encounter (HOSPITAL_COMMUNITY): Payer: Self-pay | Admitting: Cardiovascular Disease

## 2016-09-18 ENCOUNTER — Other Ambulatory Visit: Payer: Self-pay | Admitting: Cardiovascular Disease

## 2016-09-18 DIAGNOSIS — I714 Abdominal aortic aneurysm, without rupture, unspecified: Secondary | ICD-10-CM

## 2016-09-18 NOTE — Telephone Encounter (Signed)
-----   Message from Philbert Riser sent at 09/10/2016  8:19 AM EDT ----- Regarding: RE: Coumadin Clinic 09/10/16  See below. ----- Message ----- From: Dennie Fetters, LPN Sent: 06/03/5359  12:43 PM To: Dionicio Stall, RN, Deliah Boston Via, LPN, # Subject: Coumadin Clinic                                This pt needs to have a Coumadin visit 5 days after he starts taking Coumadin. He states that he has samples of Eliquis left that he wants to finish before starting Coumadin and he wants to check with his PCP to see if he can have his INRs checked there. Please call the pt next week (week of 09/15/16) to see if he needs a Coumadin visit here or if he has scheduled a Coumadin check at his PCPs office as his INRs must be checked by [place] or another MDs office. Thanks, Jeani Hawking If you have question please call me at [phone].

## 2016-09-18 NOTE — Telephone Encounter (Signed)
Telephoned pt and he states he will follow up with his PCP for his Coumadin management, Dr. Arelia Sneddon and does not need an appt here. He states he has 3weeks worth of Eliquis and doesn't want to switch over until he runs out. I did educate the pt that he would need to overlap Eliquis with Coumadin for 3 days, take 1 tablet of Coumadin with the PM dose of Eliquis, after the third day, discontinue the Eliquis and continue with the 1 tablet QD of Coumadin and you must have your INR checked on day 5. Pt repeated the instructions and verbalized understanding.

## 2016-09-18 NOTE — Telephone Encounter (Signed)
[name] is calling to find out if anything has been schedule for him for th CT scan and if there is anything else he needs to know . His Procedure didn't go as plan on yesterday .  Please call .

## 2016-09-19 ENCOUNTER — Other Ambulatory Visit: Payer: Self-pay | Admitting: Cardiovascular Disease

## 2016-09-19 ENCOUNTER — Telehealth (HOSPITAL_COMMUNITY): Payer: Self-pay

## 2016-09-19 ENCOUNTER — Other Ambulatory Visit: Payer: Self-pay | Admitting: *Deleted

## 2016-09-19 DIAGNOSIS — I714 Abdominal aortic aneurysm, without rupture, unspecified: Secondary | ICD-10-CM

## 2016-09-19 NOTE — Progress Notes (Signed)
Per [name], a CT angio of abdominal aorta with lower extremity has been ordered. The patient has been notified.

## 2016-09-19 NOTE — Telephone Encounter (Signed)
Returned the call to the patient. Per [name] note,   This patient had an angiogram today and needs evaluation for aortobifemoral bypass. Schedule him for CT angio of abdominal aorta with lower extremity arterial run off. Also needs a Lexicographer for preop evaluation. These should be done ASAP.  Refer him to Dr. Donnetta Hutching at [facility] to be seen within 1 week.   The orders have been placed. The patient has been notified that someone from scheduling would be calling to make the appointments.

## 2016-09-19 NOTE — Telephone Encounter (Signed)
Encounter complete. 

## 2016-09-22 ENCOUNTER — Ambulatory Visit (INDEPENDENT_AMBULATORY_CARE_PROVIDER_SITE_OTHER)
Admission: RE | Admit: 2016-09-22 | Discharge: 2016-09-22 | Disposition: A | Discharge status: Home / Self Care | Payer: Medicare Other | Source: Ambulatory Visit | Attending: Cardiovascular Disease | Admitting: Cardiovascular Disease

## 2016-09-22 DIAGNOSIS — I714 Abdominal aortic aneurysm, without rupture, unspecified: Secondary | ICD-10-CM

## 2016-09-22 MED ORDER — IOPAMIDOL (ISOVUE-370) INJECTION 76%: 125.0000 mL | Freq: Once | INTRAVENOUS | Status: DC | PRN

## 2016-09-24 ENCOUNTER — Ambulatory Visit (HOSPITAL_COMMUNITY)
Admission: RE | Admit: 2016-09-24 | Discharge: 2016-09-24 | Disposition: A | Discharge status: Home / Self Care | Payer: Medicare Other | Source: Ambulatory Visit | Attending: Cardiovascular Disease | Admitting: Cardiovascular Disease

## 2016-09-24 DIAGNOSIS — I714 Abdominal aortic aneurysm, without rupture, unspecified: Secondary | ICD-10-CM

## 2016-09-24 MED ORDER — TECHNETIUM TC 99M TETROFOSMIN IV KIT
10.9000 | PACK | Freq: Once | INTRAVENOUS | Status: AC | PRN
  Administered 2016-09-24: 10.9 via INTRAVENOUS
  Filled 2016-09-24: qty 11

## 2016-09-24 MED ORDER — TECHNETIUM TC 99M TETROFOSMIN IV KIT
30.6000 | PACK | Freq: Once | INTRAVENOUS | Status: AC | PRN
  Administered 2016-09-24: 30.6 via INTRAVENOUS
  Filled 2016-09-24: qty 31

## 2016-09-24 MED ORDER — REGADENOSON 0.4 MG/5ML IV SOLN
0.4000 mg | Freq: Once | INTRAVENOUS | Status: AC
  Administered 2016-09-24: 0.4 mg via INTRAVENOUS

## 2016-09-25 ENCOUNTER — Telehealth: Payer: Self-pay | Admitting: Cardiovascular Disease

## 2016-09-25 LAB — MYOCARDIAL PERFUSION IMAGING
CHL CUP NUCLEAR SSS: 12
CHL CUP RESTING HR STRESS: 53 {beats}/min
LVDIAVOL: 197 mL (ref 62–150)
LVSYSVOL: 132 mL
NUC STRESS TID: 1.16
Peak HR: 74 {beats}/min
SDS: 2
SRS: 10

## 2016-09-25 NOTE — Telephone Encounter (Signed)
-----   Message from Blenda Bridegroom sent at 09/25/2016  8:45 AM EDT ----- Regarding: High Risk Nuclear Study High Risk Nuclear study read by Dr Sallyanne Kuster on 09/25/16. This test was ordered by Dr Kathlyn Sacramento.

## 2016-09-25 NOTE — Telephone Encounter (Signed)
Reviewed message sent to triage by nuclear department regarding high risk nuclear stress test with Dr. Sallyanne Kuster, reading MD - no need for urgent follow up, test "high risk" as EF is under 35%. Test was ordered by [name] for pre-op clearance for procedure to be scheduled with [facility]. Test to be reviewed by [name] and patient will be contacted with MD's recommendations accordingly.

## 2016-09-26 ENCOUNTER — Telehealth: Payer: Self-pay | Admitting: Cardiovascular Disease

## 2016-09-26 NOTE — Telephone Encounter (Signed)
Mr.Keating is calling to speak about Dr. Donnetta Hutching.  Please call

## 2016-09-26 NOTE — Telephone Encounter (Signed)
Returned the call to the patient to inform him of his results and referral.   Notes recorded by [name], MD on 09/25/2016 at 4:25 PM EDT Inform patient that stress test was abnormal but not different from prior cardiac testing. Given his low EF, he is at moderate to high risk for aortobifemoral bypass. I'm going to forward this to Dr. Donnetta Hutching for whom I referred the patient to. Endovascular repair of his aortic aneurysm and iliac disease is preferable over surgical repair. Given the ulceration on the right foot, we should expedite his appointment with Dr. Donnetta Hutching.   The patient verbalized his understanding. A call has been placed to Dr. Luther Parody office to expedite an appointment.

## 2016-09-26 NOTE — Telephone Encounter (Signed)
New message    Pt is calling asking about the results from his testing. Please call.

## 2016-09-26 NOTE — Telephone Encounter (Signed)
Spoke with pt he states he spoke with Dr Luther Parody office, they made his appointment for 11-11-16. Is this soon enough? Please advise.

## 2016-09-29 NOTE — Telephone Encounter (Signed)
The patient has ulceration on right feet. He should be seen urgently within 1 week.

## 2016-09-29 NOTE — Telephone Encounter (Signed)
(  [phone]- DR EARLY'S OFFICE  CLOSED OFFICE OPENS 9AM

## 2016-09-29 NOTE — Telephone Encounter (Signed)
S/W SCHEDULER ALLISON SHE STATES THAT SHE CANNOT SCHEDULE ANY SOONER THE APPT THAT IS SCHEDULED-MD WILL HAVE TO CALL AND SPEAK WITH DR EARLY FOR SOONER APPT-PT IS ON WAIT LIST

## 2016-09-30 NOTE — Telephone Encounter (Signed)
Dr Fletcher Anon aware he will need to call and talk with dr early.

## 2016-09-30 NOTE — Telephone Encounter (Signed)
Returned call to patient he stated he was calling to find out if Dr.Arida has spoke to [facility] about a sooner appointment.Stated he has appointment with Dr.Early 11/11/16.He wants to be seen sooner due to having a lot of pain in legs.Message sent to [place].

## 2016-09-30 NOTE — Telephone Encounter (Signed)
F/u Message  Pt call requesting to speak with RN to f/u with [name]. Please call back to discuss

## 2016-10-01 NOTE — Telephone Encounter (Signed)
Will do today

## 2016-10-02 ENCOUNTER — Telehealth: Payer: Self-pay

## 2016-10-02 NOTE — Telephone Encounter (Signed)
I spoke with Dr. Donzetta Matters and he will see him in office next week.

## 2016-10-02 NOTE — Telephone Encounter (Signed)
Spoke with patient regarding an earlier appointment date/time. Patient was assured that we would speak directly with Dr. Donzetta Matters and call him back.

## 2016-10-03 NOTE — Telephone Encounter (Signed)
Follow up scheduled with [facility]

## 2016-10-06 ENCOUNTER — Ambulatory Visit (HOSPITAL_BASED_OUTPATIENT_CLINIC_OR_DEPARTMENT_OTHER): Payer: Medicare Other | Attending: Cardiovascular Disease | Admitting: Cardiovascular Disease

## 2016-10-06 VITALS — Ht 73.0 in | Wt 190.0 lb

## 2016-10-06 DIAGNOSIS — Z79899 Other long term (current) drug therapy: Secondary | ICD-10-CM | POA: Diagnosis not present

## 2016-10-06 DIAGNOSIS — G4761 Periodic limb movement disorder: Secondary | ICD-10-CM | POA: Diagnosis not present

## 2016-10-06 DIAGNOSIS — R0683 Snoring: Secondary | ICD-10-CM | POA: Insufficient documentation

## 2016-10-06 DIAGNOSIS — Z7901 Long term (current) use of anticoagulants: Secondary | ICD-10-CM | POA: Insufficient documentation

## 2016-10-06 DIAGNOSIS — I493 Ventricular premature depolarization: Secondary | ICD-10-CM | POA: Diagnosis not present

## 2016-10-06 DIAGNOSIS — G4733 Obstructive sleep apnea (adult) (pediatric): Secondary | ICD-10-CM | POA: Diagnosis not present

## 2016-10-06 DIAGNOSIS — G4730 Sleep apnea, unspecified: Secondary | ICD-10-CM | POA: Diagnosis present

## 2016-10-08 ENCOUNTER — Encounter: Payer: Self-pay | Admitting: Vascular Surgery

## 2016-10-10 ENCOUNTER — Ambulatory Visit (INDEPENDENT_AMBULATORY_CARE_PROVIDER_SITE_OTHER): Payer: Medicare Other | Admitting: Vascular Surgery

## 2016-10-10 ENCOUNTER — Encounter: Payer: Self-pay | Admitting: Vascular Surgery

## 2016-10-10 VITALS — BP 139/85 | HR 55 | Temp 98.2°F | Resp 20 | Ht 73.0 in | Wt 185.0 lb

## 2016-10-10 DIAGNOSIS — I714 Abdominal aortic aneurysm, without rupture, unspecified: Secondary | ICD-10-CM

## 2016-10-10 DIAGNOSIS — I739 Peripheral vascular disease, unspecified: Secondary | ICD-10-CM

## 2016-10-10 NOTE — Progress Notes (Signed)
Patient ID: [name], male   DOB: 08-Aug-1955, 61 y.o.   MRN: [id number]  Reason for Consult: New Patient (Initial Visit) (eval AAA - ref by [name])   Referred by [name], *  Subjective:     HPI:  [name] is a 61 y.o. male with history of coronary artery disease as well as stroke. He also has a history of atrial fibrillation and now takes Eliquis although he is planning to switch to Coumadin in the near future due to cost. He also has a history of abdominal aortic aneurysm as well as a significant family history having family members who died from aneurysmal rupture. He was most recently evaluated by [name] for peripheral arterial disease. He has a right-sided first metatarsal head ulceration that has callus. There is no erythema and he has not had any erythema to suggest any infection. This is averse ulceration he has had of his legs. He does have significant rest pain in his right greater than left lower extremity as well. Along these lines he also has very short distance claudication and requires riding a cart through the grocery store because he cannot walk more than approximately 50 feet without calf cramping. He is not having any chest pain at this time. Recent CT scan performed and has been reviewed along with [name] prior to this visit.  Past Medical History:  Diagnosis Date  . AAA (abdominal aortic aneurysm) ([place])   . CAD (coronary artery disease)    status post diaphragmatic wall infarction in 1995, treated with percutaneous transluminal coronary  angioplasty with subsequent stenting of the right coronary artery in 2002.   [name] GERD (gastroesophageal reflux disease)   . Hyperlipidemia   . Hypertension    Intolerant to MULTIPLE CHOLESTEROL MEDICATIONS.  [name] PFO (patent foramen ovale)   . Status post cholecystectomy    Family History  Problem Relation Age of Onset  . Coronary artery disease Unknown   . Dementia Mother        on hospice as of  05/12/2016  . Heart disease Father    Past Surgical History:  Procedure Laterality Date  . ABDOMINAL AORTOGRAM W/LOWER EXTREMITY N/A 09/17/2016   Procedure: Abdominal Aortogram w/Lower Extremity;  Surgeon: [name], MD;  Location: [facility];  Service: Cardiovascular;  Laterality: N/A;  . ANKLE SURGERY    . APPENDECTOMY    . CARDIAC DEFIBRILLATOR PLACEMENT     St. Jude  . CHOLECYSTECTOMY  12/02/02  . HERNIA REPAIR    . PTCA     stent of the RCA 07/24/00    Short Social History:  Social History  Substance Use Topics  . Smoking status: Smoker, Current Status Unknown    Packs/day: 0.50    Years: 40.00  . Smokeless tobacco: Never Used  . Alcohol use No    No Known Allergies  Current Outpatient Prescriptions  Medication Sig Dispense Refill  . amitriptyline (ELAVIL) 25 MG tablet Take 25 mg by mouth at bedtime as needed for sleep.     [name] apixaban (ELIQUIS) 5 MG TABS tablet Take 5 mg by mouth 2 (two) times daily.    [name] aspirin EC 81 MG tablet Take 1 tablet (81 mg total) by mouth daily. (Patient taking differently: Take 81 mg by mouth every evening. ) 90 tablet 3  . benazepril (LOTENSIN) 10 MG tablet Take 1 tablet (10 mg total) by mouth daily. (Patient taking differently: Take 10 mg by mouth  every evening. ) 30 tablet 6  . gabapentin (NEURONTIN) 600 MG tablet Take 600 mg by mouth daily as needed (for pain).     [name] HYDROcodone-acetaminophen (NORCO/VICODIN) 5-325 MG tablet Take 0.5-1 tablets by mouth 2 (two) times daily as needed. For pain.    . magic mouthwash SOLN Take 5 mLs by mouth.    . metoprolol succinate (TOPROL-XL) 50 MG 24 hr tablet Take 25 mg by mouth every evening. Take with or immediately following a meal.     . nitroGLYCERIN (NITROSTAT) 0.4 MG SL tablet Place 1 tablet (0.4 mg total) under the tongue every 5 (five) minutes as needed for chest pain. May repeat 3 times. 25 tablet 3  . simvastatin (ZOCOR) 40 MG tablet Take 1 tablet (40 mg total) by mouth at bedtime. 30  tablet 6  . varenicline (CHANTIX STARTING MONTH PAK) 0.5 MG X 11 & 1 MG X 42 tablet Take one 0.5 mg tablet by mouth once daily for 3 days, then increase to one 0.5 mg tablet twice daily for 4 days, then increase to one 1 mg tablet twice daily. 53 tablet 0   No current facility-administered medications for this visit.     Review of Systems  Constitutional:  Constitutional negative. HENT: HENT negative.  Eyes: Eyes negative.  Respiratory: Respiratory negative.  Cardiovascular: Positive for claudication and leg swelling.  GI: Gastrointestinal negative.  Musculoskeletal: Positive for joint pain.  Skin: Positive for wound.  Neurological: Neurological negative. Hematologic: Hematologic/lymphatic negative.  Psychiatric: Psychiatric negative.        Objective:  Objective   Vitals:   10/10/16 0836  BP: 139/85  Pulse: (!) 55  Resp: 20  Temp: 98.2 F (36.8 C)  TempSrc: Oral  SpO2: 100%  Weight: 185 lb (83.9 kg)  Height: 6\' 1"  (1.854 m)   Body mass index is 24.41 kg/m.  Physical Exam  Constitutional: He is oriented to person, place, and time. He appears well-developed.  HENT:  Head: Normocephalic.  Neck: Normal range of motion.  Cardiovascular: Normal rate.   Pulses:      Radial pulses are 2+ on the right side, and 2+ on the left side.       Femoral pulses are 0 on the right side, and 1+ on the left side. Pulmonary/Chest: Effort normal.  Abdominal: Soft. He exhibits mass.  Musculoskeletal: Normal range of motion. He exhibits no edema.  Lymphadenopathy:    He has no cervical adenopathy.  Neurological: He is alert and oriented to person, place, and time.  Skin: Skin is warm and dry.  1cm ulceration with callous right 1st mtp  Psychiatric: He has a normal mood and affect. His behavior is normal. Judgment and thought content normal.    Data: IMPRESSION: VASCULAR  1. Abdominal aortic aneurysm measuring 4.1 x 4.4 cm in greatest diameter at the level of the distal aorta.  There is component of mild dilatation of the proximal abdominal aorta and juxtarenal abdominal aorta. 2. There are branches show roughly 60- 75% stenosis at the origin of the celiac axis, 50- 60% stenosis at the origin of the SMA, greater than 70% stenosis at the origin of the superior and dominant right renal artery and 50- 60% stenosis at the origin of the left renal artery. 3. 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 4. 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      Assessment/Plan:     61 year old male with bilateral lower extremity pain and ulcer on the right plantar foot all other is callus there. He has rest pain and very short distance claudication of the right. He also has an associated 4.2 cm abdominal aortic aneurysm. He has an occluded right external iliac artery stenosis on the left side which would make any endovascular repair to include his aortic aneurysm very difficult. From the standpoint of discussed with him proceeding with aortobifemoral bypass. We have discussed risks benefits and alternatives including endovascular repair with possible later repair of his aneurysm. We have also discussed that he may need further work on his right lower extremity either endovascularly or  open if his wound does not heal. We discussed the risk of hernia along with the risk of stroke myocardial infarction and death as well as local wound complications. I expect he will stay in the hospital for at least 7 days and given his long-term smoking status may require prolonged intubation. Also discussed that he may discharge to rehabilitation facility prior to going home. He damages very good understanding and will get him scheduled in the next few weeks. He will need to hold eliquis for 48 hours prior to procedure.     [name] MD [facility]

## 2016-10-15 ENCOUNTER — Other Ambulatory Visit: Payer: Self-pay

## 2016-10-17 ENCOUNTER — Telehealth: Payer: Self-pay | Admitting: *Deleted

## 2016-10-17 NOTE — Procedures (Signed)
Patient Name: [name], [name] Date: 10/06/2016 Gender: Male D.O.B: 27-Jun-1955 Age (years): 36 Referring Provider: Shelva Majestic MD, ABSM Height (inches): 73 Interpreting Physician: Shelva Majestic MD, ABSM Weight (lbs): 190 RPSGT: Madelon Lips BMI: 25 MRN: [id number] Neck Size: 16.00  CLINICAL INFORMATION The patient is referred for a CPAP titration to treat sleep apnea.  Date of NPSG: 07/22/2016: AHI 28.6/h; supine AHI 35.4/h; significant oxygen desaturation to 73%.  SLEEP STUDY TECHNIQUE As per the AASM Manual for the Scoring of Sleep and Associated Events v2.3 (April 2016) with a hypopnea requiring 4% desaturations.  The channels recorded and monitored were frontal, central and occipital EEG, electrooculogram (EOG), submentalis EMG (chin), nasal and oral airflow, thoracic and abdominal wall motion, anterior tibialis EMG, snore microphone, electrocardiogram, and pulse oximetry. Continuous positive airway pressure (CPAP) was initiated at the beginning of the study and titrated to treat sleep-disordered breathing.  MEDICATIONS * amitriptyline (ELAVIL) 25 MG tablet Take 25 mg by mouth at bedtime as needed for sleep.   * benazepril (LOTENSIN) 10 MG tablet Take 1 tablet (10 mg total) by mouth daily.  * gabapentin (NEURONTIN) 600 MG tablet Take 1 tablet by mouth daily as needed (for pain).   * metoprolol succinate (TOPROL-XL) 50 MG 24 hr tablet Take 25 mg by mouth daily. Take with or immediately following a meal.   * nitroGLYCERIN (NITROSTAT) 0.4 MG SL tablet Place 1 tablet (0.4 mg total) under the tongue every 5 (five) minutes as needed for chest pain. May repeat 3 times. * simvastatin (ZOCOR) 40 MG tablet Take 1 tablet (40 mg total) by mouth at bedtime.  * apixaban (ELIQUIS) 5 MG TABS tablet Take 1 tablet (5 mg total) by mouth 2 (two) times daily.    Medications self-administered by patient taken the night of the study : N/A  TECHNICIAN COMMENTS Comments added by  technician: BATHROOM BREAK 1X. PATIENT REPORTED TAKING NIGHT MEDICATION AT 2130   RESPIRATORY PARAMETERS Optimal PAP Pressure (cm): 10 AHI at Optimal Pressure (/hr): 4.0 Overall Minimal O2 (%): 90.00 Supine % at Optimal Pressure (%): 33 Minimal O2 at Optimal Pressure (%): 92.0    SLEEP ARCHITECTURE The study was initiated at 10:48:47 PM and ended at 4:53:25 AM.  Sleep onset time was 11.4 minutes and the sleep efficiency was 64.6%. The total sleep time was 235.5 minutes.  The patient spent 12.95% of the night in stage N1 sleep, 70.06% in stage N2 sleep, 0.00% in stage N3 and 16.99% in REM.Stage REM latency was 109.5 minutes  Wake after sleep onset was 117.7. Alpha intrusion was absent. Supine sleep was 51.80%.  CARDIAC DATA The 2 lead EKG demonstrated sinus rhythm. The mean heart rate was 60.16 beats per minute. Other EKG findings include: PVCs.  LEG MOVEMENT DATA The total Periodic Limb Movements of Sleep (PLMS) were 212. The PLMS index was 54.01. A PLMS index of <15 is considered normal in adults.  IMPRESSIONS - CPAP was initiated at 5 cm and was titrated up to10 cm of water.  AHI at 10 cm was 4.0/h and there were 3 central events at this presure.  The AHI at 8 cm water pressure was 20.3/h. - Central sleep apnea was not noted during this titration (CAI = 1.5/h). - No significant oxygen desaturations rubs or of with a nadir of 90% at 8 cm water pressure. - The patient snored with Loud snoring volume during this titration study. - 2-lead EKG demonstrated: PVCs - Severe periodic limb movements were observed during this  study. Arousals associated with PLMs were rare.  DIAGNOSIS - Obstructive Sleep Apnea (327.23 [G47.33 ICD-10]) - Periodic limb movement disorder of sleep  RECOMMENDATIONS - Recommend an initial  trial of CPAP therapy with EPR of 3 at 10 cm H2O with heated humidification.  A medium size Resmed Full Face Mask AirFit F20 mask was used for the titration study. - Effort  should be made to optimize nasal and oral pharyngeal patency. - If patient is symptomatic with restless legs on CPAP therapy, consider a trial of pharmacotherapy. - Avoid alcohol, sedatives and other CNS depressants that may worsen sleep apnea and disrupt normal sleep architecture. - Sleep hygiene should be reviewed to assess factors that may improve sleep quality. - Weight management and regular exercise should be initiated or continued. - Recommend a download be obtained in 30 days and follow up sleep clinic evaluation after 4 weeks of therapy. -  [Electronically signed] 10/17/2016 08:14 AM  Shelva Majestic MD, [facility], [place], American Board of Sleep Medicine   NPI: [id number] [place] PH: [phone]   FX: [phone] [place]

## 2016-10-17 NOTE — Telephone Encounter (Signed)
CPAP orders faxed to [facility].  Patient aware and verbalized understanding.   Aware to call back if he does not hear from [facility] within 2 weeks.

## 2016-10-17 NOTE — Pre-Procedure Instructions (Signed)
[name]  10/17/2016      [facility] - [place], [place] - [address] [address] [place] [address] Phone: [phone] Fax: [phone]    Your procedure is scheduled on Thursday, October 23, 2016  Report to [facility] Admitting Entrance "A" at 5:30 A.M.  Call this number if you have problems the morning of surgery:  [phone]   Remember:  Do not eat food or drink liquids after midnight on October 22, 2016  Follow your doctor's instructions on taking Aspirin and Eliquis, unless indicated also:  Take these medicines the morning of surgery with A SIP OF WATER: If needed Acetaminophen-codeine (TYLENOL #3) for pain.  As of today, stop taking all Aspirins, Vitamins, Fish oils, and Herbal medications. Also stop all NSAIDS i.e. Advil, Motrin, Aleve, Anaprox, Naproxen, BC and Goody Powders.   Do not wear jewelry.  Do not wear lotions, powders, or perfumes, or deodorant.  Do not shave 48 hours prior to surgery.  Men may shave face and neck.  Do not bring valuables to the hospital.  [facility] is not responsible for any belongings or valuables.  Contacts, dentures or bridgework may not be worn into surgery.  Leave your suitcase in the car.  After surgery it may be brought to your room.  For patients admitted to the hospital, discharge time will be determined by your treatment team.  Patients discharged the day of surgery will not be allowed to drive home.   Special instructions:   [place]- Preparing For Surgery  Before surgery, you can play an important role. Because skin is not sterile, your skin needs to be as free of germs as possible. You can reduce the number of germs on your skin by washing with CHG (chlorahexidine gluconate) Soap before surgery.  CHG is an antiseptic cleaner which kills germs and bonds with the skin to continue killing germs even after washing.  Please do not use if you have  an allergy to CHG or antibacterial soaps. If your skin becomes reddened/irritated stop using the CHG.  Do not shave (including legs and underarms) for at least 48 hours prior to first CHG shower. It is OK to shave your face.  Please follow these instructions carefully.   1. Shower the NIGHT BEFORE SURGERY and the MORNING OF SURGERY with CHG.   2. If you chose to wash your hair, wash your hair first as usual with your normal shampoo.  3. After you shampoo, rinse your hair and body thoroughly to remove the shampoo.  4. Use CHG as you would any other liquid soap. You can apply CHG directly to the skin and wash gently with a scrungie or a clean washcloth.   5. Apply the CHG Soap to your body ONLY FROM THE NECK DOWN.  Do not use on open wounds or open sores. Avoid contact with your eyes, ears, mouth and genitals (private parts). Wash genitals (private parts) with your normal soap.  6. Wash thoroughly, paying special attention to the area where your surgery will be performed.  7. Thoroughly rinse your body with warm water from the neck down.  8. DO NOT shower/wash with your normal soap after using and rinsing off the CHG Soap.  9. Pat yourself dry with a CLEAN TOWEL.   10. Wear CLEAN PAJAMAS   11. Place CLEAN SHEETS on your bed the night of your first shower and DO NOT SLEEP WITH PETS.  Day of Surgery: Do not apply any deodorants/lotions. Please wear clean clothes to the hospital/surgery center.    Please read over the following fact sheets that you were given. Pain Booklet, Coughing and Deep Breathing, Blood Transfusion Information, MRSA Information and Surgical Site Infection Prevention

## 2016-10-17 NOTE — Telephone Encounter (Signed)
-----   Message from Troy Sine, MD sent at 10/17/2016  8:20 AM EDT ----- Angie Fava: Please set up DME company to initiate CPAP therapy.  Arrange download in 30 days and sleep clinic follow-up evaluation

## 2016-10-20 ENCOUNTER — Telehealth: Payer: Self-pay

## 2016-10-20 ENCOUNTER — Encounter (HOSPITAL_COMMUNITY)
Admission: RE | Admit: 2016-10-20 | Discharge: 2016-10-20 | Disposition: A | Discharge status: Home / Self Care | Payer: Medicare Other | Source: Ambulatory Visit | Attending: Vascular Surgery | Admitting: Vascular Surgery

## 2016-10-20 ENCOUNTER — Encounter (HOSPITAL_COMMUNITY): Payer: Self-pay

## 2016-10-20 DIAGNOSIS — I255 Ischemic cardiomyopathy: Secondary | ICD-10-CM

## 2016-10-20 DIAGNOSIS — Z8673 Personal history of transient ischemic attack (TIA), and cerebral infarction without residual deficits: Secondary | ICD-10-CM | POA: Insufficient documentation

## 2016-10-20 DIAGNOSIS — I2510 Atherosclerotic heart disease of native coronary artery without angina pectoris: Secondary | ICD-10-CM | POA: Insufficient documentation

## 2016-10-20 DIAGNOSIS — E785 Hyperlipidemia, unspecified: Secondary | ICD-10-CM

## 2016-10-20 DIAGNOSIS — I714 Abdominal aortic aneurysm, without rupture: Secondary | ICD-10-CM

## 2016-10-20 DIAGNOSIS — Z95810 Presence of automatic (implantable) cardiac defibrillator: Secondary | ICD-10-CM

## 2016-10-20 DIAGNOSIS — F17200 Nicotine dependence, unspecified, uncomplicated: Secondary | ICD-10-CM | POA: Insufficient documentation

## 2016-10-20 DIAGNOSIS — G4733 Obstructive sleep apnea (adult) (pediatric): Secondary | ICD-10-CM

## 2016-10-20 DIAGNOSIS — K219 Gastro-esophageal reflux disease without esophagitis: Secondary | ICD-10-CM | POA: Insufficient documentation

## 2016-10-20 DIAGNOSIS — I110 Hypertensive heart disease with heart failure: Secondary | ICD-10-CM

## 2016-10-20 DIAGNOSIS — Z01812 Encounter for preprocedural laboratory examination: Secondary | ICD-10-CM

## 2016-10-20 DIAGNOSIS — I509 Heart failure, unspecified: Secondary | ICD-10-CM

## 2016-10-20 DIAGNOSIS — Z7901 Long term (current) use of anticoagulants: Secondary | ICD-10-CM

## 2016-10-20 DIAGNOSIS — Z79899 Other long term (current) drug therapy: Secondary | ICD-10-CM

## 2016-10-20 HISTORY — DX: Acute myocardial infarction, unspecified: I21.9

## 2016-10-20 HISTORY — DX: Presence of automatic (implantable) cardiac defibrillator: Z95.810

## 2016-10-20 HISTORY — DX: Myoneural disorder, unspecified: G70.9

## 2016-10-20 HISTORY — DX: Sleep apnea, unspecified: G47.30

## 2016-10-20 HISTORY — DX: Heart failure, unspecified: I50.9

## 2016-10-20 HISTORY — DX: Pneumonia, unspecified organism: J18.9

## 2016-10-20 HISTORY — DX: Cerebral infarction, unspecified: I63.9

## 2016-10-20 HISTORY — DX: Presence of cardiac pacemaker: Z95.0

## 2016-10-20 LAB — COMPREHENSIVE METABOLIC PANEL
ALBUMIN: 3.7 g/dL (ref 3.5–5.0)
ALT: 14 U/L — ABNORMAL LOW (ref 17–63)
ANION GAP: 9 (ref 5–15)
AST: 17 U/L (ref 15–41)
Alkaline Phosphatase: 53 U/L (ref 38–126)
BUN: 14 mg/dL (ref 6–20)
CO2: 21 mmol/L — AB (ref 22–32)
Calcium: 8.9 mg/dL (ref 8.9–10.3)
Chloride: 105 mmol/L (ref 101–111)
Creatinine, Ser: 0.95 mg/dL (ref 0.61–1.24)
GFR calc non Af Amer: >60 mL/min (ref >60)
GLUCOSE: 143 mg/dL — AB (ref 65–99)
POTASSIUM: 3.9 mmol/L (ref 3.5–5.1)
SODIUM: 135 mmol/L (ref 135–145)
Total Bilirubin: 0.9 mg/dL (ref 0.3–1.2)
Total Protein: 6.2 g/dL — ABNORMAL LOW (ref 6.5–8.1)

## 2016-10-20 LAB — BLOOD GAS, ARTERIAL
ACID-BASE DEFICIT: 0.7 mmol/L (ref 0.0–2.0)
BICARBONATE: 22.6 mmol/L (ref 20.0–28.0)
Drawn by: 421801
FIO2: 21
O2 SAT: 97.9 %
PATIENT TEMPERATURE: 98.6
PO2 ART: 105 mmHg (ref 83.0–108.0)
pCO2 arterial: 32.1 mmHg (ref 32.0–48.0)
pH, Arterial: 7.462 — ABNORMAL HIGH (ref 7.350–7.450)

## 2016-10-20 LAB — CBC
HCT: 42.1 % (ref 39.0–52.0)
Hemoglobin: 14.2 g/dL (ref 13.0–17.0)
MCH: 29 pg (ref 26.0–34.0)
MCHC: 33.7 g/dL (ref 30.0–36.0)
MCV: 85.9 fL (ref 78.0–100.0)
PLATELETS: 175 10*3/uL (ref 150–400)
RBC: 4.9 MIL/uL (ref 4.22–5.81)
RDW: 13.4 % (ref 11.5–15.5)
WBC: 10.2 10*3/uL (ref 4.0–10.5)

## 2016-10-20 LAB — URINALYSIS, ROUTINE W REFLEX MICROSCOPIC
BILIRUBIN URINE: NEGATIVE
Glucose, UA: NEGATIVE mg/dL
HGB URINE DIPSTICK: NEGATIVE
KETONES UR: NEGATIVE mg/dL
Leukocytes, UA: NEGATIVE
Nitrite: NEGATIVE
PH: 5 (ref 5.0–8.0)
Protein, ur: NEGATIVE mg/dL
SPECIFIC GRAVITY, URINE: 1.025 (ref 1.005–1.030)

## 2016-10-20 LAB — ABO/RH: ABO/RH(D): O POS

## 2016-10-20 LAB — SURGICAL PCR SCREEN
MRSA, PCR: NEGATIVE
STAPHYLOCOCCUS AUREUS: NEGATIVE

## 2016-10-20 LAB — PREPARE RBC (CROSSMATCH)

## 2016-10-20 NOTE — Telephone Encounter (Signed)
-----   Message from Evans Lance, MD sent at 10/17/2016  9:04 PM EDT ----- Regarding: RE: Holding Eliquis pre-operatively Ok to hold Eliquis for 48 hours. [place] for surgery. GT ----- Message ----- From: Denman George, RN Sent: 10/15/2016   1:10 PM To: Dionicio Stall, RN, Evans Lance, MD Subject: Holding Eliquis pre-operatively                This pt. is scheduled for an Aortobifemoral Bypass Graft on 10/23/16 per Dr. Servando Snare.  He has been instructed to hold Eliquis 48 hrs. prior to surgery; last dose to be taken 10/20/16.  I have sent a medication clearance form to your office today.  I wanted to make you aware of this.

## 2016-10-20 NOTE — Progress Notes (Signed)
PCP - Dr. Claris Gower  Cardiologist - [name]  PM/ICD- Dr. Cristopher Peru  Chest x-ray - Denies  EKG - 05/12/16 (E)  Stress Test - 09/24/16 (E)  ECHO - 10/18/09 (E)  Cardiac Cath - 20 yrs Stent x1  PM/ICD- St. Jude x5 years  Sleep Study - Yes CPAP - Yes- Does not know the settings. Pt asked to bring the mask and tubing the day of surgery.  Chart will be given for anesthesia review due to cardiac history and PM/ICD device. PTT/PT will be checked the day of surgery, pt to take last dose of Eliquis on 10/20/16. Pt takes 5mg  BID.  Pt denies having chest pain, sob, or fever at this time. All instructions explained to the pt, with a verbal understanding of the material. Pt agrees to go over the instructions while at home for a better understanding. The opportunity to ask questions was provided.

## 2016-10-21 ENCOUNTER — Encounter (HOSPITAL_COMMUNITY): Payer: Self-pay

## 2016-10-21 NOTE — Progress Notes (Signed)
error 

## 2016-10-21 NOTE — Progress Notes (Addendum)
Anesthesia Chart Review:    Pt is a 61 year old male scheduled for aorta bifemoral bypass graft on 10/23/2016 with Servando Snare, MD  - PCP is Claris Gower, MD - Cardiologist is Kirk Ruths, MD. Last office visit 05/12/16 with Almyra Deforest, PA for SVT vs new onset afib.  - EP cardiologist is Cristopher Peru, MD who is aware of upcoming surgery.  - PV cardiologist is Kathlyn Sacramento, MD who referred pt for surgery.   PMH includes:  CAD (PTCA 1995; BMS to RCA 2002), CHF, ischemic cardiomyopathy, AICD (St. Jude inserted 04/01/10), PFO, HTN, AAA, hyperlipidemia, OSA, stroke, GERD. Current smoker. BMI 24.  Medications include: Eliquis, benazepril, metoprolol, simvastatin. Patient to hold Eliquis 48 hours before surgery.  Preoperative labs reviewed.  PT/PTT will be obtained DOS  EKG 05/12/16: NSR. Cannot rule out inferior infarct, age undetermined.   Nuclear stress test 09/25/16:   The left ventricular ejection fraction is moderately decreased (30-44%).  Nuclear stress EF: 33%.  Defect 1: There is a medium defect of severe severity present in the basal inferior, mid inferior and apical inferior location.  This is a high risk study.  Findings consistent with prior myocardial infarction. - High risk nuclear study due to a severe inferior scar and moderate to severe reduction in overall left ventricular function. - There is minimal peri-infarct reversible ischemia. There are no large areas of ischemia  AAA duplex 07/25/16:  - Technically challenging study. - Stable infrarenal fusiform AAA, measuring 4.0 cm x 3.9 cm in the mid aorta. - Normal caliber common and external iliac arteries, bilaterally. - Aorto-iliac atherosclerosis. - Normal right common and external iliac arteries. - Progression on left common iliac artery disease with higher velocities compared to prior exam, now >50% stenosis. - >50% left external iliac artery stenosis. - Patent IVC  Cardiac cath 02/12/10:  1. LM: widely patent.  Distal LM with 25% stenosis.  2. LAD: heavy calcification in proximal and mid portions. Mid LAD 50-60% stenosis. Diagonal branch with ostial 60-70% stenosis. Otherwise nonobstructive stenosis throughout LAD and diagonal branches.  3. CX: very small in the AV groove. Intermediate branch is fairly large vessel. Ostial 50% stenosis of CX/intermediate. No other significant disease in CX distribution.  4. RCA: Heavy calcification throughout RCA. Patent stent in mid vessel with 30-40% in-stent restenosis. Scattered plaque throughout entirety of RCA. Proximal and distal vessel both with 50% stenosis. PDA and posterolateral branches both patent  TEE 10/18/09:  - Left ventricle: Systolic function was moderately to severely reduced. The estimated ejection fraction was in the range of 30% to 35%. Diffuse hypokinesis. - Left atrium: The atrium was mildly dilated. No evidence of thrombus in the atrial cavity or appendage. - Atrial septum: Agitated saline contrast study showed a shunt (transthoracic imaging), There was an atrial septal aneurysm.  Perioperative prescription form for AICD pending.   If labs acceptable DOS, I anticipate pt can proceed as scheduled.   Willeen Cass, FNP-BC [facility] Stay [facility]/Anesthesiology Phone: [phone] 10/21/2016 11:47 AM

## 2016-10-22 ENCOUNTER — Encounter (HOSPITAL_COMMUNITY): Payer: Self-pay | Admitting: Certified Registered Nurse Anesthetist

## 2016-10-22 ENCOUNTER — Telehealth: Payer: Self-pay | Admitting: Internal Medicine

## 2016-10-22 NOTE — Telephone Encounter (Signed)
Informed Sharyn Lull that I just faxed over device instructions for surgery

## 2016-10-22 NOTE — Telephone Encounter (Signed)
New message      Calling to confirm we received form for device instructions because pt is having surgery tomorrow

## 2016-10-23 ENCOUNTER — Encounter (HOSPITAL_COMMUNITY)
Admission: RE | Disposition: A | Discharge status: Home Health | Payer: Self-pay | Source: Ambulatory Visit | Attending: Vascular Surgery

## 2016-10-23 ENCOUNTER — Inpatient Hospital Stay (HOSPITAL_COMMUNITY)
Admission: RE | Admit: 2016-10-23 | Discharge: 2016-11-02 | DRG: 271 | Disposition: A | Discharge status: Home Health | Payer: Medicare Other | Source: Ambulatory Visit | Attending: Vascular Surgery | Admitting: Vascular Surgery

## 2016-10-23 ENCOUNTER — Inpatient Hospital Stay (HOSPITAL_COMMUNITY): Payer: Medicare Other

## 2016-10-23 ENCOUNTER — Inpatient Hospital Stay (HOSPITAL_COMMUNITY): Payer: Medicare Other | Admitting: Emergency Medicine

## 2016-10-23 ENCOUNTER — Other Ambulatory Visit: Payer: Self-pay

## 2016-10-23 ENCOUNTER — Encounter (HOSPITAL_COMMUNITY): Payer: Self-pay

## 2016-10-23 ENCOUNTER — Inpatient Hospital Stay (HOSPITAL_COMMUNITY): Payer: Medicare Other | Admitting: Certified Registered Nurse Anesthetist

## 2016-10-23 DIAGNOSIS — F17210 Nicotine dependence, cigarettes, uncomplicated: Secondary | ICD-10-CM | POA: Diagnosis present

## 2016-10-23 DIAGNOSIS — E876 Hypokalemia: Secondary | ICD-10-CM | POA: Diagnosis not present

## 2016-10-23 DIAGNOSIS — I714 Abdominal aortic aneurysm, without rupture: Principal | ICD-10-CM | POA: Diagnosis present

## 2016-10-23 DIAGNOSIS — Q211 Atrial septal defect: Secondary | ICD-10-CM | POA: Diagnosis not present

## 2016-10-23 DIAGNOSIS — G4733 Obstructive sleep apnea (adult) (pediatric): Secondary | ICD-10-CM | POA: Diagnosis present

## 2016-10-23 DIAGNOSIS — I4891 Unspecified atrial fibrillation: Secondary | ICD-10-CM | POA: Diagnosis not present

## 2016-10-23 DIAGNOSIS — Z8673 Personal history of transient ischemic attack (TIA), and cerebral infarction without residual deficits: Secondary | ICD-10-CM

## 2016-10-23 DIAGNOSIS — I7092 Chronic total occlusion of artery of the extremities: Secondary | ICD-10-CM | POA: Diagnosis present

## 2016-10-23 DIAGNOSIS — R600 Localized edema: Secondary | ICD-10-CM | POA: Diagnosis not present

## 2016-10-23 DIAGNOSIS — R140 Abdominal distension (gaseous): Secondary | ICD-10-CM

## 2016-10-23 DIAGNOSIS — E785 Hyperlipidemia, unspecified: Secondary | ICD-10-CM | POA: Diagnosis present

## 2016-10-23 DIAGNOSIS — I480 Paroxysmal atrial fibrillation: Secondary | ICD-10-CM | POA: Diagnosis present

## 2016-10-23 DIAGNOSIS — I739 Peripheral vascular disease, unspecified: Secondary | ICD-10-CM | POA: Diagnosis present

## 2016-10-23 DIAGNOSIS — I252 Old myocardial infarction: Secondary | ICD-10-CM

## 2016-10-23 DIAGNOSIS — L97519 Non-pressure chronic ulcer of other part of right foot with unspecified severity: Secondary | ICD-10-CM | POA: Diagnosis present

## 2016-10-23 DIAGNOSIS — Z95810 Presence of automatic (implantable) cardiac defibrillator: Secondary | ICD-10-CM | POA: Diagnosis not present

## 2016-10-23 DIAGNOSIS — I2510 Atherosclerotic heart disease of native coronary artery without angina pectoris: Secondary | ICD-10-CM | POA: Diagnosis present

## 2016-10-23 DIAGNOSIS — I509 Heart failure, unspecified: Secondary | ICD-10-CM | POA: Diagnosis present

## 2016-10-23 DIAGNOSIS — I255 Ischemic cardiomyopathy: Secondary | ICD-10-CM | POA: Diagnosis present

## 2016-10-23 DIAGNOSIS — K146 Glossodynia: Secondary | ICD-10-CM | POA: Diagnosis not present

## 2016-10-23 DIAGNOSIS — Z95828 Presence of other vascular implants and grafts: Secondary | ICD-10-CM

## 2016-10-23 DIAGNOSIS — I110 Hypertensive heart disease with heart failure: Secondary | ICD-10-CM | POA: Diagnosis present

## 2016-10-23 DIAGNOSIS — Z9861 Coronary angioplasty status: Secondary | ICD-10-CM | POA: Diagnosis not present

## 2016-10-23 DIAGNOSIS — I745 Embolism and thrombosis of iliac artery: Secondary | ICD-10-CM | POA: Diagnosis present

## 2016-10-23 DIAGNOSIS — Z0189 Encounter for other specified special examinations: Secondary | ICD-10-CM

## 2016-10-23 DIAGNOSIS — I7409 Other arterial embolism and thrombosis of abdominal aorta: Secondary | ICD-10-CM | POA: Diagnosis present

## 2016-10-23 DIAGNOSIS — K219 Gastro-esophageal reflux disease without esophagitis: Secondary | ICD-10-CM | POA: Diagnosis present

## 2016-10-23 HISTORY — PX: AORTA - BILATERAL FEMORAL ARTERY BYPASS GRAFT: SHX1175

## 2016-10-23 LAB — BASIC METABOLIC PANEL
ANION GAP: 8 (ref 5–15)
BUN: 13 mg/dL (ref 6–20)
CALCIUM: 7.6 mg/dL — AB (ref 8.9–10.3)
CO2: 21 mmol/L — AB (ref 22–32)
CREATININE: 0.99 mg/dL (ref 0.61–1.24)
Chloride: 107 mmol/L (ref 101–111)
Glucose, Bld: 182 mg/dL — ABNORMAL HIGH (ref 65–99)
Potassium: 4 mmol/L (ref 3.5–5.1)
SODIUM: 136 mmol/L (ref 135–145)

## 2016-10-23 LAB — CBC
HEMATOCRIT: 35.4 % — AB (ref 39.0–52.0)
Hemoglobin: 11.9 g/dL — ABNORMAL LOW (ref 13.0–17.0)
MCH: 29.2 pg (ref 26.0–34.0)
MCHC: 33.6 g/dL (ref 30.0–36.0)
MCV: 87 fL (ref 78.0–100.0)
PLATELETS: 152 10*3/uL (ref 150–400)
RBC: 4.07 MIL/uL — ABNORMAL LOW (ref 4.22–5.81)
RDW: 13.8 % (ref 11.5–15.5)
WBC: 17.2 10*3/uL — AB (ref 4.0–10.5)

## 2016-10-23 LAB — POCT I-STAT 7, (LYTES, BLD GAS, ICA,H+H)
ACID-BASE DEFICIT: 5 mmol/L — AB (ref 0.0–2.0)
Acid-base deficit: 2 mmol/L (ref 0.0–2.0)
Acid-base deficit: 4 mmol/L — ABNORMAL HIGH (ref 0.0–2.0)
BICARBONATE: 21.7 mmol/L (ref 20.0–28.0)
BICARBONATE: 22.6 mmol/L (ref 20.0–28.0)
Bicarbonate: 23.8 mmol/L (ref 20.0–28.0)
Bicarbonate: 25.1 mmol/L (ref 20.0–28.0)
CALCIUM ION: 1.15 mmol/L (ref 1.15–1.40)
CALCIUM ION: 1.05 mmol/L — AB (ref 1.15–1.40)
Calcium, Ion: 1.12 mmol/L — ABNORMAL LOW (ref 1.15–1.40)
Calcium, Ion: 1.13 mmol/L — ABNORMAL LOW (ref 1.15–1.40)
HCT: 33 % — ABNORMAL LOW (ref 39.0–52.0)
HCT: 30 % — ABNORMAL LOW (ref 39.0–52.0)
HCT: 31 % — ABNORMAL LOW (ref 39.0–52.0)
HEMATOCRIT: 30 % — AB (ref 39.0–52.0)
HEMOGLOBIN: 11.2 g/dL — AB (ref 13.0–17.0)
HEMOGLOBIN: 10.5 g/dL — AB (ref 13.0–17.0)
HEMOGLOBIN: 10.2 g/dL — AB (ref 13.0–17.0)
Hemoglobin: 10.2 g/dL — ABNORMAL LOW (ref 13.0–17.0)
O2 SAT: 100 %
O2 SAT: 99 %
O2 SAT: 100 %
O2 Saturation: 99 %
PCO2 ART: 42.8 mmHg (ref 32.0–48.0)
PCO2 ART: 40.1 mmHg (ref 32.0–48.0)
PH ART: 7.378 (ref 7.350–7.450)
PH ART: 7.309 — AB (ref 7.350–7.450)
PH ART: 7.404 (ref 7.350–7.450)
PO2 ART: 137 mmHg — AB (ref 83.0–108.0)
PO2 ART: 178 mmHg — AB (ref 83.0–108.0)
POTASSIUM: 4 mmol/L (ref 3.5–5.1)
POTASSIUM: 4.2 mmol/L (ref 3.5–5.1)
Patient temperature: 35.6
Patient temperature: 36.4
Patient temperature: 37
Potassium: 4 mmol/L (ref 3.5–5.1)
Potassium: 4.4 mmol/L (ref 3.5–5.1)
SODIUM: 139 mmol/L (ref 135–145)
SODIUM: 138 mmol/L (ref 135–145)
Sodium: 139 mmol/L (ref 135–145)
Sodium: 139 mmol/L (ref 135–145)
TCO2: 25 mmol/L (ref 22–32)
TCO2: 23 mmol/L (ref 22–32)
TCO2: 24 mmol/L (ref 22–32)
TCO2: 26 mmol/L (ref 22–32)
pCO2 arterial: 39.8 mmHg (ref 32.0–48.0)
pCO2 arterial: 46 mmHg (ref 32.0–48.0)
pH, Arterial: 7.298 — ABNORMAL LOW (ref 7.350–7.450)
pO2, Arterial: 216 mmHg — ABNORMAL HIGH (ref 83.0–108.0)
pO2, Arterial: 191 mmHg — ABNORMAL HIGH (ref 83.0–108.0)

## 2016-10-23 LAB — PROTIME-INR
INR: 1
INR: 1.18
PROTHROMBIN TIME: 13.1 s (ref 11.4–15.2)
PROTHROMBIN TIME: 15 s (ref 11.4–15.2)

## 2016-10-23 LAB — BLOOD GAS, ARTERIAL
ACID-BASE DEFICIT: 1.5 mmol/L (ref 0.0–2.0)
BICARBONATE: 22.8 mmol/L (ref 20.0–28.0)
O2 Content: 8 L/min
O2 SAT: 99.3 %
PATIENT TEMPERATURE: 97.6
PCO2 ART: 37.9 mmHg (ref 32.0–48.0)
PO2 ART: 176 mmHg — AB (ref 83.0–108.0)
pH, Arterial: 7.394 (ref 7.350–7.450)

## 2016-10-23 LAB — MAGNESIUM: Magnesium: 1.6 mg/dL — ABNORMAL LOW (ref 1.7–2.4)

## 2016-10-23 LAB — PREPARE RBC (CROSSMATCH)

## 2016-10-23 LAB — APTT
APTT: 29 s (ref 24–36)
APTT: 30 s (ref 24–36)

## 2016-10-23 SURGERY — CREATION, BYPASS, ARTERIAL, AORTA TO FEMORAL, BILATERAL, USING GRAFT
Anesthesia: General | Site: Abdomen | Laterality: Bilateral

## 2016-10-23 MED ORDER — NALOXONE HCL 0.4 MG/ML IJ SOLN: 0.4000 mg | INTRAMUSCULAR | Status: DC | PRN

## 2016-10-23 MED ORDER — PHENYLEPHRINE 40 MCG/ML (10ML) SYRINGE FOR IV PUSH (FOR BLOOD PRESSURE SUPPORT)
PREFILLED_SYRINGE | INTRAVENOUS | Status: AC
  Filled 2016-10-23: qty 30

## 2016-10-23 MED ORDER — METOPROLOL TARTRATE 5 MG/5ML IV SOLN
2.5000 mg | Freq: Four times a day (QID) | INTRAVENOUS | Status: DC
  Administered 2016-10-23 – 2016-10-30 (×24): 2.5 mg via INTRAVENOUS
  Filled 2016-10-23 (×21): qty 5

## 2016-10-23 MED ORDER — PHENOL 1.4 % MT LIQD: 1.0000 | OROMUCOSAL | Status: DC | PRN

## 2016-10-23 MED ORDER — DIPHENHYDRAMINE HCL 12.5 MG/5ML PO ELIX
12.5000 mg | ORAL_SOLUTION | Freq: Four times a day (QID) | ORAL | Status: DC | PRN
  Filled 2016-10-23: qty 5

## 2016-10-23 MED ORDER — ESMOLOL HCL 100 MG/10ML IV SOLN
INTRAVENOUS | Status: DC | PRN
  Administered 2016-10-23: 10 mg via INTRAVENOUS

## 2016-10-23 MED ORDER — MORPHINE SULFATE (PF) 4 MG/ML IV SOLN
2.0000 mg | INTRAVENOUS | Status: DC | PRN
  Administered 2016-10-23 – 2016-10-30 (×4): 2 mg via INTRAVENOUS
  Administered 2016-10-30 – 2016-10-31 (×3): 3 mg via INTRAVENOUS
  Administered 2016-10-31 – 2016-11-01 (×3): 2 mg via INTRAVENOUS
  Administered 2016-11-01 – 2016-11-02 (×2): 3 mg via INTRAVENOUS
  Filled 2016-10-23 (×13): qty 1

## 2016-10-23 MED ORDER — SUGAMMADEX SODIUM 200 MG/2ML IV SOLN
INTRAVENOUS | Status: DC | PRN
  Administered 2016-10-23: 170 mg via INTRAVENOUS

## 2016-10-23 MED ORDER — HEPARIN SODIUM (PORCINE) 1000 UNIT/ML IJ SOLN
INTRAMUSCULAR | Status: DC | PRN
  Administered 2016-10-23: 5000 [IU] via INTRAVENOUS
  Administered 2016-10-23: 9000 [IU] via INTRAVENOUS

## 2016-10-23 MED ORDER — LIDOCAINE 2% (20 MG/ML) 5 ML SYRINGE
INTRAMUSCULAR | Status: AC
  Filled 2016-10-23: qty 5

## 2016-10-23 MED ORDER — ONDANSETRON HCL 4 MG/2ML IJ SOLN
INTRAMUSCULAR | Status: AC
  Filled 2016-10-23: qty 2

## 2016-10-23 MED ORDER — HYDRALAZINE HCL 20 MG/ML IJ SOLN: 5.0000 mg | INTRAMUSCULAR | Status: DC | PRN

## 2016-10-23 MED ORDER — PROPOFOL 10 MG/ML IV BOLUS
INTRAVENOUS | Status: DC | PRN
  Administered 2016-10-23: 50 mg via INTRAVENOUS
  Administered 2016-10-23 (×2): 20 mg via INTRAVENOUS
  Administered 2016-10-23: 60 mg via INTRAVENOUS
  Administered 2016-10-23: 20 mg via INTRAVENOUS
  Administered 2016-10-23: 30 mg via INTRAVENOUS

## 2016-10-23 MED ORDER — ROCURONIUM BROMIDE 10 MG/ML (PF) SYRINGE
PREFILLED_SYRINGE | INTRAVENOUS | Status: AC
  Filled 2016-10-23: qty 5

## 2016-10-23 MED ORDER — PROTAMINE SULFATE 10 MG/ML IV SOLN
INTRAVENOUS | Status: DC | PRN
  Administered 2016-10-23: 10 mg via INTRAVENOUS
  Administered 2016-10-23: 30 mg via INTRAVENOUS
  Administered 2016-10-23: 10 mg via INTRAVENOUS

## 2016-10-23 MED ORDER — MIDAZOLAM HCL 2 MG/2ML IJ SOLN
INTRAMUSCULAR | Status: AC
  Filled 2016-10-23: qty 2

## 2016-10-23 MED ORDER — DEXTROSE 5 % IV SOLN
1.5000 g | Freq: Once | INTRAVENOUS | Status: DC
  Filled 2016-10-23: qty 1.5

## 2016-10-23 MED ORDER — ACETAMINOPHEN 325 MG RE SUPP
325.0000 mg | RECTAL | Status: DC | PRN
  Filled 2016-10-23: qty 2

## 2016-10-23 MED ORDER — POTASSIUM CHLORIDE CRYS ER 20 MEQ PO TBCR: 20.0000 meq | EXTENDED_RELEASE_TABLET | Freq: Once | ORAL | Status: DC | PRN

## 2016-10-23 MED ORDER — LACTATED RINGERS IV SOLN
INTRAVENOUS | Status: DC | PRN
  Administered 2016-10-23: 09:00:00 via INTRAVENOUS

## 2016-10-23 MED ORDER — PROPOFOL 10 MG/ML IV BOLUS
INTRAVENOUS | Status: AC
  Filled 2016-10-23: qty 20

## 2016-10-23 MED ORDER — SODIUM BICARBONATE 8.4 % IV SOLN
INTRAVENOUS | Status: AC
  Filled 2016-10-23: qty 50

## 2016-10-23 MED ORDER — OXYCODONE HCL 5 MG/5ML PO SOLN: 5.0000 mg | Freq: Once | ORAL | Status: DC | PRN

## 2016-10-23 MED ORDER — DIPHENHYDRAMINE HCL 50 MG/ML IJ SOLN
12.5000 mg | Freq: Four times a day (QID) | INTRAMUSCULAR | Status: DC | PRN
Start: 2016-10-23 — End: 2016-10-30
  Administered 2016-10-27 – 2016-10-29 (×2): 12.5 mg via INTRAVENOUS
  Filled 2016-10-23 (×2): qty 1

## 2016-10-23 MED ORDER — LABETALOL HCL 5 MG/ML IV SOLN
10.0000 mg | INTRAVENOUS | Status: DC | PRN
  Administered 2016-10-23: 10 mg via INTRAVENOUS
  Filled 2016-10-23: qty 4

## 2016-10-23 MED ORDER — SODIUM CHLORIDE 0.9 % IV SOLN
INTRAVENOUS | Status: DC | PRN
  Administered 2016-10-23: 12:00:00 via INTRAVENOUS

## 2016-10-23 MED ORDER — HEMOSTATIC AGENTS (NO CHARGE) OPTIME
TOPICAL | Status: DC | PRN
  Administered 2016-10-23: 1 via TOPICAL

## 2016-10-23 MED ORDER — ARTIFICIAL TEARS OPHTHALMIC OINT
TOPICAL_OINTMENT | OPHTHALMIC | Status: AC
  Filled 2016-10-23: qty 3.5

## 2016-10-23 MED ORDER — SODIUM CHLORIDE 0.9 % IV SOLN: INTRAVENOUS | Status: DC

## 2016-10-23 MED ORDER — SODIUM CHLORIDE 0.9 % IV SOLN: Freq: Once | INTRAVENOUS | Status: DC

## 2016-10-23 MED ORDER — PHENYLEPHRINE HCL 10 MG/ML IJ SOLN
INTRAMUSCULAR | Status: DC | PRN
  Administered 2016-10-23: 80 ug via INTRAVENOUS
  Administered 2016-10-23 (×5): 40 ug via INTRAVENOUS
  Administered 2016-10-23 (×4): 80 ug via INTRAVENOUS
  Administered 2016-10-23 (×2): 40 ug via INTRAVENOUS
  Administered 2016-10-23 (×6): 80 ug via INTRAVENOUS
  Administered 2016-10-23 (×2): 40 ug via INTRAVENOUS
  Administered 2016-10-23: 80 ug via INTRAVENOUS
  Administered 2016-10-23: 40 ug via INTRAVENOUS
  Administered 2016-10-23 (×4): 80 ug via INTRAVENOUS

## 2016-10-23 MED ORDER — SODIUM BICARBONATE 4.2 % IV SOLN
INTRAVENOUS | Status: DC | PRN
  Administered 2016-10-23: 50 meq via INTRAVENOUS

## 2016-10-23 MED ORDER — FENTANYL CITRATE (PF) 100 MCG/2ML IJ SOLN
INTRAMUSCULAR | Status: DC | PRN
  Administered 2016-10-23: 75 ug via INTRAVENOUS
  Administered 2016-10-23: 25 ug via INTRAVENOUS
  Administered 2016-10-23: 50 ug via INTRAVENOUS
  Administered 2016-10-23 (×2): 100 ug via INTRAVENOUS
  Administered 2016-10-23: 50 ug via INTRAVENOUS

## 2016-10-23 MED ORDER — DEXTROSE 5 % IV SOLN
1.5000 g | INTRAVENOUS | Status: AC
  Administered 2016-10-23 (×2): 1.5 g via INTRAVENOUS
  Filled 2016-10-23: qty 1.5

## 2016-10-23 MED ORDER — ROCURONIUM BROMIDE 100 MG/10ML IV SOLN
INTRAVENOUS | Status: DC | PRN
  Administered 2016-10-23: 70 mg via INTRAVENOUS
  Administered 2016-10-23 (×2): 20 mg via INTRAVENOUS
  Administered 2016-10-23 (×3): 30 mg via INTRAVENOUS
  Administered 2016-10-23: 20 mg via INTRAVENOUS

## 2016-10-23 MED ORDER — BISACODYL 10 MG RE SUPP
10.0000 mg | Freq: Every day | RECTAL | Status: DC | PRN
  Filled 2016-10-23: qty 1

## 2016-10-23 MED ORDER — LACTATED RINGERS IV SOLN
INTRAVENOUS | Status: DC | PRN
  Administered 2016-10-23 (×2): via INTRAVENOUS

## 2016-10-23 MED ORDER — DEXAMETHASONE SODIUM PHOSPHATE 10 MG/ML IJ SOLN
INTRAMUSCULAR | Status: DC | PRN
  Administered 2016-10-23: 10 mg via INTRAVENOUS

## 2016-10-23 MED ORDER — CHLORHEXIDINE GLUCONATE 4 % EX LIQD: 60.0000 mL | Freq: Once | CUTANEOUS | Status: DC

## 2016-10-23 MED ORDER — 0.9 % SODIUM CHLORIDE (POUR BTL) OPTIME
TOPICAL | Status: DC | PRN
  Administered 2016-10-23 (×4): 1000 mL

## 2016-10-23 MED ORDER — PANTOPRAZOLE SODIUM 40 MG PO TBEC
40.0000 mg | DELAYED_RELEASE_TABLET | Freq: Every day | ORAL | Status: DC
  Administered 2016-10-29 – 2016-11-01 (×4): 40 mg via ORAL
  Filled 2016-10-23 (×6): qty 1

## 2016-10-23 MED ORDER — LACTATED RINGERS IV SOLN
INTRAVENOUS | Status: DC | PRN
  Administered 2016-10-23: 07:00:00 via INTRAVENOUS

## 2016-10-23 MED ORDER — SUGAMMADEX SODIUM 200 MG/2ML IV SOLN
INTRAVENOUS | Status: AC
  Filled 2016-10-23: qty 2

## 2016-10-23 MED ORDER — SODIUM CHLORIDE 0.9 % IV SOLN: 500.0000 mL | Freq: Once | INTRAVENOUS | Status: DC | PRN

## 2016-10-23 MED ORDER — ROCURONIUM BROMIDE 10 MG/ML (PF) SYRINGE
PREFILLED_SYRINGE | INTRAVENOUS | Status: AC
  Filled 2016-10-23: qty 10

## 2016-10-23 MED ORDER — ONDANSETRON HCL 4 MG/2ML IJ SOLN
INTRAMUSCULAR | Status: DC | PRN
  Administered 2016-10-23: 4 mg via INTRAVENOUS

## 2016-10-23 MED ORDER — LIDOCAINE 2% (20 MG/ML) 5 ML SYRINGE
INTRAMUSCULAR | Status: DC | PRN
  Administered 2016-10-23: 80 mg via INTRAVENOUS

## 2016-10-23 MED ORDER — SODIUM CHLORIDE 0.9 % IV SOLN
INTRAVENOUS | Status: DC | PRN
  Administered 2016-10-23: 09:00:00

## 2016-10-23 MED ORDER — DEXTROSE-NACL 5-0.45 % IV SOLN
INTRAVENOUS | Status: DC
  Administered 2016-10-23 – 2016-10-24 (×2): via INTRAVENOUS
  Administered 2016-10-24: 1000 mL via INTRAVENOUS
  Administered 2016-10-26 – 2016-10-27 (×3): via INTRAVENOUS

## 2016-10-23 MED ORDER — ALBUMIN HUMAN 5 % IV SOLN
INTRAVENOUS | Status: DC | PRN
  Administered 2016-10-23 (×2): via INTRAVENOUS

## 2016-10-23 MED ORDER — GUAIFENESIN-DM 100-10 MG/5ML PO SYRP: 15.0000 mL | ORAL_SOLUTION | ORAL | Status: DC | PRN

## 2016-10-23 MED ORDER — PHENYLEPHRINE 40 MCG/ML (10ML) SYRINGE FOR IV PUSH (FOR BLOOD PRESSURE SUPPORT)
PREFILLED_SYRINGE | INTRAVENOUS | Status: AC
  Filled 2016-10-23: qty 10

## 2016-10-23 MED ORDER — EPHEDRINE 5 MG/ML INJ
INTRAVENOUS | Status: AC
  Filled 2016-10-23: qty 10

## 2016-10-23 MED ORDER — MIDAZOLAM HCL 2 MG/2ML IJ SOLN
INTRAMUSCULAR | Status: DC | PRN
  Administered 2016-10-23 (×2): 1 mg via INTRAVENOUS

## 2016-10-23 MED ORDER — DOCUSATE SODIUM 100 MG PO CAPS
100.0000 mg | ORAL_CAPSULE | Freq: Every day | ORAL | Status: DC
  Administered 2016-10-29 – 2016-11-01 (×4): 100 mg via ORAL
  Filled 2016-10-23 (×6): qty 1

## 2016-10-23 MED ORDER — FENTANYL CITRATE (PF) 250 MCG/5ML IJ SOLN
INTRAMUSCULAR | Status: AC
  Filled 2016-10-23: qty 5

## 2016-10-23 MED ORDER — NITROGLYCERIN 0.4 MG SL SUBL: 0.4000 mg | SUBLINGUAL_TABLET | SUBLINGUAL | Status: DC | PRN

## 2016-10-23 MED ORDER — ONDANSETRON HCL 4 MG/2ML IJ SOLN: 4.0000 mg | Freq: Four times a day (QID) | INTRAMUSCULAR | Status: DC | PRN

## 2016-10-23 MED ORDER — PANTOPRAZOLE SODIUM 40 MG IV SOLR
40.0000 mg | Freq: Every day | INTRAVENOUS | Status: DC
  Administered 2016-10-23 – 2016-10-29 (×7): 40 mg via INTRAVENOUS
  Filled 2016-10-23 (×7): qty 40

## 2016-10-23 MED ORDER — EPHEDRINE SULFATE-NACL 50-0.9 MG/10ML-% IV SOSY
PREFILLED_SYRINGE | INTRAVENOUS | Status: DC | PRN
  Administered 2016-10-23: 5 mg via INTRAVENOUS

## 2016-10-23 MED ORDER — PROMETHAZINE HCL 25 MG/ML IJ SOLN: 6.2500 mg | INTRAMUSCULAR | Status: DC | PRN

## 2016-10-23 MED ORDER — DEXTROSE 5 % IV SOLN
INTRAVENOUS | Status: DC | PRN
  Administered 2016-10-23: 40 ug/min via INTRAVENOUS

## 2016-10-23 MED ORDER — DEXAMETHASONE SODIUM PHOSPHATE 10 MG/ML IJ SOLN
INTRAMUSCULAR | Status: AC
  Filled 2016-10-23: qty 1

## 2016-10-23 MED ORDER — OXYCODONE HCL 5 MG PO TABS: 5.0000 mg | ORAL_TABLET | Freq: Once | ORAL | Status: DC | PRN

## 2016-10-23 MED ORDER — MAGNESIUM SULFATE 2 GM/50ML IV SOLN
2.0000 g | Freq: Once | INTRAVENOUS | Status: AC | PRN
  Administered 2016-10-23: 2 g via INTRAVENOUS
  Filled 2016-10-23: qty 50

## 2016-10-23 MED ORDER — METOPROLOL TARTRATE 5 MG/5ML IV SOLN
2.0000 mg | INTRAVENOUS | Status: DC | PRN
  Administered 2016-10-25: 5 mg via INTRAVENOUS

## 2016-10-23 MED ORDER — HYDROMORPHONE HCL 1 MG/ML IJ SOLN
INTRAMUSCULAR | Status: AC
  Administered 2016-10-23: 1 mg via INTRAVENOUS
  Filled 2016-10-23: qty 1

## 2016-10-23 MED ORDER — CEFUROXIME SODIUM 1.5 G IV SOLR
1.5000 g | Freq: Two times a day (BID) | INTRAVENOUS | Status: AC
  Administered 2016-10-23 – 2016-10-24 (×2): 1.5 g via INTRAVENOUS
  Filled 2016-10-23 (×2): qty 1.5

## 2016-10-23 MED ORDER — HYDROMORPHONE 1 MG/ML IV SOLN
INTRAVENOUS | Status: DC
  Administered 2016-10-23: 20:00:00 via INTRAVENOUS
  Administered 2016-10-24: 5.9 mg via INTRAVENOUS
  Administered 2016-10-24: 4.8 mg via INTRAVENOUS
  Administered 2016-10-24: 3 mg via INTRAVENOUS
  Administered 2016-10-24: 2.1 mg via INTRAVENOUS
  Administered 2016-10-24: 0.9 mg via INTRAVENOUS
  Administered 2016-10-25: 4 mg via INTRAVENOUS
  Administered 2016-10-25: 10:00:00 via INTRAVENOUS
  Administered 2016-10-25: 1.8 mg via INTRAVENOUS
  Administered 2016-10-25: 1.5 mg via INTRAVENOUS
  Administered 2016-10-25: 1.2 mg via INTRAVENOUS
  Administered 2016-10-25: 2.1 mg via INTRAVENOUS
  Administered 2016-10-25: 2.37 mg via INTRAVENOUS
  Administered 2016-10-26: 1.8 mL via INTRAVENOUS
  Administered 2016-10-26: 3.3 mL via INTRAVENOUS
  Administered 2016-10-26: 1.8 mg via INTRAVENOUS
  Administered 2016-10-26: 3.3 mL via INTRAVENOUS
  Administered 2016-10-26: 0.9 mg via INTRAVENOUS
  Administered 2016-10-26: 1.5 mg via INTRAVENOUS
  Administered 2016-10-27: 1.2 mg via INTRAVENOUS
  Administered 2016-10-27: 2.4 mg via INTRAVENOUS
  Administered 2016-10-27: 09:00:00 via INTRAVENOUS
  Administered 2016-10-28: 1.5 mg via INTRAVENOUS
  Administered 2016-10-28: 5.4 mg via INTRAVENOUS
  Administered 2016-10-28: 3.6 mg via INTRAVENOUS
  Administered 2016-10-29: 02:00:00 via INTRAVENOUS
  Administered 2016-10-29: 1.2 mg via INTRAVENOUS
  Administered 2016-10-29: 2.42 mg via INTRAVENOUS
  Administered 2016-10-29: 1.5 mg via INTRAVENOUS
  Administered 2016-10-29: 3.3 mg via INTRAVENOUS
  Administered 2016-10-29: 2.56 mg via INTRAVENOUS
  Administered 2016-10-29: 1.61 mg via INTRAVENOUS
  Administered 2016-10-30: 1.8 mg via INTRAVENOUS
  Administered 2016-10-30: 2.1 mg via INTRAVENOUS
  Administered 2016-10-30: 2.7 mg via INTRAVENOUS
  Filled 2016-10-23 (×4): qty 25

## 2016-10-23 MED ORDER — HYDROMORPHONE HCL 1 MG/ML IJ SOLN
INTRAMUSCULAR | Status: AC
  Administered 2016-10-23: 0.5 mg
  Filled 2016-10-23: qty 1

## 2016-10-23 MED ORDER — ACETAMINOPHEN 325 MG PO TABS: 325.0000 mg | ORAL_TABLET | ORAL | Status: DC | PRN

## 2016-10-23 MED ORDER — HYDROMORPHONE HCL 1 MG/ML IJ SOLN
0.2500 mg | INTRAMUSCULAR | Status: DC | PRN
  Administered 2016-10-23: 0.5 mg via INTRAVENOUS
  Administered 2016-10-23: 1 mg via INTRAVENOUS

## 2016-10-23 MED ORDER — SODIUM CHLORIDE 0.9% FLUSH: 9.0000 mL | INTRAVENOUS | Status: DC | PRN

## 2016-10-23 SURGICAL SUPPLY — 55 items
ADH SKN CLS APL DERMABOND .7 (GAUZE/BANDAGES/DRESSINGS) ×2
CANISTER SUCT 3000ML PPV (MISCELLANEOUS) ×3 IMPLANT
CLIP LIGATING EXTRA MED SLVR (CLIP) ×2 IMPLANT
CLIP VESOCCLUDE MED 24/CT (CLIP) ×3 IMPLANT
CLIP VESOCCLUDE SM WIDE 24/CT (CLIP) ×3 IMPLANT
DERMABOND ADVANCED (GAUZE/BANDAGES/DRESSINGS) ×4
DERMABOND ADVANCED .7 DNX12 (GAUZE/BANDAGES/DRESSINGS) ×2 IMPLANT
DRSG COVADERM 4X14 (GAUZE/BANDAGES/DRESSINGS) ×2 IMPLANT
DRSG COVADERM 4X6 (GAUZE/BANDAGES/DRESSINGS) ×2 IMPLANT
ELECT BLADE 4.0 EZ CLEAN MEGAD (MISCELLANEOUS) ×3
ELECT BLADE 6.5 EXT (BLADE) IMPLANT
ELECT REM PT RETURN 9FT ADLT (ELECTROSURGICAL) ×3
ELECTRODE BLDE 4.0 EZ CLN MEGD (MISCELLANEOUS) ×1 IMPLANT
ELECTRODE REM PT RTRN 9FT ADLT (ELECTROSURGICAL) ×1 IMPLANT
GLOVE BIO SURGEON STRL SZ 6.5 (GLOVE) ×1 IMPLANT
GLOVE BIO SURGEON STRL SZ7.5 (GLOVE) ×3 IMPLANT
GLOVE BIO SURGEONS STRL SZ 6.5 (GLOVE) ×1
GLOVE BIOGEL PI IND STRL 7.0 (GLOVE) IMPLANT
GLOVE BIOGEL PI INDICATOR 7.0 (GLOVE) ×2
GOWN STRL REUS W/ TWL LRG LVL3 (GOWN DISPOSABLE) ×2 IMPLANT
GOWN STRL REUS W/ TWL XL LVL3 (GOWN DISPOSABLE) ×1 IMPLANT
GOWN STRL REUS W/TWL LRG LVL3 (GOWN DISPOSABLE) ×6
GOWN STRL REUS W/TWL XL LVL3 (GOWN DISPOSABLE) ×3
GRAFT HEMASHIELD 18X9MM (Vascular Products) ×2 IMPLANT
HEMOSTAT SNOW SURGICEL 2X4 (HEMOSTASIS) ×2 IMPLANT
INSERT FOGARTY 61MM (MISCELLANEOUS) IMPLANT
INSERT FOGARTY SM (MISCELLANEOUS) ×6 IMPLANT
KIT BASIN OR (CUSTOM PROCEDURE TRAY) ×3 IMPLANT
KIT ROOM TURNOVER OR (KITS) ×3 IMPLANT
NS IRRIG 1000ML POUR BTL (IV SOLUTION) ×6 IMPLANT
PACK AORTA (CUSTOM PROCEDURE TRAY) ×3 IMPLANT
PAD ARMBOARD 7.5X6 YLW CONV (MISCELLANEOUS) ×6 IMPLANT
SPONGE LAP 18X18 X RAY DECT (DISPOSABLE) ×8 IMPLANT
SUT MNCRL AB 4-0 PS2 18 (SUTURE) ×12 IMPLANT
SUT PDS AB 1 TP1 54 (SUTURE) ×4 IMPLANT
SUT PROLENE 2 0 MH 48 (SUTURE) ×4 IMPLANT
SUT PROLENE 3 0 SH1 36 (SUTURE) ×6 IMPLANT
SUT PROLENE 5 0 C 1 24 (SUTURE) ×8 IMPLANT
SUT PROLENE 5 0 C 1 36 (SUTURE) ×2 IMPLANT
SUT SILK 2 0 (SUTURE) ×6
SUT SILK 2 0 TIES 17X18 (SUTURE) ×3
SUT SILK 2 0SH CR/8 30 (SUTURE) ×3 IMPLANT
SUT SILK 2-0 18XBRD TIE 12 (SUTURE) ×1 IMPLANT
SUT SILK 2-0 18XBRD TIE BLK (SUTURE) ×1 IMPLANT
SUT SILK 3 0 (SUTURE) ×3
SUT SILK 3 0 TIES 17X18 (SUTURE) ×3
SUT SILK 3-0 18XBRD TIE 12 (SUTURE) ×1 IMPLANT
SUT SILK 3-0 18XBRD TIE BLK (SUTURE) ×1 IMPLANT
SUT VIC AB 2-0 CT1 27 (SUTURE) ×9
SUT VIC AB 2-0 CT1 TAPERPNT 27 (SUTURE) ×5 IMPLANT
SUT VIC AB 3-0 SH 27 (SUTURE) ×9
SUT VIC AB 3-0 SH 27X BRD (SUTURE) ×2 IMPLANT
TOWEL BLUE STERILE X RAY DET (MISCELLANEOUS) ×5 IMPLANT
TRAY FOLEY W/METER SILVER 16FR (SET/KITS/TRAYS/PACK) ×3 IMPLANT
WATER STERILE IRR 1000ML POUR (IV SOLUTION) ×6 IMPLANT

## 2016-10-23 NOTE — Progress Notes (Signed)
elink new patien eval    ICD-10-CM   1. S/P aortobifemoral bypass surgery Z95.828 DG Chest Port 1 View    DG Abd Portable 1V    DG Chest Port 1 View    DG Chest Port 1 View    DG Abd Portable 1V    DG Chest Port 1 View  2. S/P aortobifemoral bypass surgery Z95.828 DG Chest [facility] 1 View    DG Abd Portable 1V    DG Chest Port 1 View    DG Chest Port 1 View    DG Abd Portable 1V    DG Chest Port 1 View    Patient Active Problem List   Diagnosis Date Noted  . 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 Abdominal aortic aneurysm ([place]) 05/09/2014  . Other primary cardiomyopathies 06/12/2011  . Automatic implantable cardioverter-defibrillator in situ 09/10/2010  . Chronic systolic heart failure ([place]) 09/10/2010  . TOBACCO ABUSE 04/29/2010  . FEMORAL BRUIT, RIGHT 02/18/2010  . CVA 11/01/2009  . CARDIOMYOPATHY, ISCHEMIC 05/18/2008  . ACUTE BRONCHITIS 05/18/2008  . HYPERLIPIDEMIA-MIXED 05/17/2008  . Essential hypertension 05/17/2008  . Coronary atherosclerosis 05/17/2008  . GERD 05/17/2008    Camera - looks srtable awake   PULMONARY  Recent Labs Lab 10/23/16 0914 10/23/16 1038 10/23/16 1217 10/23/16 1317 10/23/16 1425  PHART 7.378 7.309* 7.298* 7.404 7.394  PCO2ART 39.8 42.8 46.0 40.1 37.9  PO2ART 216.0* 137.0* 178.0* 191.0* 176*  HCO3 23.8 21.7 22.6 25.1 22.8  TCO2 25 23 24 26   --   O2SAT 100.0 99.0 99.0 100.0 99.3    CBC  Recent Labs Lab 10/20/16 1457  10/23/16 1217 10/23/16 1317 10/23/16 1420  HGB 14.2  < > 10.5* 10.2* 11.9*  HCT 42.1  < > 31.0* 30.0* 35.4*  WBC 10.2  --   --   --  17.2*  PLT 175  --   --   --  152  < > = values in this interval not displayed.  COAGULATION  Recent Labs Lab 10/23/16 0628 10/23/16 1420  INR 1.00 1.18    CARDIAC  No results for input(s): TROPONINI in the last 168 hours. No results for input(s): PROBNP in the last 168 hours.   CHEMISTRY  Recent Labs Lab 10/20/16 1457 10/23/16 0914 10/23/16 1038  10/23/16 1217 10/23/16 1317 10/23/16 1420  NA 135 139 139 138 139 136  K 3.9 4.0 4.0 4.4 4.2 4.0  CL 105  --   --   --   --  107  CO2 21*  --   --   --   --  21*  GLUCOSE 143*  --   --   --   --  182*  BUN 14  --   --   --   --  13  CREATININE 0.95  --   --   --   --  0.99  CALCIUM 8.9  --   --   --   --  7.6*  MG  --   --   --   --   --  1.6*   Estimated Creatinine Clearance: 90.8 mL/min (by C-G formula based on SCr of 0.99 mg/dL).   LIVER  Recent Labs Lab 10/20/16 1457 10/23/16 0628 10/23/16 1420  AST 17  --   --   ALT 14*  --   --   ALKPHOS 53  --   --   BILITOT 0.9  --   --   PROT 6.2*  --   --  ALBUMIN 3.7  --   --   INR  --  1.00 1.18     INFECTIOUS No results for input(s): LATICACIDVEN, PROCALCITON in the last 168 hours.   ENDOCRINE CBG (last 3)  No results for input(s): GLUCAP in the last 72 hours.       IMAGING x48h  - image(s) personally visualized  -   highlighted in bold Dg Chest Port 1 View  Result Date: 10/23/2016 CLINICAL DATA:  Status post aortobifemoral bypass graft. EXAM: PORTABLE CHEST 1 VIEW COMPARISON:  Radiographs of April 02, 2010. FINDINGS: Stable cardiomediastinal silhouette. No pneumothorax or pleural effusion is noted. Left-sided pacemaker is unchanged in position. Nasogastric tube is seen entering stomach. Right internal jugular Swan-Ganz catheter is noted with tip directed into right pulmonary artery. Bony thorax is unremarkable. No acute pulmonary disease is noted. IMPRESSION: No acute cardiopulmonary abnormality seen. Electronically Signed   By: Marijo Conception, M.D.   On: 10/23/2016 14:55   Dg Abd Portable 1v  Result Date: 10/23/2016 CLINICAL DATA:  Aortobifemoral bypass. EXAM: PORTABLE ABDOMEN - 1 VIEW COMPARISON:  CT 09/12/2016 . FINDINGS: NG tube no with its tip in the stomach. Side hole is at the gastroesophageal junction. Advancement of approximately 8 cm should be considered. Surgical clips right upper quadrant and  pelvis. Air-filled loops of nondilated small bowel noted. Colonic gas pattern normal. Stool in the colon. No free air. Aortoiliac atherosclerotic vascular disease. Reference is made to prior recent CT report. Soft tissue air noted about both groins most likely related to recent procedure. IMPRESSION: 1. Nondilated air-filled loops of small bowel noted. Colonic gas pattern is normal. Stool in the colon. 2. Aortoiliac atherosclerotic vascular disease. reference is made to prior CT report 09/22/2016. Electronically Signed   By: Marcello Moores  Register   On: 10/23/2016 14:59   A) Low Mag replaced  P Nil acue from elink  Dr. Brand Males, M.D., [facility].C.P Pulmonary and Critical Care Medicine Staff Physician [facility] Pager: [phone], If no answer or between  15:00h - 7:00h: call [phone]  10/23/2016 10:06 PM

## 2016-10-23 NOTE — H&P (Signed)
   History and Physical Update  The patient was interviewed and re-examined.  The patient's previous History and Physical has been reviewed and is unchanged from office visit. Plan for aortobifemoral bypass grafting.   Hassan Blackshire C. Donzetta Matters, MD [facility] [place] Office: [phone] Pager: [phone]  10/23/2016, 7:14 AM

## 2016-10-23 NOTE — Op Note (Signed)
Patient name: [name] MRN: [id number] DOB: 04-08-1955 Sex: male  10/23/2016 Pre-operative Diagnosis: 1. AAA  2. Bilateral iliac artery occlusive disease  Post-operative diagnosis:  Same Surgeon:  Erlene Quan C. Donzetta Matters, MD Assistant: [name], PA Procedure Performed: 1.  Aortobifemoral bypass grafting with 18 x 45mm bifurcated dacron graft 2.  Left common femoral endarterectomy  Indications:  61 year old male with a history of right lower extremity rest pain and also has ulceration along the first metatarsal phalangeal joint. He recently underwent angiogram with 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 He underwent CT scan which demonstrated a 4.4 cm aneurysm as well as the occlusive disease in the bilateral common external iliac arteries as well as occlusive disease in the bilateral common femoral arteries. He was therefore indicated for aortobifemoral bypass.   Findings: The neck aneurysm measured approximately 20 mm in diameter was heavily calcified but was endarterectomized and anastomosis was performed without issue. Distally on the aorta it was oversewn although was also heavily calcified and the common iliac arteries bilaterally were tied off with umbilical tapes. The right common femoral artery was mildly aneurysmal and had significant gelatinous disease within but there was adequate backbleeding from the profunda femoris artery. The left common femoral artery had no backbleeding from the SFA or profunda femoris arteries and endarterectomy was performed until brisk backbleeding was noted from the profunda and sluggish backbleeding from the SFA. /The IMA had strong backbleeding and was ligated at its  takeoff from the aneursym. At completion there was a strong signal at the left anterior tibial artery with monophasic right anterior tibial artery signals.   Procedure:  The patient was identified in the holding area and taken to the operating room where his placed supine on the operating table general anesthesia was induced he was sterilely prepped and draped in his chest abdomen and bilateral thighs given antibiotics and timeout called. We began with a longitudinal incision overlying the weak femoral pulse left dissected down onto the common femoral artery. We then dissected out the SFA followed for some arteries as well as the external iliac artery and placed a vessel loop around this. We divided the crossing veins 2 between ties. Then placed vessel loops around the profunda femoris arteries and SFA arteries. We then turned our attention the right groin where similar procedure was undertaken with longitudinal incision dissecting out the common femoral artery which was noted to be mildly aneurysmal. We also dissected out the profunda femoris arteries as well as the external iliac arteries although there was no palpable pulsatility in these arteries. We again divided to crossing veins between ties and clips. We then began the tunnels overlying the external iliac arteries and thrombosed sides. A long midline incision was then made from just below the xiphoid to just above the pubis. We dissected down through skin subcutaneous fat identified our midline and opened the fascia the length of the incision as well as the peritoneum which was opened sharply. We took down the round ligament between ties. We confirmed placement of our NG tube and it was affixed by anesthesia. We then mobilized the small intestine after inspecting all of it and dividing several adhesions. This was placed into his right paracolic gutter. We  identified our aneurysm dissected some of the soft tissue off the top of the retroperitoneal and  began to mobilize her duodenum. We inspected our entire colon as well as our omentum which was then placed cephalad. The Omni-Tract retractor was then placed. We then dissected up the neck of the aneurysm identified our renal vein dissected this off and identified 2 right renal arteries as well as 1 left renal artery. We then dissected free our inferior mesenteric artery with the hopes of possibly preserving this. We then dissected down to our bifurcation and then down onto our common iliac arteries to the level of their bifurcations. We placed umbilical tapes around the common iliac arteries bilaterally. Tunnels were then created from the groins to the retroperitoneum into the abdomen and umbilical tapes were placed. The patient was given 10,000 units of heparin. The common iliac arteries bilaterally were then clamped followed by the neck of the aneurysm. We opened the aneurysm longitudinally and evacuated significant thrombus and aortic debris. We first performed endarterectomy of the distal aorta and oversewed this with a 3-0 Prolene suture in a running mattress fashion. We then had to ligate one lumbar artery near the neck of the aneurysm. We then selected an 18 x 9 mm graft beveled it superiorly and sewed it end to side to the neck of the aorta with 3-0 Prolene suture. We then allowed flushing through our graft we did have one area of loose suture and this was tightened at our anastomosis. We then tunneled both of our limbs and turned our attention to the groins and released our [place] tractor. We first clamped the fundus artery on the right followed by the external iliac artery and opened this longitudinally. There was some thrombus within the artery that was chronic and this was right greater. We did have significant back pain for the profunda femoris artery. The graft was trimmed to size and sewn end to side with 5-0 Prolene suture. Prior to completing this anastomosis we allowed flushing techniques.  Anesthesia was then notified we opened our anastomosis and restored blood flow to the right leg. A similar fashion and turned our attention to the left groin over clamped the outflow followed by the inflow and opened longitudinally but did have significant calcification and this artery. We didn't initially not have any backbleeding from our SFA or profunda femoris arteries and so endarterectomy was performed extending onto both the profunda and the SFA. And after we had brisk backbleeding from the profunda and middle back clean from the SFA we then trimmed the graft to size and sewed end-to-side with 5-0 Prolene suture. Again flushing techniques were undertaken anesthesia notified and blood flow restored to the left lower extremity. We then had strong signals and our profunda femoris arteries bilaterally and the feet were noted to be pink and warm. We inspected our inferior mesenteric artery which has strong backbleeding and there was a signal in the sigmoid colon all the way distal and the mesentery. With this the take of the IMA was ligated with a 2-0 silk tie. This time the patient was given 50 mg of protamine and hemostasis was obtained in all our wounds. The groins were first closed after irrigating with 2-0 Vicryl followed by 3-0 Vicryl for Monocryl at the skin. We closed the retroperitoneum over our graft with 2-0 Vicryl reinspected our small intestine and large intestine which noted to be viable again confirmed placement or NG tube. The omentum was draped over the incision this was closed  with a #1 PDS suture. The skin was then closed with 4-0 Monocryl suture. Patient was then allowed awaken from anesthesia having tolerated the procedure without immediate complication. All counts were correct at completion.  Blood loss: 1200cc  UOP: 400cc   Crystalloid: 3000cc  Cell saver: 500cc  Joyce Heitman C. Donzetta Matters, MD [facility] [place] Office: [phone] Pager: [phone]

## 2016-10-23 NOTE — Anesthesia Procedure Notes (Signed)
Procedure Name: Intubation Date/Time: 10/23/2016 8:12 AM Performed by: Murvin Natal Pre-anesthesia Checklist: Patient identified, Emergency Drugs available, Suction available, Patient being monitored and Timeout performed Patient Re-evaluated:Patient Re-evaluated prior to induction Oxygen Delivery Method: Circle system utilized Preoxygenation: Pre-oxygenation with 100% oxygen Induction Type: IV induction and Cricoid Pressure applied Ventilation: Mask ventilation without difficulty Laryngoscope Size: Mac and 4 Grade View: Grade II Tube type: Subglottic suction tube Tube size: 7.5 mm Number of attempts: 1 Airway Equipment and Method: Stylet Placement Confirmation: ETT inserted through vocal cords under direct vision,  positive ETCO2,  CO2 detector and breath sounds checked- equal and bilateral Secured at: 22 (teeth) cm Tube secured with: Tape Dental Injury: Teeth and Oropharynx as per pre-operative assessment  Comments: ETT placed by Sherie Don, SRNA

## 2016-10-23 NOTE — Anesthesia Postprocedure Evaluation (Signed)
Anesthesia Post Note  Patient: DREY SHAFF  Procedure(s) Performed: Procedure(s) (LRB): AORTA BIFEMORAL BYPASS GRAFT USING HEMASHIELD GOLD (Bilateral)     Patient location during evaluation: SICU Anesthesia Type: General Level of consciousness: awake Pain management: pain level controlled Vital Signs Assessment: post-procedure vital signs reviewed and stable Respiratory status: nonlabored ventilation, patient connected to nasal cannula oxygen, spontaneous breathing and respiratory function stable Cardiovascular status: stable Postop Assessment: no apparent nausea or vomiting Anesthetic complications: no    Last Vitals:  Vitals:   10/23/16 1530 10/23/16 1545  BP: (!) 145/91   Pulse: 80 80  Resp: 14 17  Temp:  36.7 C  SpO2: 99% 99%    Last Pain:  Vitals:   10/23/16 1545  TempSrc:   PainSc: Asleep                 Ryan P Ellender

## 2016-10-23 NOTE — Anesthesia Procedure Notes (Addendum)
Central Venous Catheter Insertion Performed by: Belinda Block Start/End9/13/2018 6:55 AM, 10/23/2016 7:10 AM Preanesthetic checklist: patient identified, IV checked, site marked, risks and benefits discussed, surgical consent, monitors and equipment checked, pre-op evaluation and timeout performed Position: Trendelenburg Lidocaine 1% used for infiltration Catheter size: 8.5 Fr PA cath was placed.Sheath introducer Swan type:thermodilution Procedure performed using ultrasound guided technique. Ultrasound Notes:anatomy identified Attempts: 1 Following insertion, line sutured and dressing applied. Post procedure assessment: blood return through all ports  Patient tolerated the procedure well with no immediate complications.

## 2016-10-23 NOTE — Transfer of Care (Signed)
Immediate Anesthesia Transfer of Care Note  Patient: [name]  Procedure(s) Performed: Procedure(s): AORTA BIFEMORAL BYPASS GRAFT USING HEMASHIELD GOLD (Bilateral)  Patient Location: PACU  Anesthesia Type:General  Level of Consciousness: awake, alert  and oriented  Airway & Oxygen Therapy: Patient Spontanous Breathing and Patient connected to face mask oxygen  Post-op Assessment: Report given to RN, Post -op Vital signs reviewed and stable and Patient moving all extremities X 4  Post vital signs: Reviewed and stable  Last Vitals:  Vitals:   10/23/16 0600  BP: 132/68  Pulse: (!) 55  Resp: 20  Temp: (!) 36.3 C  SpO2: 100%    Last Pain:  Vitals:   10/23/16 0600  TempSrc: Oral      Patients Stated Pain Goal: 3 (82/57/49 3552)  Complications: No apparent anesthesia complications

## 2016-10-23 NOTE — Anesthesia Preprocedure Evaluation (Addendum)
Anesthesia Evaluation  Patient identified by MRN, date of birth, ID band Patient awake    Reviewed: Allergy & Precautions, NPO status , Patient's Chart, lab work & pertinent test results  Airway Mallampati: II  TM Distance: >3 FB Neck ROM: Full    Dental no notable dental hx.    Pulmonary sleep apnea , Current Smoker,    Pulmonary exam normal breath sounds clear to auscultation       Cardiovascular hypertension, Pt. on medications and Pt. on home beta blockers + CAD, + Past MI and +CHF  Normal cardiovascular exam+ pacemaker + Cardiac Defibrillator  Rhythm:Regular Rate:Normal  ECG: NSR, rate 67  EF is under 35%  Cardiologist - [name]  1. Abdominal aortic aneurysm at least moderate in size with extensive mural thrombus. 2. Significant bilateral iliac disease worse on the right side. 3. Right lower extremity: Significant calcified disease affecting the right common femoral artery followed by an occluded SFA with reconstitution in the distal SFA via collaterals and 1 vessel runoff via the anterior tibial artery. 4. Left lower extremity: Flush occlusion of the left SFA into the popliteal artery with reconstitution below the knee in the anterior tibial and peroneal arteries.  Inform patient that stress test was abnormal but not different from prior cardiac testing. Given his low EF, he is at moderate to high risk for aortobifemoral bypass. I'm going to forward this to Dr. Donnetta Hutching for whom I referred the patient to. Endovascular repair of his aortic aneurysm and iliac disease is preferable over surgical repair.  Nuclear stress test 09/25/16:  The left ventricular ejection fraction is moderately decreased (30-44%). Nuclear stress EF: 33%. Defect 1: There is a medium defect of severe severity present in the basal inferior, mid inferior and apical inferior location. This is a high risk study. Findings consistent with prior myocardial  infarction. High risk nuclear study due to a severe inferior scar and moderate to severe reduction in overall left ventricular function. There is minimal peri-infarct reversible ischemia. There are no large areas of ischemia    Neuro/Psych negative psych ROS   GI/Hepatic Neg liver ROS,   Endo/Other  negative endocrine ROS  Renal/GU negative Renal ROS     Musculoskeletal negative musculoskeletal ROS (+)   Abdominal   Peds  Hematology negative hematology ROS (+)   Anesthesia Other Findings Hyperlipidemia  Reproductive/Obstetrics                            Anesthesia Physical Anesthesia Plan  ASA: IV  Anesthesia Plan: General   Post-op Pain Management:    Induction: Intravenous  PONV Risk Score and Plan: 1 and Ondansetron and Dexamethasone  Airway Management Planned: Oral ETT  Additional Equipment: Arterial line, CVP, PA Cath and Ultrasound Guidance Line Placement  Intra-op Plan:   Post-operative Plan: Extubation in OR and Possible Post-op intubation/ventilation  Informed Consent: I have reviewed the patients History and Physical, chart, labs and discussed the procedure including the risks, benefits and alternatives for the proposed anesthesia with the patient or authorized representative who has indicated his/her understanding and acceptance.   Dental advisory given  Plan Discussed with: CRNA  Anesthesia Plan Comments:        Anesthesia Quick Evaluation

## 2016-10-23 NOTE — Anesthesia Procedure Notes (Signed)
Arterial Line Insertion Start/End9/13/2018 6:45 AM, 10/23/2016 6:52 AM Performed by: Leonor Liv, CRNA  Patient location: Pre-op. Preanesthetic checklist: patient identified, IV checked, risks and benefits discussed, surgical consent and monitors and equipment checked Lidocaine 1% used for infiltration Right, radial was placed Catheter size: 20 G Hand hygiene performed , maximum sterile barriers used  and Seldinger technique used Allen's test indicative of satisfactory collateral circulation Attempts: 1 Procedure performed without using ultrasound guided technique. Following insertion, dressing applied and Biopatch. Post procedure assessment: normal  Patient tolerated the procedure well with no immediate complications. Additional procedure comments: Line placed by Sherie Don, SRNA under supervision of S. Abbie Sons, Immunologist.

## 2016-10-24 ENCOUNTER — Encounter (HOSPITAL_COMMUNITY): Payer: Self-pay | Admitting: Vascular Surgery

## 2016-10-24 ENCOUNTER — Inpatient Hospital Stay (HOSPITAL_COMMUNITY): Payer: Medicare Other

## 2016-10-24 LAB — BLOOD GAS, ARTERIAL
ACID-BASE DEFICIT: 1.4 mmol/L (ref 0.0–2.0)
Bicarbonate: 23.3 mmol/L (ref 20.0–28.0)
FIO2: 28
O2 Content: 2 L/min
O2 Saturation: 94 %
PCO2 ART: 42.3 mmHg (ref 32.0–48.0)
PH ART: 7.36 (ref 7.350–7.450)
Patient temperature: 98.4
pO2, Arterial: 73.4 mmHg — ABNORMAL LOW (ref 83.0–108.0)

## 2016-10-24 LAB — CBC
HCT: 35 % — ABNORMAL LOW (ref 39.0–52.0)
HEMATOCRIT: 34.9 % — AB (ref 39.0–52.0)
HEMOGLOBIN: 11.7 g/dL — AB (ref 13.0–17.0)
Hemoglobin: 11.6 g/dL — ABNORMAL LOW (ref 13.0–17.0)
MCH: 29 pg (ref 26.0–34.0)
MCH: 28.6 pg (ref 26.0–34.0)
MCHC: 33.5 g/dL (ref 30.0–36.0)
MCHC: 33.1 g/dL (ref 30.0–36.0)
MCV: 86.6 fL (ref 78.0–100.0)
MCV: 86.4 fL (ref 78.0–100.0)
Platelets: 121 10*3/uL — ABNORMAL LOW (ref 150–400)
Platelets: 123 10*3/uL — ABNORMAL LOW (ref 150–400)
RBC: 4.03 MIL/uL — ABNORMAL LOW (ref 4.22–5.81)
RBC: 4.05 MIL/uL — AB (ref 4.22–5.81)
RDW: 13.5 % (ref 11.5–15.5)
RDW: 13.5 % (ref 11.5–15.5)
WBC: 13.3 10*3/uL — AB (ref 4.0–10.5)
WBC: 15 10*3/uL — AB (ref 4.0–10.5)

## 2016-10-24 LAB — COMPREHENSIVE METABOLIC PANEL
ALK PHOS: 28 U/L — AB (ref 38–126)
ALT: 14 U/L — AB (ref 17–63)
AST: 33 U/L (ref 15–41)
Albumin: 3 g/dL — ABNORMAL LOW (ref 3.5–5.0)
Anion gap: 4 — ABNORMAL LOW (ref 5–15)
BILIRUBIN TOTAL: 0.7 mg/dL (ref 0.3–1.2)
BUN: 12 mg/dL (ref 6–20)
CHLORIDE: 105 mmol/L (ref 101–111)
CO2: 24 mmol/L (ref 22–32)
CREATININE: 0.89 mg/dL (ref 0.61–1.24)
Calcium: 7.8 mg/dL — ABNORMAL LOW (ref 8.9–10.3)
GFR calc Af Amer: >60 mL/min (ref >60)
Glucose, Bld: 164 mg/dL — ABNORMAL HIGH (ref 65–99)
Potassium: 4.3 mmol/L (ref 3.5–5.1)
Sodium: 133 mmol/L — ABNORMAL LOW (ref 135–145)
TOTAL PROTEIN: 4.8 g/dL — AB (ref 6.5–8.1)

## 2016-10-24 LAB — MAGNESIUM: Magnesium: 2.2 mg/dL (ref 1.7–2.4)

## 2016-10-24 LAB — CREATININE, SERUM
Creatinine, Ser: 0.82 mg/dL (ref 0.61–1.24)
GFR calc non Af Amer: >60 mL/min (ref >60)

## 2016-10-24 LAB — AMYLASE: Amylase: 29 U/L (ref 28–100)

## 2016-10-24 IMAGING — DX DG CHEST 1V PORT
1 series · 1 of 1 positions shown · non-contrast
Comparison: Portable exam [WH] hours compared to [DATE]

CLINICAL DATA: Post aortobifemoral bypass surgery

EXAM:
PORTABLE CHEST 1 VIEW

[chest]
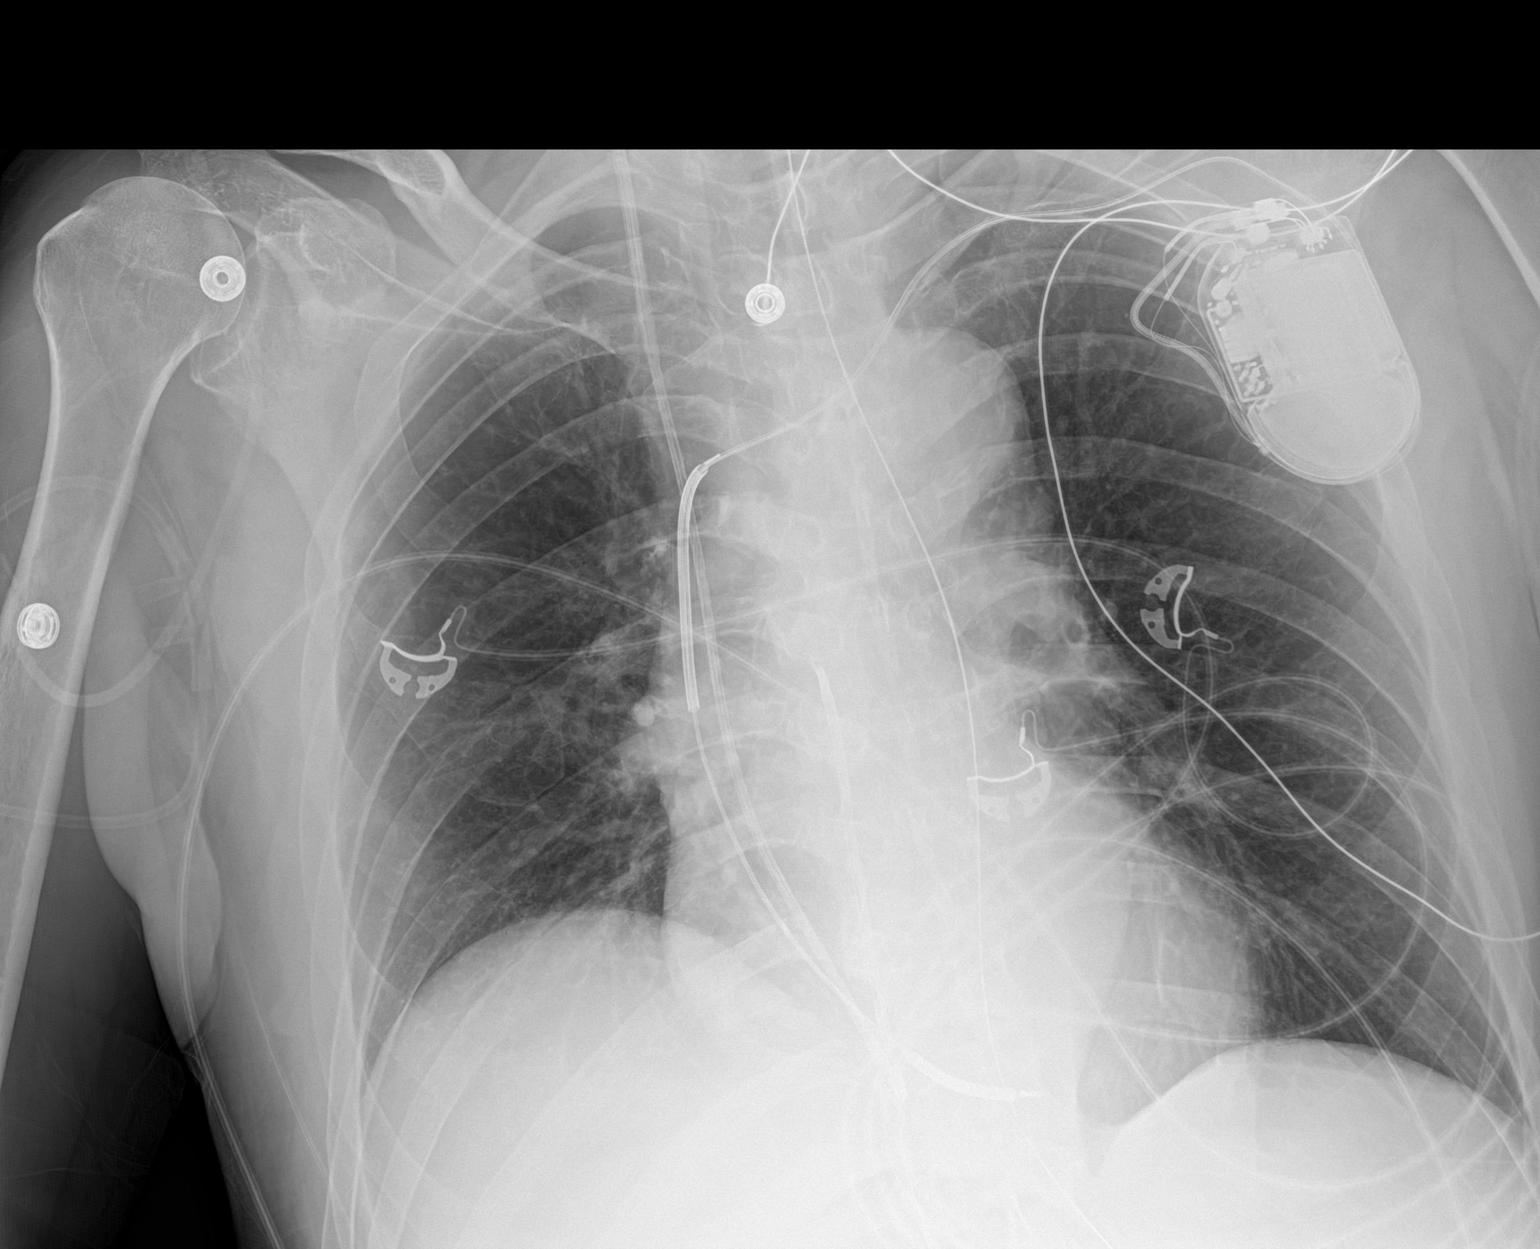

[1 of 1 positions shown; findings below may reference images not displayed]

FINDINGS: LEFT subclavian AICD with lead projecting over RIGHT ventricle
unchanged.

RIGHT jugular Swan-Ganz catheter with tip projecting over
bifurcation of main pulmonary artery.

Nasogastric tube extends into stomach.

Normal heart size and pulmonary vascularity.

Atherosclerotic calcification and tortuosity of thoracic aorta.

Central peribronchial thickening without infiltrate, pleural
effusion or pneumothorax.

Bones demineralized.
IMPRESSION: Bronchitic changes without acute infiltrate.

## 2016-10-24 MED ORDER — CHLORHEXIDINE GLUCONATE CLOTH 2 % EX PADS
6.0000 | MEDICATED_PAD | Freq: Every day | CUTANEOUS | Status: DC
Start: 2016-10-24 — End: 2016-11-02
  Administered 2016-10-24 – 2016-10-31 (×6): 6 via TOPICAL

## 2016-10-24 MED ORDER — CHLORHEXIDINE GLUCONATE 0.12 % MT SOLN
15.0000 mL | Freq: Two times a day (BID) | OROMUCOSAL | Status: DC
  Administered 2016-10-24 – 2016-11-01 (×16): 15 mL via OROMUCOSAL
  Filled 2016-10-24 (×14): qty 15

## 2016-10-24 MED ORDER — SODIUM CHLORIDE 0.9% FLUSH: 10.0000 mL | INTRAVENOUS | Status: DC | PRN

## 2016-10-24 MED ORDER — HEPARIN SODIUM (PORCINE) 5000 UNIT/ML IJ SOLN
5000.0000 [IU] | Freq: Three times a day (TID) | INTRAMUSCULAR | Status: DC
  Administered 2016-10-24 – 2016-10-29 (×16): 5000 [IU] via SUBCUTANEOUS
  Filled 2016-10-24 (×17): qty 1

## 2016-10-24 MED ORDER — SODIUM CHLORIDE 0.9% FLUSH
10.0000 mL | Freq: Two times a day (BID) | INTRAVENOUS | Status: DC
  Administered 2016-10-24 – 2016-11-01 (×9): 10 mL

## 2016-10-24 MED ORDER — ORAL CARE MOUTH RINSE
15.0000 mL | Freq: Two times a day (BID) | OROMUCOSAL | Status: DC
  Administered 2016-10-24 – 2016-11-01 (×10): 15 mL via OROMUCOSAL

## 2016-10-24 MED FILL — Sodium Chloride IV Soln 0.9%: INTRAVENOUS | Qty: 2000 | Status: AC

## 2016-10-24 MED FILL — Heparin Sodium (Porcine) Inj 1000 Unit/ML: INTRAMUSCULAR | Qty: 30 | Status: AC

## 2016-10-24 NOTE — Care Management Note (Addendum)
Case Management Note Marvetta Gibbons RN, BSN Unit 4E-Case Manager-- [facility] coverage [phone]   Patient Details  Name: [name] MRN: [id number] Date of Birth: 1955/10/03  Subjective/Objective:   Pt admitted s/p Aortobifemoral bypass grafting                 Action/Plan: PTA pt lived at home with spouse- CM to follow for d/c needs- PT/OT evals pending  Expected Discharge Date:                  Expected Discharge Plan:     In-House Referral:     Discharge planning Services  CM Consult  Post Acute Care Choice:    Choice offered to:     DME Arranged:    DME Agency:     HH Arranged:    HH Agency:     Status of Service:  In process, will continue to follow  If discussed at Long Length of Stay Meetings, dates discussed:    Discharge Disposition:   Additional Comments:  Dawayne Patricia, RN 10/24/2016, 8:44 AM

## 2016-10-24 NOTE — Progress Notes (Signed)
  Progress Note    10/24/2016 8:23 AM 1 Day Post-Op  Subjective:  Intermittent abdominal pain, feet feeling better than preop  Vitals:   10/24/16 0742 10/24/16 0800  BP:  106/71  Pulse:  88  Resp: 13 16  Temp:    SpO2: 95% 95%    Physical Exam: aaox3 Non labored respirations Abdomen is soft, bandage on midline incision Bilateral groin incisions cdi Monophasic AT bilaterally, brisk cap refill bilaterally  CBC    Component Value Date/Time   WBC 13.3 (H) 10/24/2016 0506   RBC 4.03 (L) 10/24/2016 0506   HGB 11.7 (L) 10/24/2016 0506   HGB 14.8 09/10/2016 1406   HCT 34.9 (L) 10/24/2016 0506   HCT 43.7 09/10/2016 1406   PLT 121 (L) 10/24/2016 0506   PLT 181 09/10/2016 1406   MCV 86.6 10/24/2016 0506   MCV 87 09/10/2016 1406   MCH 29.0 10/24/2016 0506   MCHC 33.5 10/24/2016 0506   RDW 13.5 10/24/2016 0506   RDW 14.3 09/10/2016 1406   LYMPHSABS 1.3 03/27/2010 1124   MONOABS 0.8 03/27/2010 1124   EOSABS 0.4 03/27/2010 1124   BASOSABS 0.0 03/27/2010 1124    BMET    Component Value Date/Time   NA 133 (L) 10/24/2016 0506   NA 140 09/10/2016 1406   K 4.3 10/24/2016 0506   CL 105 10/24/2016 0506   CO2 24 10/24/2016 0506   GLUCOSE 164 (H) 10/24/2016 0506   BUN 12 10/24/2016 0506   BUN 10 09/10/2016 1406   CREATININE 0.89 10/24/2016 0506   CREATININE 0.88 05/17/2014 0819   CALCIUM 7.8 (L) 10/24/2016 0506   GFRNONAA >60 10/24/2016 0506   GFRNONAA >89 05/17/2014 0819   GFRAA >60 10/24/2016 0506   GFRAA >89 05/17/2014 0819    INR    Component Value Date/Time   INR 1.18 10/23/2016 1420     Intake/Output Summary (Last 24 hours) at 10/24/16 0823 Last data filed at 10/24/16 0800  Gross per 24 hour  Intake           5144.4 ml  Output             2893 ml  Net           2251.4 ml     [name] is post op #1 AAA and AI occlusive disease, has residual outflow disease  Plan: Neuro: continue pca Pulm: out of bed CV: stable overnight, can  remove swan GI: continue ng tube, ok for ice chips, ng was pulled back overnight will get kub to check placement FEN: adequate uop, Cr stable, leave foley x 1 day. ivf to stay at 75cc/hr PPx: sub q heparin, will need to restart anticoagulation at some point Dispo: continue icu care, oob today   Acadia Thammavong C. Donzetta Matters, MD [facility] Office: [phone] Pager: [phone]  10/24/2016 8:23 AM

## 2016-10-24 NOTE — Progress Notes (Signed)
RT found pt on Panora 2 L, sat 80%.  Pulse ox replaced but sat remained below 92%.  RT placed pt on Venturi mask 8 L, 40%, sat improved to 93%. Pt able to reach 750 using incventive spirometer.

## 2016-10-24 NOTE — Evaluation (Signed)
Physical Therapy Evaluation Patient Details Name: [name] MRN: [id number] DOB: [birth year] Today's Date: 10/24/2016   History of Present Illness  61 year old male with a history of right lower extremity rest pain and also has ulceration along the first metatarsal phalangeal joint. He recently underwent angiogram with 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 He underwent CT scan which demonstrated a 4.4 cm aneurysm as well as the occlusive disease in the bilateral common external iliac arteries as well as occlusive disease in the bilateral common femoral arteries. He was therefore indicated for aortobifemoral bypass.   Past Medical History:  Diagnosis Date  . AAA (abdominal aortic aneurysm) ([place])   . AICD (automatic cardioverter/defibrillator) present   . CAD (coronary artery disease)    status post diaphragmatic wall infarction in 1995, treated with percutaneous transluminal coronary  angioplasty with subsequent stenting of the right coronary artery in 2002.   [name] CHF (congestive heart failure) ([place])   . GERD (gastroesophageal reflux disease)   . Hyperlipidemia   . Hypertension    Intolerant to MULTIPLE CHOLESTEROL MEDICATIONS.  [name] Myocardial infarction ([place])   . Neuromuscular disorder ([place])   . PFO (patent foramen ovale)   . Pneumonia   . Presence of permanent cardiac pacemaker   . Sleep apnea   . Status post cholecystectomy   . Stroke [facility])     Clinical Impression  Pt admitted with above diagnosis. Pt currently with functional limitations due to the deficits listed below (see PT Problem List).Pt was able to ambulate with RW with min assist and cues for safety.  Slow gait and needed chair follow.   Should progress well. HR 98-143 bpm with activity.  O2 89-93% on RA with activity.  Replaced 2LO2 after walk due to PCA in place.  Pt pushed PCA button 3 x during treatment.  BP 114/82 at end of treatment.   Pt will benefit from skilled PT to increase their independence and safety with mobility to allow discharge to the venue listed below.      Follow Up Recommendations Home health PT;Supervision/Assistance - 24 hour    Equipment Recommendations  None recommended by PT    Recommendations for Other Services       Precautions / Restrictions Precautions Precautions: Fall Restrictions Weight Bearing Restrictions: No      Mobility  Bed Mobility               General bed mobility comments: in chair on arrival  Transfers Overall transfer level: Needs assistance Equipment used: Rolling walker (2 wheeled) Transfers: Sit to/from Stand Sit to Stand: Mod assist;+2 safety/equipment         General transfer comment: Pt needed assist to power up.  Pt was able to stand with help and cues.  Took incr time because of pain.    Ambulation/Gait Ambulation/Gait assistance: Min assist;+2 safety/equipment Ambulation Distance (Feet): 75 Feet Assistive device: Rolling walker (2 wheeled) Gait Pattern/deviations: Step-through pattern;Decreased stride length;Antalgic;Drifts right/left;Trunk flexed   Gait velocity interpretation: Below normal speed for age/gender General Gait Details: Very slow gait.  Short step lengths at times.  Pt limited by pain.  Pt needed chair follow and did have to rest after 75 feet.  Slightly dizzy per pt but VSS.  HR did incr most likely due to pain.    Stairs            Wheelchair Mobility    Modified Rankin (Stroke Patients Only)       Balance Overall balance assessment: Needs assistance Sitting-balance support: Feet supported;Bilateral upper extremity supported Sitting balance-Leahy Scale: Poor Sitting balance - Comments: relies on UE support    Standing balance support: Bilateral upper extremity supported;During functional activity Standing balance-Leahy Scale: Poor Standing balance comment: relies on UE support for balance                             Pertinent Vitals/Pain Pain Assessment: 0-10 Pain Score: 5  Pain Location: abdomen, bil LES Pain Descriptors / Indicators: Aching;Grimacing;Guarding Pain Intervention(s): Limited activity within patient's tolerance;Repositioned;Monitored during session;Premedicated before session;PCA encouraged    Home Living Family/patient expects to be discharged to:: Private residence Living Arrangements: Spouse/significant other Available Help at Discharge: Family;Available 24 hours/day Type of Home: House Home Access: Ramped entrance     Home Layout: One level Home Equipment: Walker - 2 wheels;Cane - single point;Wheelchair - manual;Crutches;Transport chair;Bedside commode;Shower seat      Prior Function Level of Independence: Independent;Independent with assistive device(s)         Comments: uswd cane some per pt PTA     Hand Dominance        Extremity/Trunk Assessment   Upper Extremity Assessment Upper Extremity Assessment: Defer to OT evaluation    Lower Extremity Assessment Lower Extremity Assessment: Generalized weakness    Cervical / Trunk Assessment Cervical / Trunk Assessment: Normal  Communication   Communication: No difficulties  Cognition Arousal/Alertness: Awake/alert Behavior During Therapy: WFL for tasks assessed/performed Overall Cognitive Status: Within Functional Limits for tasks assessed                                        General Comments      Exercises     Assessment/Plan    PT Assessment Patient needs continued PT services  PT Problem List Decreased activity tolerance;Decreased balance;Decreased mobility;Decreased knowledge of use of DME;Decreased safety awareness;Decreased knowledge of  precautions;Cardiopulmonary status limiting activity;Pain       PT Treatment Interventions DME instruction;Gait training;Stair training;Functional mobility training;Therapeutic activities;Therapeutic exercise;Balance training;Patient/family education    PT Goals (Current goals can be found in the Care Plan section)  Acute Rehab PT Goals Patient Stated Goal: to go home PT Goal Formulation: With patient Time For Goal Achievement: 11/07/16 Potential to Achieve Goals: Good    Frequency Min 3X/week   Barriers to discharge        Co-evaluation               AM-PAC PT "6 Clicks" Daily Activity  Outcome Measure Difficulty turning over in bed (including adjusting bedclothes, sheets and blankets)?: A Lot Difficulty moving from lying on back to sitting on the side of the bed? : A Lot Difficulty sitting down on and standing up from a chair with arms (e.g., wheelchair, bedside commode, etc,.)?: A Lot Help needed moving to and from a bed to chair (including a wheelchair)?: A Lot Help needed walking in hospital room?: A Lot Help needed climbing 3-5 steps with a railing? : Total 6 Click Score: 11    End of Session Equipment Utilized During Treatment: Gait belt;Oxygen Activity Tolerance: Patient limited by fatigue;Patient limited  by pain Patient left: in chair;with call bell/phone within reach Nurse Communication: Mobility status PT Visit Diagnosis: Unsteadiness on feet (R26.81);Muscle weakness (generalized) (M62.81)    Time: 0131-4388 PT Time Calculation (min) (ACUTE ONLY): 25 min   Charges:   PT Evaluation $PT Eval Moderate Complexity: 1 Mod PT Treatments $Gait Training: 8-22 mins   PT G Codes:        Phinneas Shakoor,PT Acute Rehabilitation [phone] (pager)   Denice Paradise 10/24/2016, 1:45 PM

## 2016-10-25 MED ORDER — BISACODYL 10 MG RE SUPP
10.0000 mg | Freq: Once | RECTAL | Status: AC
  Administered 2016-10-25: 10 mg via RECTAL
  Filled 2016-10-25: qty 1

## 2016-10-25 NOTE — Evaluation (Signed)
Occupational Therapy Evaluation Patient Details Name: [facility] ST[facility]UR MRN: [id number] DOB: 1955/02/18 Today's Date: 10/25/2016    History of Present Illness 61 year old male with a history of right lower extremity rest pain and also has ulceration along the first metatarsal phalangeal joint. He recently underwent angiogram with 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 He underwent CT scan which demonstrated a 4.4 cm aneurysm as well as the occlusive disease in the bilateral common external iliac arteries as well as occlusive disease in the bilateral common femoral arteries. He was therefore indicated for aortobifemoral bypass.    Clinical Impression   PTA, pt reports independence with ADL and functional mobility. Per PT note, he was utilizing RW at times; however, no report of use of assistive device to OT. Pt currently requires min assist for stand-pivot toilet transfer to [facility] and was able to complete twice for bowel movement this session. He additionally requires mod assist for bed mobility in preparation for seated ADL as well as max assist for LB ADL and toileting hygiene. Pt with HR ranging from 90-110 prior to activity and 120s with initial transfer to [facility]. He demonstrated momentary HR spike to 145 when highly anxious concerning "making it" to [facility] but quickly returned to 120 once seated and RN notified.  After return to bed, pt with SpO2 desaturation to 83% on 3L O2 and rebounded to 87% with pursed lip breathing techniques. After approximately 10 minutes resting in bed, pt with SpO2 92%. RN aware and addressing. Pt is highly motivated to participate with therapies and anticipate will make good progress. He would  benefit from continued OT services while admitted to improve independence with ADL and functional mobility in preparation for return home with 24 hour assistance and home health OT follow-up.     Follow Up Recommendations  Home health OT;Supervision/Assistance - 24 hour    Equipment Recommendations  None recommended by OT (Has equipment needs met)    Recommendations for Other Services       Precautions / Restrictions Precautions Precautions: Fall Restrictions Weight Bearing Restrictions: No      Mobility Bed Mobility Overal bed mobility: Needs Assistance Bed Mobility: Rolling;Sidelying to Sit;Sit to Sidelying Rolling: Min assist Sidelying to sit: Mod assist     Sit to sidelying: Mod assist General bed mobility comments: Mod assist with VC's to complete with roll technique as pt attempting to long sit initially.   Transfers Overall transfer level: Needs assistance Equipment used: 1 person hand held assist Transfers: Stand Pivot Transfers Sit to Stand: Min assist Stand pivot transfers: Min assist       General transfer comment: Min assist to power up and stabilize once standing.     Balance Overall balance assessment: Needs assistance Sitting-balance support: Feet supported;Single extremity supported Sitting balance-Leahy Scale: Poor Sitting balance - Comments: relies on UE support   Standing balance support: During functional activity;Single extremity supported Standing balance-Leahy Scale: Poor Standing balance comment: relies on UE support for balance                           ADL either performed or assessed with clinical judgement   ADL Overall ADL's : Needs assistance/impaired Eating/Feeding: NPO  Grooming: Supervision/safety;Sitting   Upper Body Bathing: Minimal assistance;Sitting   Lower Body Bathing: Maximal assistance;Sit to/from stand   Upper Body Dressing : Minimal assistance;Sitting   Lower Body Dressing: Maximal assistance;Sit  to/from stand   Toilet Transfer: Minimal assistance;Stand-pivot;Cueing for safety;Cueing for sequencing   Toileting- Clothing Manipulation and Hygiene: Maximal assistance;Sit to/from stand       Functional mobility during ADLs: Minimal assistance (stand-pivot only this session) General ADL Comments: Pt able to complete stand-pivot to [facility] x2 this session. First attempt, pt with no success of bowel movement and assisted to return to bed. Once returned to bed, pt reporting feeling BM coming quickly and assisted back to [facility]. Pt with HR up to 145 with second transfer but quickly down to 120 once seated on BSC. Majority of session pt with HR ranging 90-110. Pt with desat to 80% on 3L O2 via [place]. With encouragement of pursed lip breathing techniques increased to 87% notified RN who is addressing. Pt able to reach sPO2 to 91% after approximately 10 minutes of resting in bed.      Vision Patient Visual Report: No change from baseline Vision Assessment?: No apparent visual deficits     Perception     Praxis      Pertinent Vitals/Pain Pain Assessment: Faces Faces Pain Scale: Hurts even more Pain Location: abdomen, bil LES Pain Descriptors / Indicators: Aching;Grimacing;Guarding Pain Intervention(s): Limited activity within patient's tolerance;Monitored during session;Repositioned;PCA encouraged     Hand Dominance     Extremity/Trunk Assessment Upper Extremity Assessment Upper Extremity Assessment: Generalized weakness   Lower Extremity Assessment Lower Extremity Assessment: Generalized weakness   Cervical / Trunk Assessment Cervical / Trunk Assessment: Normal   Communication Communication Communication: No difficulties   Cognition Arousal/Alertness: Awake/alert Behavior During Therapy: WFL for tasks assessed/performed Overall Cognitive Status: Within Functional Limits for tasks assessed                                     General Comments       Exercises      Shoulder Instructions      Home Living Family/patient expects to be discharged to:: Private residence Living Arrangements: Spouse/significant other Available Help at Discharge: Family;Available 24 hours/day Type of Home: House Home Access: Ramped entrance     Home Layout: One level     Bathroom Shower/Tub: Occupational psychologist: Standard     Home Equipment: Environmental consultant - 2 wheels;Cane - single point;Wheelchair - manual;Crutches;Transport chair;Bedside commode;Shower seat          Prior Functioning/Environment Level of Independence: Independent;Independent with assistive device(s)        Comments: Per PT note, used cane at times. Pt reports independence with ADL.         OT Problem List: Decreased strength;Decreased activity tolerance;Impaired balance (sitting and/or standing);Decreased safety awareness;Decreased knowledge of use of DME or AE;Decreased knowledge of precautions;Pain;Cardiopulmonary status limiting activity      OT Treatment/Interventions: Self-care/ADL training;Therapeutic exercise;Energy conservation;DME and/or AE instruction;Therapeutic activities;Patient/family education;Balance training    OT Goals(Current goals can be found in the care plan section) Acute Rehab OT Goals Patient Stated Goal: to go home OT Goal Formulation: With patient Time For Goal Achievement: 11/08/16 Potential to Achieve Goals: Good ADL Goals Pt Will Perform Grooming: with supervision;standing Pt Will Perform Upper Body Dressing: sitting;with supervision Pt Will Perform Lower Body Dressing: sit to/from stand;with supervision Pt Will Transfer to Toilet:  with supervision;bedside commode;ambulating Pt Will Perform Toileting - Clothing Manipulation and hygiene: with min guard assist;sit to/from stand Additional ADL Goal #1: Pt will complete bed mobility in preparation for ADL participation with overall supervision for safety.   OT Frequency: Min 3X/week   Barriers to D/C:             Co-evaluation              AM-PAC PT "6 Clicks" Daily Activity     Outcome Measure Help from another person eating meals?: Total (NPO) Help from another person taking care of personal grooming?: A Little Help from another person toileting, which includes using toliet, bedpan, or urinal?: A Lot Help from another person bathing (including washing, rinsing, drying)?: A Lot Help from another person to put on and taking off regular upper body clothing?: A Little Help from another person to put on and taking off regular lower body clothing?: A Lot 6 Click Score: 13   End of Session Nurse Communication: Mobility status;Other (comment) (vitals)  Activity Tolerance: Patient tolerated treatment well;Other (comment) (see ADL comments for vital signs) Patient left: in bed;with call bell/phone within reach  OT Visit Diagnosis: Pain;Other abnormalities of gait and mobility (R26.89);Muscle weakness (generalized) (M62.81) Pain - Right/Left:  (bilateral) Pain - part of body: Leg (and abdomen)                Time: 1600-1640 OT Time Calculation (min): 40 min Charges:  OT General Charges $OT Visit: 1 Visit OT Evaluation $OT Eval Moderate Complexity: 1 Mod OT Treatments $Self Care/Home Management : 23-37 mins G-Codes:     Norman Herrlich, MS OTR/L  Pager: [place] A Teretha Chalupa 10/25/2016, 5:01 PM

## 2016-10-25 NOTE — Progress Notes (Signed)
Subjective: Interval History: none.. Comfortable this morning. No flatus. No nausea. Still with incisional soreness. Reports that his feet feel better than they have in years.  Objective: Vital signs in last 24 hours: Temp:  [97.6 F (36.4 C)-100.2 F (37.9 C)] 97.7 F (36.5 C) (09/15 0724) Pulse Rate:  [90-127] 108 (09/15 0700) Resp:  [11-35] 22 (09/15 0700) BP: (91-145)/(64-89) 110/68 (09/15 0600) SpO2:  [90 %-99 %] 93 % (09/15 0700) FiO2 (%):  [40 %] 40 % (09/14 1730)  Intake/Output from previous day: 09/14 0701 - 09/15 0700 In: 2080 [I.V.:1800; NG/GT:230; IV Piggyback:50] Out: 2580 [CWCBJ:6283; Emesis/NG output:1210] Intake/Output this shift: No intake/output data recorded.  Abdomen soft with mild appropriate tenderness. Palpable femoral pulses and incisions healing nicely. Audible Doppler flow in his feet bilaterally. Feet warm with neuro intact.  Lab Results:  Recent Labs  10/24/16 0506 10/24/16 0906  WBC 13.3* 15.0*  HGB 11.7* 11.6*  HCT 34.9* 35.0*  PLT 121* 123*   BMET  Recent Labs  10/23/16 1420 10/24/16 0506 10/24/16 0906  NA 136 133*  --   K 4.0 4.3  --   CL 107 105  --   CO2 21* 24  --   GLUCOSE 182* 164*  --   BUN 13 12  --   CREATININE 0.99 0.89 0.82  CALCIUM 7.6* 7.8*  --     Studies/Results: Dg Chest Port 1 View  Result Date: 10/24/2016 CLINICAL DATA:  Post aortobifemoral bypass surgery EXAM: PORTABLE CHEST 1 VIEW COMPARISON:  Portable exam 1517 hours compared to 10/23/2016 FINDINGS: LEFT subclavian AICD with lead projecting over RIGHT ventricle unchanged. RIGHT jugular Swan-Ganz catheter with tip projecting over bifurcation of main pulmonary artery. Nasogastric tube extends into stomach. Normal heart size and pulmonary vascularity. Atherosclerotic calcification and tortuosity of thoracic aorta. Central peribronchial thickening without infiltrate, pleural effusion or pneumothorax. Bones demineralized. IMPRESSION: Bronchitic changes without acute  infiltrate. Electronically Signed   By: Lavonia Dana M.D.   On: 10/24/2016 07:43   Dg Chest Port 1 View  Result Date: 10/23/2016 CLINICAL DATA:  Status post aortobifemoral bypass graft. EXAM: PORTABLE CHEST 1 VIEW COMPARISON:  Radiographs of April 02, 2010. FINDINGS: Stable cardiomediastinal silhouette. No pneumothorax or pleural effusion is noted. Left-sided pacemaker is unchanged in position. Nasogastric tube is seen entering stomach. Right internal jugular Swan-Ganz catheter is noted with tip directed into right pulmonary artery. Bony thorax is unremarkable. No acute pulmonary disease is noted. IMPRESSION: No acute cardiopulmonary abnormality seen. Electronically Signed   By: Marijo Conception, M.D.   On: 10/23/2016 14:55   Dg Abd Portable 1v  Result Date: 10/24/2016 CLINICAL DATA:  NG tube placement . EXAM: PORTABLE ABDOMEN - 1 VIEW COMPARISON:  10/23/2016 . FINDINGS: NG tube noted with tip below left hemidiaphragm. Surgical clip right upper quadrant. No bowel distention. Stool noted throughout the colon. No acute bony abnormality . IMPRESSION: 1. NG tube noted with tip below left hemidiaphragm. 2. No acute intra-abdominal abnormality. Electronically Signed   By: Marcello Moores  Register   On: 10/24/2016 09:07   Dg Abd Portable 1v  Result Date: 10/23/2016 CLINICAL DATA:  Aortobifemoral bypass. EXAM: PORTABLE ABDOMEN - 1 VIEW COMPARISON:  CT 09/12/2016 . FINDINGS: NG tube no with its tip in the stomach. Side hole is at the gastroesophageal junction. Advancement of approximately 8 cm should be considered. Surgical clips right upper quadrant and pelvis. Air-filled loops of nondilated small bowel noted. Colonic gas pattern normal. Stool in the colon. No free air. Aortoiliac atherosclerotic  vascular disease. Reference is made to prior recent CT report. Soft tissue air noted about both groins most likely related to recent procedure. IMPRESSION: 1. Nondilated air-filled loops of small bowel noted. Colonic gas  pattern is normal. Stool in the colon. 2. Aortoiliac atherosclerotic vascular disease. reference is made to prior CT report 09/22/2016. Electronically Signed   By: Marcello Moores  Register   On: 10/23/2016 14:59   Anti-infectives: Anti-infectives    Start     Dose/Rate Route Frequency Ordered Stop   10/23/16 2200  cefUROXime (ZINACEF) 1.5 g in dextrose 5 % 50 mL IVPB     1.5 g 100 mL/hr over 30 Minutes Intravenous Every 12 hours 10/23/16 1700 10/24/16 1052   10/23/16 1230  cefUROXime (ZINACEF) 1.5 g in dextrose 5 % 50 mL IVPB  Status:  Discontinued     1.5 g 100 mL/hr over 30 Minutes Intravenous  Once 10/23/16 1218 10/23/16 1457   10/23/16 0513  cefUROXime (ZINACEF) 1.5 g in dextrose 5 % 50 mL IVPB     1.5 g 100 mL/hr over 30 Minutes Intravenous 30 min pre-op 10/23/16 0513 10/24/16 1200      Assessment/Plan: s/p Procedure(s): AORTA BIFEMORAL BYPASS GRAFT USING HEMASHIELD GOLD (Bilateral) Stable postop day 2 from aortobifemoral bypass grafting. Continue to put up a large amount from his NG tube with 1200 cc over the last 24 hours. Will transfer to step down unit. Dulcolax suppository. DC a line and Foley. Mobilized. Hopefully can DC NG and a.m.   LOS: 2 days   Curt Jews 10/25/2016, 10:21 AM

## 2016-10-26 LAB — BPAM RBC
BLOOD PRODUCT EXPIRATION DATE: 201810052359
Blood Product Expiration Date: 201810062359
Blood Product Expiration Date: 201810092359
Blood Product Expiration Date: 201810092359
ISSUE DATE / TIME: 201809130717
ISSUE DATE / TIME: 201809130717
ISSUE DATE / TIME: 201809141146
ISSUE DATE / TIME: 201809141419
UNIT TYPE AND RH: 5100
UNIT TYPE AND RH: 5100
Unit Type and Rh: 5100
Unit Type and Rh: 5100

## 2016-10-26 LAB — TYPE AND SCREEN
ABO/RH(D): O POS
Antibody Screen: NEGATIVE
UNIT DIVISION: 0
UNIT DIVISION: 0
Unit division: 0
Unit division: 0

## 2016-10-26 NOTE — Progress Notes (Signed)
Notified Dr. Donnetta Hutching of increased fluid from NGT, ok'd to keep NGT in.

## 2016-10-26 NOTE — Progress Notes (Signed)
Subjective: Interval History: none.. Abdomen sore but more comfortable. Small bowel movement yesterday. Reports that he is hungry.  Objective: Vital signs in last 24 hours: Temp:  [97.9 F (36.6 C)-98.9 F (37.2 C)] 98.3 F (36.8 C) (09/16 0845) Pulse Rate:  [80-108] 108 (09/16 0845) Resp:  [12-26] 21 (09/16 1221) BP: (99-122)/(58-87) 100/72 (09/16 0845) SpO2:  [90 %-100 %] 99 % (09/16 1221)  Intake/Output from previous day: 09/15 0701 - 09/16 0700 In: 1492.5 [I.V.:1402.5; NG/GT:90] Out: 2325 [Urine:1325; Emesis/NG output:1000] Intake/Output this shift: Total I/O In: 0  Out: 375 [Urine:325; Emesis/NG output:50]  Abdomen and both groin wounds or stable. Palpable femoral pulses.  Lab Results:  Recent Labs  10/24/16 0506 10/24/16 0906  WBC 13.3* 15.0*  HGB 11.7* 11.6*  HCT 34.9* 35.0*  PLT 121* 123*   BMET  Recent Labs  10/23/16 1420 10/24/16 0506 10/24/16 0906  NA 136 133*  --   K 4.0 4.3  --   CL 107 105  --   CO2 21* 24  --   GLUCOSE 182* 164*  --   BUN 13 12  --   CREATININE 0.99 0.89 0.82  CALCIUM 7.6* 7.8*  --     Studies/Results: Dg Chest Port 1 View  Result Date: 10/24/2016 CLINICAL DATA:  Post aortobifemoral bypass surgery EXAM: PORTABLE CHEST 1 VIEW COMPARISON:  Portable exam 1610 hours compared to 10/23/2016 FINDINGS: LEFT subclavian AICD with lead projecting over RIGHT ventricle unchanged. RIGHT jugular Swan-Ganz catheter with tip projecting over bifurcation of main pulmonary artery. Nasogastric tube extends into stomach. Normal heart size and pulmonary vascularity. Atherosclerotic calcification and tortuosity of thoracic aorta. Central peribronchial thickening without infiltrate, pleural effusion or pneumothorax. Bones demineralized. IMPRESSION: Bronchitic changes without acute infiltrate. Electronically Signed   By: Lavonia Dana M.D.   On: 10/24/2016 07:43   Dg Chest Port 1 View  Result Date: 10/23/2016 CLINICAL DATA:  Status post aortobifemoral  bypass graft. EXAM: PORTABLE CHEST 1 VIEW COMPARISON:  Radiographs of April 02, 2010. FINDINGS: Stable cardiomediastinal silhouette. No pneumothorax or pleural effusion is noted. Left-sided pacemaker is unchanged in position. Nasogastric tube is seen entering stomach. Right internal jugular Swan-Ganz catheter is noted with tip directed into right pulmonary artery. Bony thorax is unremarkable. No acute pulmonary disease is noted. IMPRESSION: No acute cardiopulmonary abnormality seen. Electronically Signed   By: Marijo Conception, M.D.   On: 10/23/2016 14:55   Dg Abd Portable 1v  Result Date: 10/24/2016 CLINICAL DATA:  NG tube placement . EXAM: PORTABLE ABDOMEN - 1 VIEW COMPARISON:  10/23/2016 . FINDINGS: NG tube noted with tip below left hemidiaphragm. Surgical clip right upper quadrant. No bowel distention. Stool noted throughout the colon. No acute bony abnormality . IMPRESSION: 1. NG tube noted with tip below left hemidiaphragm. 2. No acute intra-abdominal abnormality. Electronically Signed   By: Marcello Moores  Register   On: 10/24/2016 09:07   Dg Abd Portable 1v  Result Date: 10/23/2016 CLINICAL DATA:  Aortobifemoral bypass. EXAM: PORTABLE ABDOMEN - 1 VIEW COMPARISON:  CT 09/12/2016 . FINDINGS: NG tube no with its tip in the stomach. Side hole is at the gastroesophageal junction. Advancement of approximately 8 cm should be considered. Surgical clips right upper quadrant and pelvis. Air-filled loops of nondilated small bowel noted. Colonic gas pattern normal. Stool in the colon. No free air. Aortoiliac atherosclerotic vascular disease. Reference is made to prior recent CT report. Soft tissue air noted about both groins most likely related to recent procedure. IMPRESSION: 1. Nondilated air-filled loops  of small bowel noted. Colonic gas pattern is normal. Stool in the colon. 2. Aortoiliac atherosclerotic vascular disease. reference is made to prior CT report 09/22/2016. Electronically Signed   By: Marcello Moores  Register    On: 10/23/2016 14:59   Anti-infectives: Anti-infectives    Start     Dose/Rate Route Frequency Ordered Stop   10/23/16 2200  cefUROXime (ZINACEF) 1.5 g in dextrose 5 % 50 mL IVPB     1.5 g 100 mL/hr over 30 Minutes Intravenous Every 12 hours 10/23/16 1700 10/24/16 1052   10/23/16 1230  cefUROXime (ZINACEF) 1.5 g in dextrose 5 % 50 mL IVPB  Status:  Discontinued     1.5 g 100 mL/hr over 30 Minutes Intravenous  Once 10/23/16 1218 10/23/16 1457   10/23/16 0513  cefUROXime (ZINACEF) 1.5 g in dextrose 5 % 50 mL IVPB     1.5 g 100 mL/hr over 30 Minutes Intravenous 30 min pre-op 10/23/16 0513 10/24/16 1200      Assessment/Plan: s/p Procedure(s): AORTA BIFEMORAL BYPASS GRAFT USING HEMASHIELD GOLD (Bilateral) Stable postop day 3. Had 600 mL NG output from 7 PM to 7 AM but is only had 50 mL in the last 5 hours. Will DC NG tube and mobilized. Voiding with Foley out   LOS: 3 days   [name] 10/26/2016, 1:03 PM

## 2016-10-26 NOTE — Progress Notes (Signed)
Patient NGT was being suctioned on regular suction instead of low intermittent suction. Changed to intermittent and watching.

## 2016-10-27 ENCOUNTER — Inpatient Hospital Stay (HOSPITAL_COMMUNITY): Payer: Medicare Other

## 2016-10-27 LAB — URINALYSIS, COMPLETE (UACMP) WITH MICROSCOPIC
Bacteria, UA: NONE SEEN
Bilirubin Urine: NEGATIVE
GLUCOSE, UA: NEGATIVE mg/dL
HGB URINE DIPSTICK: NEGATIVE
KETONES UR: 20 mg/dL — AB
Leukocytes, UA: NEGATIVE
NITRITE: NEGATIVE
PH: 7 (ref 5.0–8.0)
Protein, ur: 30 mg/dL — AB
Specific Gravity, Urine: 1.016 (ref 1.005–1.030)
Squamous Epithelial / LPF: NONE SEEN

## 2016-10-27 NOTE — Care Management Important Message (Signed)
Important Message  Patient Details  Name: [name] MRN: [id number] Date of Birth: 06/14/1955   Medicare Important Message Given:  Yes    Nathen May 10/27/2016, 10:53 AM

## 2016-10-27 NOTE — Progress Notes (Signed)
Occupational Therapy Treatment Patient Details Name: [name] MRN: [id number] DOB: April 20, 1955 Today's Date: 10/27/2016    History of present illness 61 year old male with a history of right lower extremity rest pain and also has ulceration along the first metatarsal phalangeal joint. He recently underwent angiogram with 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 He underwent CT scan which demonstrated a 4.4 cm aneurysm as well as the occlusive disease in the bilateral common external iliac arteries as well as occlusive disease in the bilateral common femoral arteries. He was therefore indicated for aortobifemoral bypass.    OT comments  Pt limited by pain this pm, and tolerated in room standing activities only.  Transferred to chair with min A.  Pain 7-8/10.  He requires max A for LB ADLs secondary to pain.  Will continue to follow.  VSS.   Follow Up Recommendations  Home health OT;Supervision/Assistance - 24 hour    Equipment Recommendations  None recommended by OT    Recommendations for Other Services      Precautions / Restrictions Precautions Precautions: Fall       Mobility Bed Mobility Overal bed mobility: Needs Assistance Bed Mobility: Rolling;Sidelying to Sit Rolling: Min guard Sidelying to sit: Min guard       General bed mobility comments: verbal cues for technique   Transfers Overall transfer level: Needs assistance Equipment used: Rolling walker (2 wheeled) Transfers: Sit to/from [facility] Sit to Stand: Min assist Stand pivot transfers: Min guard       General transfer comment: assist to steady     Balance Overall balance assessment: Needs  assistance Sitting-balance support: Feet supported;Single extremity supported Sitting balance-Leahy Scale: Fair Sitting balance - Comments: relies on UE support   Standing balance support: During functional activity;Single extremity supported Standing balance-Leahy Scale: Poor Standing balance comment: reliant on UE support                            ADL either performed or assessed with clinical judgement   ADL Overall ADL's : Needs assistance/impaired Eating/Feeding: NPO                   Lower Body Dressing: Maximal assistance;Sit to/from stand Lower Body Dressing Details (indicate cue type and reason): Pt unable to access feet due to increased pain  Toilet Transfer: Minimal assistance;Stand-pivot;BSC;Grab bars;RW   Toileting- Clothing Manipulation and Hygiene: Moderate assistance;Sit to/from stand       Functional mobility during ADLs: Minimal assistance;Rolling walker       Vision       Perception     Praxis      Cognition Arousal/Alertness: Awake/alert Behavior During Therapy: WFL for tasks assessed/performed Overall Cognitive Status: Within Functional Limits for tasks assessed                                          Exercises     Shoulder Instructions       General Comments VSS     Pertinent Vitals/ Pain       Pain Assessment: 0-10 Pain Score: 7  Faces Pain Scale: Hurts little more Pain Location: abdomen  Pain Descriptors / Indicators: Grimacing;Guarding Pain Intervention(s): Monitored during session;PCA encouraged;Limited activity within patient's tolerance;Repositioned  Home Living                                          Prior Functioning/Environment              Frequency  Min 3X/week        Progress Toward Goals  OT Goals(current goals can now be found in the care plan section)  Progress towards OT goals: Progressing toward goals  Acute Rehab OT Goals Patient Stated Goal:  to go home  Plan Discharge plan remains appropriate    Co-evaluation                 AM-PAC PT "6 Clicks" Daily Activity     Outcome Measure   Help from another person eating meals?: Total Help from another person taking care of personal grooming?: A Little Help from another person toileting, which includes using toliet, bedpan, or urinal?: A Lot Help from another person bathing (including washing, rinsing, drying)?: A Lot Help from another person to put on and taking off regular upper body clothing?: A Little Help from another person to put on and taking off regular lower body clothing?: A Lot 6 Click Score: 13    End of Session Equipment Utilized During Treatment: Rolling walker  OT Visit Diagnosis: Pain;Other abnormalities of gait and mobility (R26.89);Muscle weakness (generalized) (M62.81) Pain - part of body:  (abdomen )   Activity Tolerance Patient limited by pain   Patient Left in chair;with call bell/phone within reach   Nurse Communication Mobility status        Time: 5364-6803 OT Time Calculation (min): 20 min  Charges: OT General Charges $OT Visit: 1 Visit OT Treatments $Therapeutic Activity: 8-22 mins  [facility], OTR/L [phone]    Lucille Passy M 10/27/2016, 6:34 PM

## 2016-10-27 NOTE — Progress Notes (Signed)
Physical Therapy Treatment Patient Details Name: [name] MRN: [id number] DOB: 12-29-1955 Today's Date: 10/27/2016    History of Present Illness 61 year old male with a history of right lower extremity rest pain and also has ulceration along the first metatarsal phalangeal joint. He recently underwent angiogram with 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 He underwent CT scan which demonstrated a 4.4 cm aneurysm as well as the occlusive disease in the bilateral common external iliac arteries as well as occlusive disease in the bilateral common femoral arteries. He was therefore indicated for aortobifemoral bypass.     PT Comments    Patient required increased time for mobility due to drowsiness upon arrival and c/o pain "all over". Pt was able to ambulate 139ft with min A and RW. Pt was limited by fatigue and pain and was preoccupied with use of PCA throughout session. Pt with HR up to 140 with mobility. Continue to progress as tolerated.   Follow Up Recommendations  Home health PT;Supervision/Assistance - 24 hour     Equipment Recommendations  None recommended by PT    Recommendations for Other Services       Precautions / Restrictions Precautions Precautions: Fall    Mobility  Bed Mobility Overal bed mobility: Needs Assistance Bed Mobility: Rolling;Sidelying to Sit;Sit to Sidelying Rolling: Min guard Sidelying to sit: Min assist       General bed mobility comments: assist to elevate trunk into sitting; cues for sequencing and use of rail  Transfers Overall transfer level: Needs assistance Equipment used: Rolling walker (2 wheeled) Transfers: Sit to/from Stand Sit to Stand: Min  assist         General transfer comment: assist to power up into standing and to steady while transitioning hand placement  Ambulation/Gait Ambulation/Gait assistance: Min assist Ambulation Distance (Feet): 100 Feet Assistive device: Rolling walker (2 wheeled) Gait Pattern/deviations: Step-through pattern;Decreased stride length;Drifts right/left;Trunk flexed Gait velocity: decreased   General Gait Details: cues for posture and proximity of RW; pt unsteady at times with assist needed for balance and management of RW; HR up to 140 with mobility    Stairs            Wheelchair Mobility    Modified Rankin (Stroke Patients Only)       Balance Overall balance assessment: Needs assistance Sitting-balance support: Feet supported;Single extremity supported Sitting balance-Leahy Scale: Poor Sitting balance - Comments: relies on UE support   Standing balance support: During functional activity;Single extremity supported Standing balance-Leahy Scale: Poor Standing balance comment: relies on UE support for balance                            Cognition Arousal/Alertness: Awake/alert (drowsy ) Behavior During Therapy: WFL for tasks assessed/performed Overall Cognitive Status: Within Functional Limits for tasks assessed                                        Exercises      General Comments        Pertinent Vitals/Pain Pain Assessment: Faces Faces Pain Scale: Hurts little more Pain Location: "all over"  Pain Descriptors / Indicators: Grimacing;Guarding;Aching Pain Intervention(s): Limited activity within patient's tolerance;Monitored during session;Premedicated before session;Repositioned (pt used PCA several times during session)    Home Living                      Prior Function            PT Goals (current goals can now be found in the care plan section) Acute Rehab PT Goals Patient Stated Goal: to go home PT Goal  Formulation: With patient Time For Goal Achievement: 11/07/16 Potential to Achieve Goals: Good Progress towards PT goals: Progressing toward goals    Frequency    Min 3X/week      PT Plan Current plan remains appropriate    Co-evaluation              AM-PAC PT "6 Clicks" Daily Activity  Outcome Measure  Difficulty turning over in bed (including adjusting bedclothes, sheets and blankets)?: A Lot Difficulty moving from lying on back to sitting on the side of the bed? : Unable Difficulty sitting down on and standing up from a chair with arms (e.g., wheelchair, bedside commode, etc,.)?: Unable Help needed moving to and from a bed to chair (including a wheelchair)?: A Lot Help needed walking in hospital room?: A Lot Help needed climbing 3-5 steps with a railing? : A Lot 6 Click Score: 10    End of Session Equipment Utilized During Treatment: Gait belt;Oxygen Activity Tolerance: Patient limited by fatigue;Patient limited by pain Patient left: in chair;with call bell/phone within reach;with family/visitor present Nurse Communication: Mobility status PT Visit Diagnosis: Unsteadiness on feet (R26.81);Muscle weakness (generalized) (M62.81)     Time: 1443-1540 PT Time Calculation (min) (ACUTE ONLY): 42 min  Charges:  $Gait Training: 8-22 mins $Therapeutic Activity: 8-22 mins                    G Codes:       Earney Navy, PTA Pager: [phone]     Darliss Cheney 10/27/2016, 3:30 PM

## 2016-10-27 NOTE — Progress Notes (Addendum)
[facility] Progress Note    10/27/2016 7:42 AM 4 Days Post-Op  Subjective:  C/o sore throat; denies shortness of breath or chest pain.  C/o burning while peeing.  Tm 99.2 HR  81'O-175'Z systolic (Afib recorded at 5am and back in NSR) 02'H-852'D systolic 78% 2UM3NT  On PCA Fluids:  75cc/hr  Vitals:   10/27/16 0000 10/27/16 0400  BP: 99/74 134/87  Pulse: 100 100  Resp: 17 20  Temp: 98.8 F (37.1 C) 99.2 F (37.3 C)  SpO2: 99% 97%    Physical Exam: Cardiac:  regular Lungs:  Clear at bases bilaterally Abdomen:  Soft, NT/ND; occasional bowel sounds; denies flatus Incisions:  Midline and bilateral groin incisions are clean and dry Extremities:  +palpable DP pulse on the right; unable to palpate on the left but foot is warm.  CBC    Component Value Date/Time   WBC 15.0 (H) 10/24/2016 0906   RBC 4.05 (L) 10/24/2016 0906   HGB 11.6 (L) 10/24/2016 0906   HGB 14.8 09/10/2016 1406   HCT 35.0 (L) 10/24/2016 0906   HCT 43.7 09/10/2016 1406   PLT 123 (L) 10/24/2016 0906   PLT 181 09/10/2016 1406   MCV 86.4 10/24/2016 0906   MCV 87 09/10/2016 1406   MCH 28.6 10/24/2016 0906   MCHC 33.1 10/24/2016 0906   RDW 13.5 10/24/2016 0906   RDW 14.3 09/10/2016 1406   LYMPHSABS 1.3 03/27/2010 1124   MONOABS 0.8 03/27/2010 1124   EOSABS 0.4 03/27/2010 1124   BASOSABS 0.0 03/27/2010 1124    BMET    Component Value Date/Time   NA 133 (L) 10/24/2016 0506   NA 140 09/10/2016 1406   K 4.3 10/24/2016 0506   CL 105 10/24/2016 0506   CO2 24 10/24/2016 0506   GLUCOSE 164 (H) 10/24/2016 0506   BUN 12 10/24/2016 0506   BUN 10 09/10/2016 1406   CREATININE 0.82 10/24/2016 0906   CREATININE 0.88 05/17/2014 0819   CALCIUM 7.8 (L) 10/24/2016 0506   GFRNONAA >60 10/24/2016 0906   GFRNONAA >89 05/17/2014 0819   GFRAA >60 10/24/2016 0906   GFRAA >89 05/17/2014 0819    INR    Component Value Date/Time   INR 1.18 10/23/2016 1420     Intake/Output Summary (Last 24 hours) at 10/27/16  0742 Last data filed at 10/27/16 0729  Gross per 24 hour  Intake              620 ml  Output             1950 ml  Net            -1330 ml   NGT output:  850/24hr  (450cc/last shift)  Assessment/Plan:  61 y.o. male is s/p  Aortobifemoral bypass grafting 4 Days Post-Op  -pt c/o sore throat this morning-he does not have any nausea/vomiting.   -he appears mildly distressed this morning, but denies any shortness of breath, chest pain-only complaint is sore throat and burning with voiding.  WBC up slightly a couple of days ago and has mild fever this am-will check u/a and encourage IS every hour.  He is doing well with the IS. -one episode of transient afib recorded earlier this morning and back to NSR now.  Pt is on SQ heparin for DVT prophylaxis.   -pt using PCA for pain control   [name], PA-C [facility] [phone] 10/27/2016 7:42 AM   I have independently interviewed and examined patient and agree with PA  assessment and plan above. Physical exam, vitals and labs unremarkable but he is generally not feeling well. His feet are better then ever but abdomen causing him significant discomfort. Will get kub and keep ng tube for now. Encouraged oob as tolerates.  Narada Uzzle C. Donzetta Matters, MD [facility] [place] Office: [phone] Pager: [phone]

## 2016-10-27 NOTE — Progress Notes (Signed)
IJ removed per order and per protocol. Pt educated on need to stay flat in bed x3m. Call bell and phone within reach.

## 2016-10-28 LAB — CBC WITH DIFFERENTIAL/PLATELET
BASOS ABS: 0 10*3/uL (ref 0.0–0.1)
Basophils Relative: 0 %
Eosinophils Absolute: 0.5 10*3/uL (ref 0.0–0.7)
Eosinophils Relative: 8 %
HEMATOCRIT: 27.3 % — AB (ref 39.0–52.0)
Hemoglobin: 9.2 g/dL — ABNORMAL LOW (ref 13.0–17.0)
LYMPHS PCT: 16 %
Lymphs Abs: 1.2 10*3/uL (ref 0.7–4.0)
MCH: 29 pg (ref 26.0–34.0)
MCHC: 33.7 g/dL (ref 30.0–36.0)
MCV: 86.1 fL (ref 78.0–100.0)
MONO ABS: 0.6 10*3/uL (ref 0.1–1.0)
Monocytes Relative: 9 %
NEUTROS PCT: 67 %
Neutro Abs: 4.8 10*3/uL (ref 1.7–7.7)
Platelets: 149 10*3/uL — ABNORMAL LOW (ref 150–400)
RBC: 3.17 MIL/uL — AB (ref 4.22–5.81)
RDW: 13.1 % (ref 11.5–15.5)
WBC: 7.2 10*3/uL (ref 4.0–10.5)

## 2016-10-28 LAB — BASIC METABOLIC PANEL
ANION GAP: 11 (ref 5–15)
BUN: 7 mg/dL (ref 6–20)
CO2: 24 mmol/L (ref 22–32)
Calcium: 7.9 mg/dL — ABNORMAL LOW (ref 8.9–10.3)
Chloride: 100 mmol/L — ABNORMAL LOW (ref 101–111)
Creatinine, Ser: 0.7 mg/dL (ref 0.61–1.24)
GFR calc Af Amer: >60 mL/min (ref >60)
GLUCOSE: 123 mg/dL — AB (ref 65–99)
POTASSIUM: 3.1 mmol/L — AB (ref 3.5–5.1)
Sodium: 135 mmol/L (ref 135–145)

## 2016-10-28 MED ORDER — KCL IN DEXTROSE-NACL 20-5-0.45 MEQ/L-%-% IV SOLN
INTRAVENOUS | Status: DC
  Administered 2016-10-28 – 2016-10-29 (×3): via INTRAVENOUS
  Filled 2016-10-28 (×4): qty 1000

## 2016-10-28 MED ORDER — MAGNESIUM SULFATE 2 GM/50ML IV SOLN
2.0000 g | Freq: Once | INTRAVENOUS | Status: AC
  Administered 2016-10-28: 2 g via INTRAVENOUS
  Filled 2016-10-28: qty 50

## 2016-10-28 NOTE — Progress Notes (Addendum)
Progress Note    10/28/2016 7:31 AM 5 Days Post-Op  Subjective:  Says he feels a little better this morning.  Walked in the halls yesterday. Says last BM was Saturday.  Says his feet feel good.  Afebrile HR 80's-110's NSR/ST one episode of 140's early this am 478'G-956'O systolic 13% RA  Vitals:   10/28/16 0350 10/28/16 0615  BP: 105/76 105/69  Pulse: 97 97  Resp: 20 18  Temp: 98.2 F (36.8 C)   SpO2: 93% 96%    Physical Exam: Cardiac:  regular Lungs:  Non labored Incisions:  Midline and bilateral groins are healing nicely Extremities:  Bilateral feet are warm Abdomen:  Soft, NT/ND; +BS; -BM since Saturday.  CBC    Component Value Date/Time   WBC 7.2 10/28/2016 0339   RBC 3.17 (L) 10/28/2016 0339   HGB 9.2 (L) 10/28/2016 0339   HGB 14.8 09/10/2016 1406   HCT 27.3 (L) 10/28/2016 0339   HCT 43.7 09/10/2016 1406   PLT 149 (L) 10/28/2016 0339   PLT 181 09/10/2016 1406   MCV 86.1 10/28/2016 0339   MCV 87 09/10/2016 1406   MCH 29.0 10/28/2016 0339   MCHC 33.7 10/28/2016 0339   RDW 13.1 10/28/2016 0339   RDW 14.3 09/10/2016 1406   LYMPHSABS 1.2 10/28/2016 0339   MONOABS 0.6 10/28/2016 0339   EOSABS 0.5 10/28/2016 0339   BASOSABS 0.0 10/28/2016 0339    BMET    Component Value Date/Time   NA 135 10/28/2016 0339   NA 140 09/10/2016 1406   K 3.1 (L) 10/28/2016 0339   CL 100 (L) 10/28/2016 0339   CO2 24 10/28/2016 0339   GLUCOSE 123 (H) 10/28/2016 0339   BUN 7 10/28/2016 0339   BUN 10 09/10/2016 1406   CREATININE 0.70 10/28/2016 0339   CREATININE 0.88 05/17/2014 0819   CALCIUM 7.9 (L) 10/28/2016 0339   GFRNONAA >60 10/28/2016 0339   GFRNONAA >89 05/17/2014 0819   GFRAA >60 10/28/2016 0339   GFRAA >89 05/17/2014 0819    INR    Component Value Date/Time   INR 1.18 10/23/2016 1420     Intake/Output Summary (Last 24 hours) at 10/28/16 0731 Last data filed at 10/28/16 0600  Gross per 24 hour  Intake          1709.23 ml  Output             1425 ml    Net           284.23 ml    NGT output:  1000cc/24hr  (container in room is less than a day since being changed)  [name] is s/p:  Aortobifemoral bypass grafting  5 Days Post-Op  Plan: -pt states he is feeling better this am and appears comfortable this am.  Leukocytosis has improved and he is afebrile.  He is ambulating in the hallways; u/a without UTI -continues to have increased NGT output-KUB is relatively unchanged from previous.  His abdomen is soft and non tender to palpation.  -BM since Saturday.   -if pt continues to be npo-will need nutrition consult.  -DVT prophylaxis:  SQ heparin -central line removed yesterday.   [name], PA-C [facility] [phone] 10/28/2016 7:31 AM   I have independently interviewed and examined patient and agree with PA assessment and plan above. Changed ivf to add potassium. Clamp ng tube this morning and possibly remove this pm if tolerated. Continue to ambulate in halls.  Aliou Mealey C. Donzetta Matters, MD  [facility] [place] Office: [phone] Pager: [phone]

## 2016-10-28 NOTE — Progress Notes (Signed)
   Ng tube was clamped for 4 hours and hooked to suction at 12p with minimal output. Discontinued ng tube. Ok for minimal clears and ice chips. He understands that nausea/vomiting will necessitate replacement of ng tube.   Cesario Weidinger C. Donzetta Matters, MD [facility] [place] Office: [phone] Pager: [phone]

## 2016-10-29 MED ORDER — WARFARIN SODIUM 7.5 MG PO TABS
7.5000 mg | ORAL_TABLET | Freq: Once | ORAL | Status: AC
  Administered 2016-10-29: 7.5 mg via ORAL
  Filled 2016-10-29: qty 1

## 2016-10-29 MED ORDER — WARFARIN - PHARMACIST DOSING INPATIENT
Freq: Every day | Status: DC
  Administered 2016-10-29 – 2016-11-01 (×2)

## 2016-10-29 MED ORDER — ENOXAPARIN SODIUM 80 MG/0.8ML ~~LOC~~ SOLN
80.0000 mg | Freq: Two times a day (BID) | SUBCUTANEOUS | Status: DC
  Administered 2016-10-29 – 2016-10-31 (×6): 80 mg via SUBCUTANEOUS
  Filled 2016-10-29 (×6): qty 0.8

## 2016-10-29 NOTE — Progress Notes (Addendum)
  Progress Note    10/29/2016 8:32 AM 6 Days Post-Op  Subjective:  Feels a little better; denies N/V  Afebrile VSS  Vitals:   10/29/16 0700 10/29/16 0800  BP: 113/72   Pulse: 92   Resp:  18  Temp:    SpO2: 100% 95%    Physical Exam: Cardiac:  regular Lungs:  Non labored Incisions:  Healing nicely Extremities:  Bilateral feet are warm Abdomen:  Soft, NT/ND; +BS; +flatus  CBC    Component Value Date/Time   WBC 7.2 10/28/2016 0339   RBC 3.17 (L) 10/28/2016 0339   HGB 9.2 (L) 10/28/2016 0339   HGB 14.8 09/10/2016 1406   HCT 27.3 (L) 10/28/2016 0339   HCT 43.7 09/10/2016 1406   PLT 149 (L) 10/28/2016 0339   PLT 181 09/10/2016 1406   MCV 86.1 10/28/2016 0339   MCV 87 09/10/2016 1406   MCH 29.0 10/28/2016 0339   MCHC 33.7 10/28/2016 0339   RDW 13.1 10/28/2016 0339   RDW 14.3 09/10/2016 1406   LYMPHSABS 1.2 10/28/2016 0339   MONOABS 0.6 10/28/2016 0339   EOSABS 0.5 10/28/2016 0339   BASOSABS 0.0 10/28/2016 0339    BMET    Component Value Date/Time   NA 135 10/28/2016 0339   NA 140 09/10/2016 1406   K 3.1 (L) 10/28/2016 0339   CL 100 (L) 10/28/2016 0339   CO2 24 10/28/2016 0339   GLUCOSE 123 (H) 10/28/2016 0339   BUN 7 10/28/2016 0339   BUN 10 09/10/2016 1406   CREATININE 0.70 10/28/2016 0339   CREATININE 0.88 05/17/2014 0819   CALCIUM 7.9 (L) 10/28/2016 0339   GFRNONAA >60 10/28/2016 0339   GFRNONAA >89 05/17/2014 0819   GFRAA >60 10/28/2016 0339   GFRAA >89 05/17/2014 0819    INR    Component Value Date/Time   INR 1.18 10/23/2016 1420     Intake/Output Summary (Last 24 hours) at 10/29/16 0832 Last data filed at 10/29/16 0800  Gross per 24 hour  Intake             1665 ml  Output              500 ml  Net             1165 ml     [name] is s/p:  Aortobifemoral bypass grafting  6 Days Post-Op  Plan: -pt doing well after NGT removed yesterday without N/V.  Will adv diet to clears -check labs in am -pharmacy consult  for Lovenox/Coumadin for PAF -continue to mobilize in halls and OOB to chair   Trevor Locket, PA-C [facility] [phone] 10/29/2016 8:32 AM  I have independently interviewed and examined patient and agree with PA assessment and plan above. Slow advancement of diet. Restart anticoagulation.  Trevor Dack C. Donzetta Matters, MD [facility] [place] Office: [phone] Pager: [phone]

## 2016-10-29 NOTE — Progress Notes (Signed)
Per insurance check on Lovenox syringes (if needed for discharge)  # 4.  S/W [facility] #  [phone]   1. LOVENOZ 80 MG SYRINGES BID TOTAL OF 14   COVER- NONE FORMULARY  PRIOR APPROVAL- YES [phone] FOR EXCEPTION    2. ENOXAPARIN  80 MG SYRINGES BID    COVER- YES  CO-PAY- $ 95.00  TIER- 4 DRUG  PRIOR APPROVAL- NO   PHARMACY : ANY RETAIL

## 2016-10-29 NOTE — Progress Notes (Signed)
Occupational Therapy Treatment Patient Details Name: [name] MRN: [id number] DOB: 07-31-1955 Today's Date: 10/29/2016    History of present illness 60 year old male with a history of right lower extremity rest pain and also has ulceration along the first metatarsal phalangeal joint. He recently underwent angiogram with 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 He underwent CT scan which demonstrated a 4.4 cm aneurysm as well as the occlusive disease in the bilateral common external iliac arteries as well as occlusive disease in the bilateral common femoral arteries. He was therefore indicated for aortobifemoral bypass.    OT comments  Pt demonstrating good progress toward OT goals this session. He was able to ambulate to the bathroom with min assist and single UE support, complete toileting hygiene, and stand at sink for grooming tasks all with min assist demonstrating significant improvement in activity tolerance. He continues to be easily distracted by lines but noted improved appropriateness of conversation and responses this session. OT will continue to follow while admitted with next session to focus on LB ADL.   Follow Up Recommendations  Home health OT;Supervision/Assistance - 24 hour    Equipment Recommendations  None recommended by OT    Recommendations for Other Services      Precautions / Restrictions Precautions Precautions: Fall Restrictions Weight Bearing Restrictions: No       Mobility Bed Mobility Overal bed mobility: Modified Independent Bed Mobility: Rolling;Sidelying to Sit Rolling: Min guard Sidelying to sit: Min guard       General bed mobility comments: Increased  time and effort.   Transfers Overall transfer level: Needs assistance Equipment used: 1 person hand held assist Transfers: Sit to/from Stand Sit to Stand: Min assist         General transfer comment: MIn assist to power up and steady once standing.     Balance Overall balance assessment: Needs assistance Sitting-balance support: Feet supported Sitting balance-Leahy Scale: Good Sitting balance - Comments: relies on UE support   Standing balance support: During functional activity;Single extremity supported Standing balance-Leahy Scale: Poor Standing balance comment: reliant on UE support                            ADL either performed or assessed with clinical judgement   ADL Overall ADL's : Needs assistance/impaired Eating/Feeding: Set up;Sitting   Grooming: Minimal assistance;Standing                   Toilet Transfer: Minimal assistance;Ambulation;Regular Toilet;Grab bars;Cueing for safety Toilet Transfer Details (indicate cue type and reason): Pt pushing IV pole during session.  Toileting- Clothing Manipulation and Hygiene: Moderate assistance;Sit to/from stand       Functional mobility during ADLs: Minimal assistance;Rolling walker General ADL Comments: Pt with improved activity tolerance this session and was able to ambulate to the bathroom with min assist and pushing IV pole. Noted pt reaching for furniture throughout session.      Vision   Vision Assessment?: No apparent visual deficits   Perception     Praxis      Cognition Arousal/Alertness: Awake/alert Behavior During Therapy: WFL for tasks assessed/performed Overall Cognitive Status: Within Functional Limits for tasks assessed  General Comments: Pt easily distracted by lines and slightly impulsive        Exercises Exercises: General Lower Extremity Total Joint Exercises Ankle Circles/Pumps: AROM;Both;10 reps;Supine Other  Exercises Other Exercises: bilat calf stretches    Shoulder Instructions       General Comments VSS on RA    Pertinent Vitals/ Pain       Pain Assessment: Faces Faces Pain Scale: Hurts even more Pain Location: bilat LE Pain Descriptors / Indicators: Aching Pain Intervention(s): Monitored during session;Repositioned  Home Living                                          Prior Functioning/Environment              Frequency  Min 3X/week        Progress Toward Goals  OT Goals(current goals can now be found in the care plan section)  Progress towards OT goals: Progressing toward goals  Acute Rehab OT Goals Patient Stated Goal: to go home OT Goal Formulation: With patient Time For Goal Achievement: 11/08/16 Potential to Achieve Goals: Good ADL Goals Pt Will Perform Grooming: with supervision;standing Pt Will Perform Upper Body Dressing: sitting;with supervision Pt Will Perform Lower Body Dressing: sit to/from stand;with supervision Pt Will Transfer to Toilet: with supervision;bedside commode;ambulating Pt Will Perform Toileting - Clothing Manipulation and hygiene: with min guard assist;sit to/from stand Additional ADL Goal #1: Pt will complete bed mobility in preparation for ADL participation with overall supervision for safety.   Plan Discharge plan remains appropriate    Co-evaluation                 AM-PAC PT "6 Clicks" Daily Activity     Outcome Measure   Help from another person eating meals?: A Little Help from another person taking care of personal grooming?: A Little Help from another person toileting, which includes using toliet, bedpan, or urinal?: A Little Help from another person bathing (including washing, rinsing, drying)?: A Lot Help from another person to put on and taking off regular upper body clothing?: A Little Help from another person to put on and taking off regular lower body clothing?: A Lot 6 Click Score: 16     End of Session Equipment Utilized During Treatment: Rolling walker  OT Visit Diagnosis: Pain;Other abnormalities of gait and mobility (R26.89);Muscle weakness (generalized) (M62.81) Pain - Right/Left:  (bilateral) Pain - part of body: Leg   Activity Tolerance Patient limited by pain   Patient Left in chair;with call bell/phone within reach   Nurse Communication Mobility status        Time: 1140-1155 OT Time Calculation (min): 15 min  Charges: OT General Charges $OT Visit: 1 Visit OT Treatments $Self Care/Home Management : 8-22 mins  Norman Herrlich, MS OTR/L  Pager: Mil[place] A Kayan Blissett 10/29/2016, 4:28 PM

## 2016-10-29 NOTE — Progress Notes (Signed)
ANTICOAGULATION CONSULT NOTE - Initial Consult  Pharmacy Consult for warfarin/enoxaparin Indication: VTE prophylaxis  No Known Allergies  Patient Measurements: Height: 6\' 1"  (185.4 cm) Weight: 177 lb (80.3 kg) IBW/kg (Calculated) : 79.9  Vital Signs: Temp: 98.6 F (37 C) (09/19 0340) Temp Source: Oral (09/19 0340) BP: 113/72 (09/19 0700) Pulse Rate: 92 (09/19 0700)  Labs:  Recent Labs  10/28/16 0339  HGB 9.2*  HCT 27.3*  PLT 149*  CREATININE 0.70    Estimated Creatinine Clearance: 112.4 mL/min (by C-G formula based on SCr of 0.7 mg/dL).   Medical History: Past Medical History:  Diagnosis Date  . AAA (abdominal aortic aneurysm) ([place])   . AICD (automatic cardioverter/defibrillator) present   . CAD (coronary artery disease)    status post diaphragmatic wall infarction in 1995, treated with percutaneous transluminal coronary  angioplasty with subsequent stenting of the right coronary artery in 2002.   [name] CHF (congestive heart failure) ([place])   . GERD (gastroesophageal reflux disease)   . Hyperlipidemia   . Hypertension    Intolerant to MULTIPLE CHOLESTEROL MEDICATIONS.  [name] Myocardial infarction ([place])   . Neuromuscular disorder ([place])   . PFO (patent foramen ovale)   . Pneumonia   . Presence of permanent cardiac pacemaker   . Sleep apnea   . Status post cholecystectomy   . Stroke [facility])     Medications:  Prescriptions Prior to Admission  Medication Sig Dispense Refill Last Dose  . acetaminophen-codeine (TYLENOL #3) 300-30 MG tablet Take 1 tablet by mouth every 8 (eight) hours as needed (for pain.).   10/22/2016 at Unknown time  . apixaban (ELIQUIS) 5 MG TABS tablet Take 5 mg by mouth 2 (two) times daily.   10/20/2016  . aspirin EC 81 MG tablet Take 1 tablet (81 mg total) by mouth daily. (Patient taking differently: Take 81 mg by mouth every evening. ) 90 tablet 3 10/20/2016  . benazepril (LOTENSIN) 10 MG tablet Take 1 tablet (10 mg total) by mouth daily. (Patient  taking differently: Take 10 mg by mouth every evening. ) 30 tablet 6 10/22/2016 at Unknown time  . magic mouthwash SOLN Take 5 mLs by mouth 4 (four) times daily as needed (for canker sore/mouth pain.).    10/22/2016 at Unknown time  . metoprolol succinate (TOPROL-XL) 50 MG 24 hr tablet Take 25 mg by mouth every evening. Take with or immediately following a meal.    10/22/2016 at Unknown time  . simvastatin (ZOCOR) 40 MG tablet Take 1 tablet (40 mg total) by mouth at bedtime. 30 tablet 6 Past Week at Unknown time  . varenicline (CHANTIX STARTING MONTH PAK) 0.5 MG X 11 & 1 MG X 42 tablet Take one 0.5 mg tablet by mouth once daily for 3 days, then increase to one 0.5 mg tablet twice daily for 4 days, then increase to one 1 mg tablet twice daily. (Patient taking differently: Take 1 mg by mouth 2 (two) times daily. ) 53 tablet 0 Past Week at Unknown time  . amitriptyline (ELAVIL) 25 MG tablet Take 25 mg by mouth at bedtime as needed for sleep.    More than a month at Unknown time  . nitroGLYCERIN (NITROSTAT) 0.4 MG SL tablet Place 1 tablet (0.4 mg total) under the tongue every 5 (five) minutes as needed for chest pain. May repeat 3 times. 25 tablet 3 More than a month at Unknown time   Assessment: [name] is a 61 y.o. male PMH of stroke and atrial fibrillation  on Eliquis PTA, but now switching to warfarin due to copay cost. He is S/P Aortobifemoral bypass grafting/ Left common femoral endarterectomy. HgB 9.2 this morning, PLT 149. Patient received dose of SQ heparin at 0700 this morning. Will bridge with Lovenox until INR therapeutic.  Goal of Therapy:  INR 2-3 Monitor platelets by anticoagulation protocol: Yes   Plan:  Lovenox 80mg  SQ BID Warfarin 7.5mg  QD once Daily INR/CBC Monitor for s/sx of bleeding  Deirdre Gryder L Mikhael Hendriks 10/29/2016,10:01 AM

## 2016-10-29 NOTE — Progress Notes (Signed)
Physical Therapy Treatment Patient Details Name: [name] MRN: [id number] DOB: 1955-03-29 Today's Date: 10/29/2016    History of Present Illness 61 year old male with a history of right lower extremity rest pain and also has ulceration along the first metatarsal phalangeal joint. He recently underwent angiogram with 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 He underwent CT scan which demonstrated a 4.4 cm aneurysm as well as the occlusive disease in the bilateral common external iliac arteries as well as occlusive disease in the bilateral common femoral arteries. He was therefore indicated for aortobifemoral bypass.     PT Comments    Patient is progressing well toward mobility goals and tolerated increased activity this session. Pt reported no increase in pain with mobility. VSS on RA throughout session. Overall min A for OOB mobility and mod I for bed mobility. Current plan remains appropriate.   Follow Up Recommendations  Home health PT;Supervision/Assistance - 24 hour     Equipment Recommendations  None recommended by PT    Recommendations for Other Services       Precautions / Restrictions Precautions Precautions: Fall    Mobility  Bed Mobility Overal bed mobility: Modified Independent             General bed mobility comments: increased time and effort; HOB elevated to when coming into sitting  Transfers Overall transfer level: Needs assistance Equipment used: Rolling walker (2 wheeled) Transfers: Sit to/from [facility] Sit to Stand: Min assist         General transfer comment: assist to power up and to steady upon standing; cues for safe hand  placement  Ambulation/Gait Ambulation/Gait assistance: Min assist Ambulation Distance (Feet): 280 Feet Assistive device: Rolling walker (2 wheeled) Gait Pattern/deviations: Step-through pattern;Decreased stride length;Drifts right/left;Trunk flexed Gait velocity: decreased   General Gait Details: cues for posture and proximity of RW; grossly steady gait with unsteadiness noted with horizontal head turns and directional changes however no LOB   Stairs            Wheelchair Mobility    Modified Rankin (Stroke Patients Only)       Balance Overall balance assessment: Needs assistance Sitting-balance support: Feet supported Sitting balance-Leahy Scale: Good     Standing balance support: During functional activity;Single extremity supported Standing balance-Leahy Scale: Poor Standing balance comment: reliant on UE support                             Cognition Arousal/Alertness: Awake/alert Behavior During Therapy: WFL for tasks assessed/performed Overall Cognitive Status: Within Functional Limits for tasks assessed                                 General Comments: pt is a little impulsive at times and easily distracted by lines      Exercises Total Joint Exercises Ankle Circles/Pumps: AROM;Both;10 reps;Supine Other Exercises Other Exercises: bilat calf stretches     General Comments General comments (skin integrity, edema, etc.): VSS on RA      Pertinent Vitals/Pain Pain Assessment: Faces Faces Pain Scale: Hurts little more Pain Location: bilat LE Pain Descriptors / Indicators: Aching  Pain Intervention(s): Monitored during session;Premedicated before session;Repositioned (PCA used once during session)    Home Living                      Prior Function            PT Goals (current goals can now be found in the care plan section) Acute Rehab PT Goals Patient Stated Goal: to go home PT Goal Formulation: With  patient Time For Goal Achievement: 11/07/16 Potential to Achieve Goals: Good Progress towards PT goals: Progressing toward goals    Frequency    Min 3X/week      PT Plan Current plan remains appropriate    Co-evaluation              AM-PAC PT "6 Clicks" Daily Activity  Outcome Measure  Difficulty turning over in bed (including adjusting bedclothes, sheets and blankets)?: A Lot Difficulty moving from lying on back to sitting on the side of the bed? : A Lot Difficulty sitting down on and standing up from a chair with arms (e.g., wheelchair, bedside commode, etc,.)?: Unable Help needed moving to and from a bed to chair (including a wheelchair)?: A Little Help needed walking in hospital room?: A Little Help needed climbing 3-5 steps with a railing? : A Lot 6 Click Score: 13    End of Session Equipment Utilized During Treatment: Gait belt Activity Tolerance: Patient tolerated treatment well Patient left: with call bell/phone within reach;in bed Nurse Communication: Mobility status PT Visit Diagnosis: Unsteadiness on feet (R26.81);Muscle weakness (generalized) (M62.81)     Time: 0165-5374 PT Time Calculation (min) (ACUTE ONLY): 24 min  Charges:  $Gait Training: 8-22 mins $Therapeutic Activity: 8-22 mins                    G Codes:       Earney Navy, PTA Pager: [phone]     Darliss Cheney 10/29/2016, 4:19 PM

## 2016-10-30 ENCOUNTER — Telehealth: Payer: Self-pay | Admitting: Internal Medicine

## 2016-10-30 LAB — BASIC METABOLIC PANEL
Anion gap: 13 (ref 5–15)
BUN: 6 mg/dL (ref 6–20)
CALCIUM: 7.7 mg/dL — AB (ref 8.9–10.3)
CO2: 19 mmol/L — AB (ref 22–32)
CREATININE: 0.67 mg/dL (ref 0.61–1.24)
Chloride: 102 mmol/L (ref 101–111)
GFR calc Af Amer: >60 mL/min (ref >60)
GLUCOSE: 101 mg/dL — AB (ref 65–99)
Potassium: 3.6 mmol/L (ref 3.5–5.1)
Sodium: 134 mmol/L — ABNORMAL LOW (ref 135–145)

## 2016-10-30 LAB — CBC
HEMATOCRIT: 27.4 % — AB (ref 39.0–52.0)
HEMOGLOBIN: 9 g/dL — AB (ref 13.0–17.0)
MCH: 28.4 pg (ref 26.0–34.0)
MCHC: 32.8 g/dL (ref 30.0–36.0)
MCV: 86.4 fL (ref 78.0–100.0)
PLATELETS: 177 10*3/uL (ref 150–400)
RBC: 3.17 MIL/uL — AB (ref 4.22–5.81)
RDW: 12.9 % (ref 11.5–15.5)
WBC: 6.7 10*3/uL (ref 4.0–10.5)

## 2016-10-30 LAB — PROTIME-INR
INR: 1.26
Prothrombin Time: 15.7 seconds — ABNORMAL HIGH (ref 11.4–15.2)

## 2016-10-30 MED ORDER — METOPROLOL SUCCINATE ER 25 MG PO TB24
25.0000 mg | ORAL_TABLET | Freq: Every evening | ORAL | Status: DC
Start: 2016-10-30 — End: 2016-11-02
  Administered 2016-10-30 – 2016-11-01 (×3): 25 mg via ORAL
  Filled 2016-10-30 (×3): qty 1

## 2016-10-30 MED ORDER — WARFARIN SODIUM 7.5 MG PO TABS
7.5000 mg | ORAL_TABLET | Freq: Once | ORAL | Status: AC
  Administered 2016-10-30: 7.5 mg via ORAL
  Filled 2016-10-30: qty 1

## 2016-10-30 MED ORDER — PATIENT'S GUIDE TO USING COUMADIN BOOK
Freq: Once | Status: AC
  Administered 2016-10-30: 18:00:00
  Filled 2016-10-30: qty 1

## 2016-10-30 MED ORDER — WARFARIN VIDEO
Freq: Once | Status: AC
  Administered 2016-10-30: 18:00:00

## 2016-10-30 MED ORDER — SIMVASTATIN 40 MG PO TABS
40.0000 mg | ORAL_TABLET | Freq: Every day | ORAL | Status: DC
Start: 2016-10-30 — End: 2016-11-02
  Administered 2016-10-30 – 2016-11-01 (×3): 40 mg via ORAL
  Filled 2016-10-30 (×3): qty 1

## 2016-10-30 MED ORDER — ASPIRIN EC 81 MG PO TBEC
81.0000 mg | DELAYED_RELEASE_TABLET | Freq: Every evening | ORAL | Status: DC
  Administered 2016-10-30 – 2016-11-01 (×3): 81 mg via ORAL
  Filled 2016-10-30 (×3): qty 1

## 2016-10-30 MED ORDER — HYDROCODONE-ACETAMINOPHEN 5-325 MG PO TABS
1.0000 | ORAL_TABLET | Freq: Four times a day (QID) | ORAL | Status: DC | PRN
  Administered 2016-10-30 (×3): 2 via ORAL
  Administered 2016-10-31 – 2016-11-01 (×5): 1 via ORAL
  Administered 2016-11-01 – 2016-11-02 (×2): 2 via ORAL
  Filled 2016-10-30 (×2): qty 2
  Filled 2016-10-30: qty 1
  Filled 2016-10-30 (×3): qty 2
  Filled 2016-10-30: qty 1
  Filled 2016-10-30: qty 2
  Filled 2016-10-30: qty 1
  Filled 2016-10-30: qty 2

## 2016-10-30 MED ORDER — BISACODYL 10 MG RE SUPP
10.0000 mg | Freq: Once | RECTAL | Status: AC
  Administered 2016-10-30: 10 mg via RECTAL
  Filled 2016-10-30: qty 1

## 2016-10-30 NOTE — Progress Notes (Signed)
10/30/2016  0900 Pt ambulated down long hall on RA with rolling walker.  HR got as high as 120, Sats were between 95-98%.  Pt tolerated well. [place]

## 2016-10-30 NOTE — Progress Notes (Signed)
ANTICOAGULATION CONSULT NOTE - Follow Up Consult  Pharmacy Consult for Coumadin and Lovenox Indication: atrial fibrillation  No Known Allergies  Patient Measurements: Height: 6\' 1"  (185.4 cm) Weight: 178 lb 14.4 oz (81.1 kg) IBW/kg (Calculated) : 79.9 Lovenox Dosing Weight: 81 kg  Vital Signs: Temp: 97.7 F (36.5 C) (09/20 0805) Temp Source: Axillary (09/20 0805) BP: 108/66 (09/20 0805) Pulse Rate: 84 (09/20 0805)  Labs:  Recent Labs  10/28/16 0339 10/30/16 0328 10/30/16 1039  HGB 9.2* 9.0*  --   HCT 27.3* 27.4*  --   PLT 149* 177  --   LABPROT  --   --  15.7*  INR  --   --  1.26  CREATININE 0.70 0.67  --     Estimated Creatinine Clearance: 112.4 mL/min (by C-G formula based on SCr of 0.67 mg/dL).  Assessment:  Day #2 Lovenox/Coumadin overlap for atrial fibrillation and hx stroke. Previously on Eliquis, but changed to warfarin due to cost. Hgb low stable. Platelet count back into normal range.   INR 1.26 after Coumadin 7.5 mg x 1 on 10/29/16.   Goal of Therapy:  INR 2-3 Anti-Xa level 0.6-1 units/ml 4hrs after LMWH dose given Monitor platelets by anticoagulation protocol: Yes   Plan:   Coumadin 7.5 mg again today.  Lovenox 80 mg sq q12h.  Continue Lovenox until INR >2.  Intermittent CBC while on Lovenox.  Arty Baumgartner, [place] Pager: [phone] 10/30/2016,11:59 AM

## 2016-10-30 NOTE — Progress Notes (Addendum)
  Progress Note    10/30/2016 7:52 AM 7 Days Post-Op  Subjective:  Says he tolerated the diet.  Wants to get unhooked from the wires.  Tm 99.3 now afebrile HR  70's-90's NSR 416'S-063'K systolic 16% RA  Vitals:   10/30/16 0400 10/30/16 0436  BP:  139/90  Pulse:  85  Resp: 18 18  Temp:  97.8 F (36.6 C)  SpO2: 98% 97%    Physical Exam: Cardiac:  regular Lungs:   Non labored Incisions:  All incisions are healing nicely Extremities:  Bilateral feet are warm Abdomen:  Soft, NT/ND; +BS; -BM  CBC    Component Value Date/Time   WBC 6.7 10/30/2016 0328   RBC 3.17 (L) 10/30/2016 0328   HGB 9.0 (L) 10/30/2016 0328   HGB 14.8 09/10/2016 1406   HCT 27.4 (L) 10/30/2016 0328   HCT 43.7 09/10/2016 1406   PLT 177 10/30/2016 0328   PLT 181 09/10/2016 1406   MCV 86.4 10/30/2016 0328   MCV 87 09/10/2016 1406   MCH 28.4 10/30/2016 0328   MCHC 32.8 10/30/2016 0328   RDW 12.9 10/30/2016 0328   RDW 14.3 09/10/2016 1406   LYMPHSABS 1.2 10/28/2016 0339   MONOABS 0.6 10/28/2016 0339   EOSABS 0.5 10/28/2016 0339   BASOSABS 0.0 10/28/2016 0339    BMET    Component Value Date/Time   NA 134 (L) 10/30/2016 0328   NA 140 09/10/2016 1406   K 3.6 10/30/2016 0328   CL 102 10/30/2016 0328   CO2 19 (L) 10/30/2016 0328   GLUCOSE 101 (H) 10/30/2016 0328   BUN 6 10/30/2016 0328   BUN 10 09/10/2016 1406   CREATININE 0.67 10/30/2016 0328   CREATININE 0.88 05/17/2014 0819   CALCIUM 7.7 (L) 10/30/2016 0328   GFRNONAA >60 10/30/2016 0328   GFRNONAA >89 05/17/2014 0819   GFRAA >60 10/30/2016 0328   GFRAA >89 05/17/2014 0819    INR    Component Value Date/Time   INR 1.18 10/23/2016 1420     Intake/Output Summary (Last 24 hours) at 10/30/16 0752 Last data filed at 10/30/16 0109  Gross per 24 hour  Intake                0 ml  Output              875 ml  Net             -875 ml     [name] is s/p:  Aortobifemoral bypass grafting  7 Days  Post-Op  Plan: -pt doing well this morning-he has tolerated the clear liquid diet.  Will advance diet to regular diet. -no BM since Saturday.  Will give supp this morning and encouraged ambulating today. -will hep-lock IV and dc PCA and starrt Vicodin.  -DVT prophylaxis:  Lovenox/Coumadin bridge. -will restart his Toprol XL back, asa, statin.  Discontinue IV metoprolol.     [name], PA-C [facility] [phone] 10/30/2016 7:52 AM   I have independently interviewed and examined patient and agree with PA assessment and plan above. hliv and oob as tolerates. Will be able to go home with hhpt/ot.  Tru Leopard C. Donzetta Matters, MD [facility] [place] Office: [phone] Pager: [phone]

## 2016-10-30 NOTE — Telephone Encounter (Signed)
PCP was managing warfarin previously. Will deny refill for now since pt still admitted. Can refill if pt chooses to follow with [place] after discharge.

## 2016-10-30 NOTE — Telephone Encounter (Signed)
This call was improperly routed and since it was for warfarin I routed it to the coumadin clinic. Patient was started on eliquis by Dr Lovena Le. He is currently admitted and was changed to coumadin due to cost of eliquis. I am unsure of who will be managing his INR's therefore not sure where this needs to be sent.

## 2016-10-30 NOTE — Progress Notes (Signed)
Occupational Therapy Treatment Patient Details Name: [name] MRN: [id number] DOB: 01-Mar-1955 Today's Date: 10/30/2016    History of present illness 61 year old male with a history of right lower extremity rest pain and also has ulceration along the first metatarsal phalangeal joint. He recently underwent angiogram with 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 He underwent CT scan which demonstrated a 4.4 cm aneurysm as well as the occlusive disease in the bilateral common external iliac arteries as well as occlusive disease in the bilateral common femoral arteries. He was therefore indicated for aortobifemoral bypass.    OT comments  Pt is making excellent progress.  Able to perform LB ADLs with min guard assist and functional mobility with min guard assist.  Pain 6/10   Follow Up Recommendations  Home health OT;Supervision/Assistance - 24 hour    Equipment Recommendations  None recommended by OT    Recommendations for Other Services      Precautions / Restrictions Precautions Precautions: Fall       Mobility Bed Mobility Overal bed mobility: Modified Independent                Transfers Overall transfer level: Needs assistance Equipment used: Rolling walker (2 wheeled) Transfers: Sit to/from [facility] Sit to Stand: Min guard Stand pivot transfers: Min guard            Balance Overall balance assessment: Needs assistance Sitting-balance support: Feet supported Sitting balance-Leahy Scale: Good     Standing balance support: During functional activity;Single extremity supported Standing balance-Leahy Scale: Poor Standing balance comment: reliant on UE  support                            ADL either performed or assessed with clinical judgement   ADL Overall ADL's : Needs assistance/impaired             Lower Body Bathing: Min guard;Sit to/from stand       Lower Body Dressing: Min guard;Sit to/from stand   Toilet Transfer: Min guard;Grab bars;Ambulation;Comfort height toilet;RW   Toileting- Water quality scientist and Hygiene: Min guard;Sit to/from stand       Functional mobility during ADLs: Min guard;Rolling walker General ADL Comments: pt able to cross ankles over knees to access feet      Vision       Perception     Praxis      Cognition Arousal/Alertness: Awake/alert Behavior During Therapy: WFL for tasks assessed/performed Overall Cognitive Status: Within Functional Limits for tasks assessed                                          Exercises     Shoulder Instructions       General Comments      Pertinent Vitals/ Pain       Pain Assessment: 0-10 Faces Pain Scale: Hurts even more Pain Location: bilat LE Pain Descriptors / Indicators: Aching Pain Intervention(s): Monitored during session;Premedicated before session  Home Living  Prior Functioning/Environment              Frequency  Min 3X/week        Progress Toward Goals  OT Goals(current goals can now be found in the care plan section)  Progress towards OT goals: Progressing toward goals     Plan Discharge plan remains appropriate    Co-evaluation                 AM-PAC PT "6 Clicks" Daily Activity     Outcome Measure   Help from another person eating meals?: None Help from another person taking care of personal grooming?: A Little Help from another person toileting, which includes using toliet, bedpan, or urinal?: A Little Help from another person bathing (including washing, rinsing, drying)?: A Little Help from another person to  put on and taking off regular upper body clothing?: A Little Help from another person to put on and taking off regular lower body clothing?: A Little 6 Click Score: 19    End of Session Equipment Utilized During Treatment: Rolling walker  OT Visit Diagnosis: Pain;Other abnormalities of gait and mobility (R26.89);Muscle weakness (generalized) (M62.81) Pain - Right/Left: Right Pain - part of body: Leg   Activity Tolerance Patient tolerated treatment well   Patient Left in bed;with call bell/phone within reach   Nurse Communication Patient requests pain meds;Mobility status        Time: 1457-1510 OT Time Calculation (min): 13 min  Charges: OT General Charges $OT Visit: 1 Visit OT Treatments $Self Care/Home Management : 8-22 mins  [facility], OTR/L [phone]    Lucille Passy M 10/30/2016, 3:45 PM

## 2016-10-30 NOTE — Care Management Important Message (Signed)
Important Message  Patient Details  Name: [name] MRN: [id number] Date of Birth: [birth year]   Medicare Important Message Given:  Yes    [name] 10/30/2016, 10:08 AM

## 2016-10-30 NOTE — Telephone Encounter (Signed)
New message  *STAT* If patient is at the pharmacy, call can be transferred to refill team.   1. Which medications need to be refilled? (please list name of each medication and dose if known) warfarin (COUMADIN) tablet 7.5 mg  2. Which pharmacy/location (including street and city if local pharmacy) is medication to be sent to? [facility]  3. Do they need a 30 day or 90 day supply? 30 day supply  Reference number [id number]

## 2016-10-31 ENCOUNTER — Telehealth: Payer: Self-pay | Admitting: Vascular Surgery

## 2016-10-31 LAB — PROTIME-INR
INR: 1.39
Prothrombin Time: 16.9 seconds — ABNORMAL HIGH (ref 11.4–15.2)

## 2016-10-31 MED ORDER — WARFARIN SODIUM 10 MG PO TABS
10.0000 mg | ORAL_TABLET | Freq: Once | ORAL | Status: AC
  Administered 2016-10-31: 10 mg via ORAL
  Filled 2016-10-31: qty 1

## 2016-10-31 MED ORDER — NYSTATIN 100000 UNIT/ML MT SUSP
5.0000 mL | Freq: Four times a day (QID) | OROMUCOSAL | Status: DC
  Administered 2016-10-31 – 2016-11-01 (×8): 500000 [IU] via ORAL
  Filled 2016-10-31 (×9): qty 5

## 2016-10-31 MED ORDER — BISACODYL 10 MG RE SUPP
10.0000 mg | Freq: Once | RECTAL | Status: AC
  Administered 2016-10-31: 10 mg via RECTAL

## 2016-10-31 NOTE — Progress Notes (Signed)
ANTICOAGULATION CONSULT NOTE - Follow Up Consult  Pharmacy Consult for Coumadin and Lovenox Indication: atrial fibrillation  No Known Allergies  Patient Measurements: Height: 6\' 1"  (185.4 cm) Weight: 178 lb 1.6 oz (80.8 kg) IBW/kg (Calculated) : 79.9 Lovenox Dosing Weight: 81 kg  Vital Signs: Temp: 98.7 F (37.1 C) (09/21 0816) Temp Source: Oral (09/21 0816) BP: 107/73 (09/21 0816) Pulse Rate: 87 (09/21 0816)  Labs:  Recent Labs  10/30/16 0328 10/30/16 1039 10/31/16 0230  HGB 9.0*  --   --   HCT 27.4*  --   --   PLT 177  --   --   LABPROT  --  15.7* 16.9*  INR  --  1.26 1.39  CREATININE 0.67  --   --     Estimated Creatinine Clearance: 112.4 mL/min (by C-G formula based on SCr of 0.67 mg/dL).  Assessment:  Day #3 Lovenox/Coumadin overlap for atrial fibrillation and hx stroke. Previously on Eliquis, but changed to warfarin due to cost. Hgb low stable. Platelet count back into normal range.   INR 1.39 after Coumadin 7.5 mg daily x 2 days  Goal of Therapy:  INR 2-3 Anti-Xa level 0.6-1 units/ml 4hrs after LMWH dose given Monitor platelets by anticoagulation protocol: Yes   Plan:   Increase Coumadin to 10 mg x 1 today.  Lovenox 80 mg sq q12h.  Continue Lovenox until INR >2.  Intermittent CBC while on Lovenox.  Arty Baumgartner, [place] Pager: [phone] 10/31/2016,11:41 AM

## 2016-10-31 NOTE — Telephone Encounter (Signed)
-----   Message from Mena Goes, RN sent at 10/31/2016  3:09 PM EDT ----- Regarding: 2-3 weeks    ----- Message ----- From: Natividad Brood Sent: 10/31/2016   2:39 PM To: Vvs Charge Pool  F/u with Dr. Donzetta Matters in 2-3 weeks s/p aortobifemoral bypass grafting.  Thanks

## 2016-10-31 NOTE — Telephone Encounter (Signed)
Sched appt 11/28/16 at 9:30. Spoke to pt's wife.

## 2016-10-31 NOTE — Progress Notes (Addendum)
  Progress Note    10/31/2016 7:19 AM 8 Days Post-Op  Subjective:  Says "I feel pretty good".  Tolerated regular diet; still no BM-did not take supp ordered yesterday; c/o his tongue hurting  Afebrile HR 50's-100's NSR 119'J-478'G systolic 95% RA  Vitals:   10/31/16 0200 10/31/16 0553  BP:  121/62  Pulse: (!) 58 80  Resp: 14 19  Temp:  98.2 F (36.8 C)  SpO2: 94% 96%    Physical Exam: Cardiac:  regular Lungs:  Non labored  CBC    Component Value Date/Time   WBC 6.7 10/30/2016 0328   RBC 3.17 (L) 10/30/2016 0328   HGB 9.0 (L) 10/30/2016 0328   HGB 14.8 09/10/2016 1406   HCT 27.4 (L) 10/30/2016 0328   HCT 43.7 09/10/2016 1406   PLT 177 10/30/2016 0328   PLT 181 09/10/2016 1406   MCV 86.4 10/30/2016 0328   MCV 87 09/10/2016 1406   MCH 28.4 10/30/2016 0328   MCHC 32.8 10/30/2016 0328   RDW 12.9 10/30/2016 0328   RDW 14.3 09/10/2016 1406   LYMPHSABS 1.2 10/28/2016 0339   MONOABS 0.6 10/28/2016 0339   EOSABS 0.5 10/28/2016 0339   BASOSABS 0.0 10/28/2016 0339    BMET    Component Value Date/Time   NA 134 (L) 10/30/2016 0328   NA 140 09/10/2016 1406   K 3.6 10/30/2016 0328   CL 102 10/30/2016 0328   CO2 19 (L) 10/30/2016 0328   GLUCOSE 101 (H) 10/30/2016 0328   BUN 6 10/30/2016 0328   BUN 10 09/10/2016 1406   CREATININE 0.67 10/30/2016 0328   CREATININE 0.88 05/17/2014 0819   CALCIUM 7.7 (L) 10/30/2016 0328   GFRNONAA >60 10/30/2016 0328   GFRNONAA >89 05/17/2014 0819   GFRAA >60 10/30/2016 0328   GFRAA >89 05/17/2014 0819    INR    Component Value Date/Time   INR 1.39 10/31/2016 0230     Intake/Output Summary (Last 24 hours) at 10/31/16 0719 Last data filed at 10/31/16 0557  Gross per 24 hour  Intake                0 ml  Output              600 ml  Net             -600 ml     [name] is s/p:  Aortobifemoral bypass grafting  8 Days Post-Op  Plan: -pt doing well this am and tolerated his diet. -did not have BM  yet-will order dulcolax supp -INR trending upward-says [name] prescribed the coumadin, but would like to have his INR checked at Dr. Arelia Sneddon bc it is more convenient  -DVT prophylaxis:  Lovenox/coumadin bridge per pharmacy -swish and swallow nystatin    Trevor Locket, PA-C [facility] [phone] 10/31/2016 7:19 AM   I have independently examined and interviewed patient and agree with PA assessment and plan above.  [name] C. Donzetta Matters, MD [facility] [place] Office: [phone] Pager: [phone]

## 2016-10-31 NOTE — Care Management Note (Signed)
Case Management Note Marvetta Gibbons RN, BSN Unit 4E-Case Manager-- [place] coverage [phone]   Patient Details  Name: [name] MRN: [id number] Date of Birth: 1955/04/29  Subjective/Objective:   Pt admitted s/p Aortobifemoral bypass grafting                 Action/Plan: PTA pt lived at home with spouse- CM to follow for d/c needs- PT/OT evals pending  Expected Discharge Date:                  Expected Discharge Plan:  [place]  In-House Referral:     Discharge planning Services  CM Consult, Medication Assistance  Post Acute Care Choice:  Home Health Choice offered to:  Patient  DME Arranged:  N/A DME Agency:  NA  HH Arranged:  PT HH Agency:  [place]  Status of Service:  Completed, signed off  If discussed at [place] of Stay Meetings, dates discussed:    Discharge Disposition: home/home health   Additional Comments:  10/31/16- 1545- Marvetta Gibbons RN, CM- referral for Lovenox and HH needs- per insurance check Lovenox is a Tier 4 drug for pt - cost for 14 syringes would be $95- spoke with pt at bedside- Lovenox coverage shared and discussed- not sure if pt will need Lovenox or how many syringes- HHPT order placed- choice offered for [facility] services in [facility] per pt he has had [place] services in pas- believes it was [facility] and wants to use them again- no DME needs identified pt states he has what he needs- referral for HHPT called to [facility] with [facility] - planning for d/c over the weekend.   Dawayne Patricia, RN 10/31/2016, 3:53 PM

## 2016-10-31 NOTE — Progress Notes (Signed)
Physical Therapy Treatment Patient Details Name: [name] MRN: [id number] DOB: Jun 05, 1955 Today's Date: 10/31/2016    History of Present Illness 61 year old male with a history of right lower extremity rest pain and also has ulceration along the first metatarsal phalangeal joint. He recently underwent angiogram with 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 He underwent CT scan which demonstrated a 4.4 cm aneurysm as well as the occlusive disease in the bilateral common external iliac arteries as well as occlusive disease in the bilateral common femoral arteries. He was therefore indicated for aortobifemoral bypass.     PT Comments    Patient reported increased pain today in bilat LE but willing to participate. Pt was able to ambulate short distance without AD and min guard assist however does need RW for safety and energy conservation. Pt demonstrated slightly increased balance deficits this am but with no LOB. HR up to 120 with mobility. Continue to progress as tolerated.    Follow Up Recommendations  Home health PT;Supervision/Assistance - 24 hour     Equipment Recommendations  None recommended by PT    Recommendations for Other Services       Precautions / Restrictions Precautions Precautions: Fall    Mobility  Bed Mobility Overal bed mobility: Modified Independent                Transfers Overall transfer level: Needs assistance Equipment used: None Transfers: Sit to/from Stand Sit to Stand: Supervision         General transfer comment: supervision for safety; pt able to stand EOB without physical assist   Ambulation/Gait Ambulation/Gait assistance: Supervision;Min  guard Ambulation Distance (Feet):  (57ft then 371ft) Assistive device: None;Rolling walker (2 wheeled) Gait Pattern/deviations: Step-through pattern;Decreased step length - right;Decreased step length - left;Antalgic Gait velocity: decreased   General Gait Details: mildly antalgic gait and unsteady with turning/directional changes; pt ambulated 45ft with min guard and no AD and then with RW and supervision for safety   Stairs            Wheelchair Mobility    Modified Rankin (Stroke Patients Only)       Balance Overall balance assessment: Needs assistance Sitting-balance support: Feet supported Sitting balance-Leahy Scale: Good     Standing balance support: During functional activity;Single extremity supported Standing balance-Leahy Scale: Fair                              Cognition Arousal/Alertness: Awake/alert Behavior During Therapy: WFL for tasks assessed/performed Overall Cognitive Status: Within Functional Limits for tasks assessed                                        Exercises Total Joint Exercises Ankle Circles/Pumps: AROM;Both;10 reps;Supine    General Comments General comments (skin integrity, edema, etc.): HR up to 120 with mobility      Pertinent Vitals/Pain Pain Assessment: Faces Faces Pain Scale: Hurts little more Pain Location: bilat LE Pain Descriptors / Indicators: Aching Pain Intervention(s): Monitored during session;Premedicated before session;Repositioned    Home Living  Prior Function            PT Goals (current goals can now be found in the care plan section) Progress towards PT goals: Progressing toward goals    Frequency    Min 3X/week      PT Plan Current plan remains appropriate    Co-evaluation              AM-PAC PT "6 Clicks" Daily Activity  Outcome Measure  Difficulty turning over in bed (including adjusting bedclothes, sheets and blankets)?:  None Difficulty moving from lying on back to sitting on the side of the bed? : A Little Difficulty sitting down on and standing up from a chair with arms (e.g., wheelchair, bedside commode, etc,.)?: A Little Help needed moving to and from a bed to chair (including a wheelchair)?: A Little Help needed walking in hospital room?: A Little Help needed climbing 3-5 steps with a railing? : A Lot 6 Click Score: 18    End of Session Equipment Utilized During Treatment: Gait belt Activity Tolerance: Patient tolerated treatment well Patient left: in bed;with call bell/phone within reach Nurse Communication: Mobility status PT Visit Diagnosis: Unsteadiness on feet (R26.81);Muscle weakness (generalized) (M62.81)     Time: 9458-5929 PT Time Calculation (min) (ACUTE ONLY): 21 min  Charges:  $Gait Training: 8-22 mins                    G Codes:       Earney Navy, PTA Pager: [phone]     Darliss Cheney 10/31/2016, 11:51 AM

## 2016-11-01 LAB — PROTIME-INR
INR: 2.41
Prothrombin Time: 26 seconds — ABNORMAL HIGH (ref 11.4–15.2)

## 2016-11-01 LAB — CBC
HEMATOCRIT: 27.3 % — AB (ref 39.0–52.0)
HEMOGLOBIN: 8.9 g/dL — AB (ref 13.0–17.0)
MCH: 28.3 pg (ref 26.0–34.0)
MCHC: 32.6 g/dL (ref 30.0–36.0)
MCV: 86.9 fL (ref 78.0–100.0)
Platelets: 266 10*3/uL (ref 150–400)
RBC: 3.14 MIL/uL — AB (ref 4.22–5.81)
RDW: 13.2 % (ref 11.5–15.5)
WBC: 6.3 10*3/uL (ref 4.0–10.5)

## 2016-11-01 MED ORDER — MAGNESIUM CITRATE PO SOLN
300.0000 mL | Freq: Once | ORAL | Status: AC
  Administered 2016-11-01: 300 mL via ORAL
  Filled 2016-11-01: qty 592

## 2016-11-01 NOTE — Progress Notes (Signed)
ANTICOAGULATION CONSULT NOTE - Follow Up Consult  Pharmacy Consult for Coumadin   Indication: atrial fibrillation  No Known Allergies  Patient Measurements: Height: 6\' 1"  (185.4 cm) Weight: 177 lb 9.6 oz (80.6 kg) IBW/kg (Calculated) : 79.9 Lovenox Dosing Weight: 81 kg  Vital Signs: Temp: 98.5 F (36.9 C) (09/22 0544) Temp Source: Oral (09/22 0544) BP: 112/76 (09/22 0540) Pulse Rate: 58 (09/22 0540)  Labs:  Recent Labs  10/30/16 0328 10/30/16 1039 10/31/16 0230 11/01/16 0400  HGB 9.0*  --   --  8.9*  HCT 27.4*  --   --  27.3*  PLT 177  --   --  266  LABPROT  --  15.7* 16.9* 26.0*  INR  --  1.26 1.39 2.41  CREATININE 0.67  --   --   --    Estimated Creatinine Clearance: 112.4 mL/min (by C-G formula based on SCr of 0.67 mg/dL).  Assessment:  Day #4 Coumadin for atrial fibrillation and hx stroke. Previously on Eliquis, but changed to warfarin due to cost. HgB remains low 8.9 but stable. Platelet count back into normal range.  INR up tremendously overnight 1.39>2.41, no s/sx of bleeding documented; Lovenox discontinued today   Goal of Therapy:  INR 2-3 Anti-Xa level 0.6-1 units/ml 4hrs after LMWH dose given Monitor platelets by anticoagulation protocol: Yes   Plan:  HOLD Warfarin Daily INR/CBC Monitor for s/sx of bleeding  Dawayne Cirri, RPh Pager: [phone] 11/01/2016,11:45 AM

## 2016-11-01 NOTE — Progress Notes (Addendum)
AAA Progress Note    11/01/2016 7:31 AM 9 Days Post-Op  Subjective:  Only complaint today really is his tongue hurting on the left side.  Says the swish and swallow helped a little initially, but has not gotten anymore relief.  Says his feet feel good, but some tingling.  Afebrile HR 50's-70's  25'E-527'P systolic 82% RA  Vitals:   11/01/16 0540 11/01/16 0544  BP: 112/76   Pulse: (!) 58   Resp: 16   Temp: 98.5 F (36.9 C) 98.5 F (36.9 C)  SpO2: 96%     Physical Exam: Cardiac:  regular Lungs:  Non labored Abdomen:  Soft, NT/ND; no significant BM Incisions:  Laparotomy and bilateral groin incisions are healing nicely Extremities:  Bilateral feet are warm and well perfused.   CBC    Component Value Date/Time   WBC 6.3 11/01/2016 0400   RBC 3.14 (L) 11/01/2016 0400   HGB 8.9 (L) 11/01/2016 0400   HGB 14.8 09/10/2016 1406   HCT 27.3 (L) 11/01/2016 0400   HCT 43.7 09/10/2016 1406   PLT 266 11/01/2016 0400   PLT 181 09/10/2016 1406   MCV 86.9 11/01/2016 0400   MCV 87 09/10/2016 1406   MCH 28.3 11/01/2016 0400   MCHC 32.6 11/01/2016 0400   RDW 13.2 11/01/2016 0400   RDW 14.3 09/10/2016 1406   LYMPHSABS 1.2 10/28/2016 0339   MONOABS 0.6 10/28/2016 0339   EOSABS 0.5 10/28/2016 0339   BASOSABS 0.0 10/28/2016 0339    BMET    Component Value Date/Time   NA 134 (L) 10/30/2016 0328   NA 140 09/10/2016 1406   K 3.6 10/30/2016 0328   CL 102 10/30/2016 0328   CO2 19 (L) 10/30/2016 0328   GLUCOSE 101 (H) 10/30/2016 0328   BUN 6 10/30/2016 0328   BUN 10 09/10/2016 1406   CREATININE 0.67 10/30/2016 0328   CREATININE 0.88 05/17/2014 0819   CALCIUM 7.7 (L) 10/30/2016 0328   GFRNONAA >60 10/30/2016 0328   GFRNONAA >89 05/17/2014 0819   GFRAA >60 10/30/2016 0328   GFRAA >89 05/17/2014 0819    INR    Component Value Date/Time   INR 2.41 11/01/2016 0400     Intake/Output Summary (Last 24 hours) at 11/01/16 0731 Last data filed at 10/31/16 2344  Gross per 24  hour  Intake                0 ml  Output              300 ml  Net             -300 ml     Assessment/Plan:  61 y.o. male is s/p  Aortobifemoral bypass grafting.   9 Days Post-Op  -pt doing well but still no significant BM-will order mag citrate -INR now therapeutic with significant increase in INR from 1.39 to 2.41.  Discontinue Lovenox.  May want to keep another day to make sure his INR does not significantly increase again.  He does have appt with Dr. Arelia Sneddon on Monday at 75 and will have INR checked at that time.   -still with c/o his tongue hurting-swish and swallow ordered yesterday with a little relief yesterday, but still hurts. Pt unsure if he bit his tongue.  He does have some swelling on the left side.  This is not affecting his eating. -pt may shower-order placed   [name], PA-C [facility] [phone] 11/01/2016 7:31 AM  I agree with the  above.  Will keep one more day to evaluate coumadin levels since he had a significant increase in his INR overnight.  Lovenox will be d/c'd.  Annamarie Major

## 2016-11-02 LAB — PROTIME-INR
INR: 2.74
PROTHROMBIN TIME: 28.8 s — AB (ref 11.4–15.2)

## 2016-11-02 MED ORDER — HYDROCODONE-ACETAMINOPHEN 5-325 MG PO TABS: 1.0000 | ORAL_TABLET | Freq: Four times a day (QID) | ORAL | 0 refills | Status: DC | PRN

## 2016-11-02 NOTE — Discharge Instructions (Signed)
Information on my medicine - Coumadin®   (Warfarin) ° °This medication education was reviewed with me or my healthcare representative as part of my discharge preparation.   ° °Why was Coumadin prescribed for you? °Coumadin was prescribed for you because you have a blood clot or a medical condition that can cause an increased risk of forming blood clots. Blood clots can cause serious health problems by blocking the flow of blood to the heart, lung, or brain. Coumadin can prevent harmful blood clots from forming. °As a reminder your indication for Coumadin is:   Stroke Prevention Because Of Atrial Fibrillation ° °What test will check on my response to Coumadin? °While on Coumadin (warfarin) you will need to have an INR test regularly to ensure that your dose is keeping you in the desired range. The INR (international normalized ratio) number is calculated from the result of the laboratory test called prothrombin time (PT). ° °If an INR APPOINTMENT HAS NOT ALREADY BEEN MADE FOR YOU please schedule an appointment to have this lab work done by your health care provider within 7 days. °Your INR goal is usually a number between:  2 to 3 or your provider may give you a more narrow range like 2-2.5.  Ask your health care provider during an office visit what your goal INR is. ° °What  do you need to  know  About  COUMADIN? °Take Coumadin (warfarin) exactly as prescribed by your healthcare provider about the same time each day.  DO NOT stop taking without talking to the doctor who prescribed the medication.  Stopping without other blood clot prevention medication to take the place of Coumadin may increase your risk of developing a new clot or stroke.  Get refills before you run out. ° °What do you do if you miss a dose? °If you miss a dose, take it as soon as you remember on the same day then continue your regularly scheduled regimen the next day.  Do not take two doses of Coumadin at the same time. ° °Important Safety  Information °A possible side effect of Coumadin (Warfarin) is an increased risk of bleeding. You should call your healthcare provider right away if you experience any of the following: °? Bleeding from an injury or your nose that does not stop. °? Unusual colored urine (red or dark brown) or unusual colored stools (red or black). °? Unusual bruising for unknown reasons. °? A serious fall or if you hit your head (even if there is no bleeding). ° °Some foods or medicines interact with Coumadin® (warfarin) and might alter your response to warfarin. To help avoid this: °? Eat a balanced diet, maintaining a consistent amount of Vitamin K. °? Notify your provider about major diet changes you plan to make. °? Avoid alcohol or limit your intake to 1 drink for women and 2 drinks for men per day. °(1 drink is 5 oz. wine, 12 oz. beer, or 1.5 oz. liquor.) ° °Make sure that ANY health care provider who prescribes medication for you knows that you are taking Coumadin (warfarin).  Also make sure the healthcare provider who is monitoring your Coumadin knows when you have started a new medication including herbals and non-prescription products. ° °Coumadin® (Warfarin)  Major Drug Interactions  °Increased Warfarin Effect Decreased Warfarin Effect  °Alcohol (large quantities) °Antibiotics (esp. Septra/Bactrim, Flagyl, Cipro) °Amiodarone (Cordarone) °Aspirin (ASA) °Cimetidine (Tagamet) °Megestrol (Megace) °NSAIDs (ibuprofen, naproxen, etc.) °Piroxicam (Feldene) °Propafenone (Rythmol SR) °Propranolol (Inderal) °Isoniazid (INH) °Posaconazole (Noxafil) Barbiturates (Phenobarbital) °  Carbamazepine (Tegretol) Chlordiazepoxide (Librium) Cholestyramine (Questran) Griseofulvin Oral Contraceptives Rifampin Sucralfate (Carafate) Vitamin K   Coumadin (Warfarin) Major Herbal Interactions  Increased Warfarin Effect Decreased Warfarin Effect  Garlic Ginseng Ginkgo biloba Coenzyme Q10 Green tea St. Johns wort    Coumadin (Warfarin)  FOOD Interactions  Eat a consistent number of servings per week of foods HIGH in Vitamin K (1 serving =  cup)  Collards (cooked, or boiled & drained) Kale (cooked, or boiled & drained) Mustard greens (cooked, or boiled & drained) Parsley *serving size only =  cup Spinach (cooked, or boiled & drained) Swiss chard (cooked, or boiled & drained) Turnip greens (cooked, or boiled & drained)  Eat a consistent number of servings per week of foods MEDIUM-HIGH in Vitamin K (1 serving = 1 cup)  Asparagus (cooked, or boiled & drained) Broccoli (cooked, boiled & drained, or raw & chopped) Brussel sprouts (cooked, or boiled & drained) *serving size only =  cup Lettuce, raw (green leaf, endive, romaine) Spinach, raw Turnip greens, raw & chopped   These websites have more information on Coumadin (warfarin):  FailFactory.se; VeganReport.com.au;    [facility]  Discharge Instructions   Open Aortic Surgery  Please refer to the following instructions for your post-procedure care. Your surgeon or Physician Assistant will discuss any changes with you.  Activity  Avoid lifting more than eight pounds (a gallon of milk) until after your first post-operative visit. You are encouraged to walk as much as you can. You can slowly return to normal activities but must avoid strenuous activity and heavy lifting until your doctor tells you it's OK. Heavy lifting can hurt the incision and cause a hernia. Avoid activities such as vacuuming or swinging a golf club. It is normal to feel tired for several weeks after your surgery. Do not drive until your doctor gives the OK and you are no longer taking prescription pain medications. It is also normal to have difficulty with sleep habits, eating and bowl movements after surgery. These will go away with time.  Bathing/Showering  You may shower after you go home. Do not soak in a bathtub, hot tub, or swim until the  incision heals.  Incision Care  Shower every day. Clean your incision with mild soap and water. Pat the area dry with a clean towel. You do not need a bandage unless otherwise instructed. Do not apply any ointments or creams to your incision. You may have skin glue on your incision. Do not peel it off. It will come off on its own in about one week. If you have staples or sutures along your incision, they will be removed at your post op appointment.  If you have groin incisions, wash the groin wounds with soap and water daily and pat dry. (No tub bath-only shower)  Then put a dry gauze or washcloth in the groin to keep this area dry to help prevent wound infection.  Do this daily and as needed.  Do not use Vaseline or neosporin on your incisions.  Only use soap and water on your incisions and then protect and keep dry.  Diet  Resume your normal diet. There are no special food restriction following this procedure. A low fat/low cholesterol diet is recommended for all patients with vascular disease. After your aortic surgery, it's normal to feel full faster than usual and to not feel as hungry as you normally would. You will probably lose weight initially following your surgery. It's best to eat  small, frequent meals over the course of the day. Call the office if you find that you are unable to eat even small meals. In order to heal from your surgery, it is CRITICAL to get adequate nutrition. Your body requires vitamins, minerals, and protein. Vegetables are the best source of vitamins and minerals. causing pain, you may take over-the-counter pain reliever such as acetaminophen (Tylenol). If you were prescribed a stronger pain medication, please be aware these medication can cause nausea and constipation. Prevent nausea by taking the medication with a snack or meal. Avoid constipation by drinking plenty of fluids and eating foods with a high amount of fiber, such as fruits, vegetables and grains. Take 100mg   of the over-the-counter stool softener Colace twice a day as needed to help with constipation. A laxative, such as Milk of Magnesia, may be recommended for you at this time. Do not take a laxative unless your surgeon or P.A. tells you it's OK. Do not take Tylenol if you are taking stronger pain medications (such as Percocet or Vicodin).  Medications: Your Benazepril was stopped because your blood pressure is normal.  It is anticipated that once you are home, this will increase and eventually be able to be resumed.  Take 1/2 of a coumadin tablet (2.5mg ) tonight.  You will have your INR checked tomorrow by Dr. Arelia Sneddon.    Follow Up  Our office will schedule a follow up appointment 2-3 weeks after discharge.  Please call us immediately for any of the following conditions    .     Severe or worsening pain in your legs or feet or in your abdomen back or chest. Increased pain, redness drainage (pus) from your incision site. Increased abdominal pain, bloating, nausea, vomiting, or persistent diarrhea. Fever of 101 degrees or higher. Swelling in your leg (s).  Reduce your risk of vascular disease  Stop smoking. If you would like help, call [facility] at 1-800-QUIT-NOW 425-395-5624) or [facility] at 252-078-2509. Manage your cholesterol Maintain a desired weight Control your diabetes Keep your blood pressure down  If you have any questions please call the office at [phone].

## 2016-11-02 NOTE — Progress Notes (Signed)
Discharged to home with family office visits in place teaching done  

## 2016-11-02 NOTE — Progress Notes (Signed)
  [facility] Progress Note    11/02/2016 7:36 AM 10 Days Post-Op  Subjective:  Says his tongue still sore; got results from the mag citrate  Afebrile HR  55'D-32'K NSR 025'K systolic 27% RA  Vitals:   11/01/16 2317 11/02/16 0418  BP: (!) 118/97 (!) 114/58  Pulse: 66 (!) 58  Resp: 20 14  Temp: 98.6 F (37 C) 98.4 F (36.9 C)  SpO2: 97% 97%    Physical Exam: Cardiac:  regular Lungs:  CTAB Abdomen:  Soft, NT/ND; +BS; +BM Incisions:  Laparotomy incision and bilateral groin incisions are c/d/i Extremities:  Bilateral feet are warm and well perfused.   CBC    Component Value Date/Time   WBC 6.3 11/01/2016 0400   RBC 3.14 (L) 11/01/2016 0400   HGB 8.9 (L) 11/01/2016 0400   HGB 14.8 09/10/2016 1406   HCT 27.3 (L) 11/01/2016 0400   HCT 43.7 09/10/2016 1406   PLT 266 11/01/2016 0400   PLT 181 09/10/2016 1406   MCV 86.9 11/01/2016 0400   MCV 87 09/10/2016 1406   MCH 28.3 11/01/2016 0400   MCHC 32.6 11/01/2016 0400   RDW 13.2 11/01/2016 0400   RDW 14.3 09/10/2016 1406   LYMPHSABS 1.2 10/28/2016 0339   MONOABS 0.6 10/28/2016 0339   EOSABS 0.5 10/28/2016 0339   BASOSABS 0.0 10/28/2016 0339    BMET    Component Value Date/Time   NA 134 (L) 10/30/2016 0328   NA 140 09/10/2016 1406   K 3.6 10/30/2016 0328   CL 102 10/30/2016 0328   CO2 19 (L) 10/30/2016 0328   GLUCOSE 101 (H) 10/30/2016 0328   BUN 6 10/30/2016 0328   BUN 10 09/10/2016 1406   CREATININE 0.67 10/30/2016 0328   CREATININE 0.88 05/17/2014 0819   CALCIUM 7.7 (L) 10/30/2016 0328   GFRNONAA >60 10/30/2016 0328   GFRNONAA >89 05/17/2014 0819   GFRAA >60 10/30/2016 0328   GFRAA >89 05/17/2014 0819    INR    Component Value Date/Time   INR 2.74 11/02/2016 0445     Intake/Output Summary (Last 24 hours) at 11/02/16 0736 Last data filed at 11/02/16 0423  Gross per 24 hour  Intake                0 ml  Output              100 ml  Net             -100 ml     Assessment/Plan:  61 y.o. male is s/p    Aortobifemoral bypass grafting 10 Days Post-Op  -pt doing well this am-had BM after mag citrate -pt's INR up only to 2.74 today-will discharge on coumadin 5mg , which he will take 1/2 tonight.  He will have his INR checked tomorrow by Dr. Arelia Sneddon.  -discussed with pt taking stool softener with pain meds at home -discussed groin wound care -still with tongue swelling on the left side-Dr. Arelia Sneddon to evaluate to see if he feels he needs an ENT consult -discharge today.   [name], PA-C [facility] [phone] 11/02/2016 7:36 AM

## 2016-11-02 NOTE — Discharge Summary (Signed)
Open Aorta Discharge Summary    [name] 1955/09/30 61 y.o. male  [id number]  Admission Date: 10/23/2016  Discharge Date: 11/02/16  Physician: Thomes Lolling*  Admission Diagnosis: Abdominal Aortic Aneurysm  I71.4 Peripheral Arterial Disease  I70.92   HPI:   This is a 61 y.o. male with history of coronary artery disease as well as stroke. He also has a history of atrial fibrillation and now takes Eliquis although he is planning to switch to Coumadin in the near future due to cost. He also has a history of abdominal aortic aneurysm as well as a significant family history having family members who died from aneurysmal rupture. He was most recently evaluated by [name] for peripheral arterial disease. He has a right-sided first metatarsal head ulceration that has callus. There is no erythema and he has not had any erythema to suggest any infection. This is averse ulceration he has had of his legs. He does have significant rest pain in his right greater than left lower extremity as well. Along these lines he also has very short distance claudication and requires riding a cart through the grocery store because he cannot walk more than approximately 50 feet without calf cramping. He is not having any chest pain at this time. Recent CT scan performed and has been reviewed along with [name] prior to this visit.  Hospital Course:  The patient was admitted to the hospital and taken to the operating room on 10/23/2016 and underwent: Procedure Performed: 1.  Aortobifemoral bypass grafting with 18 x 15mm bifurcated dacron graft 2.  Left common femoral endarterectomy    Intraoperative findings: Findings: The neck aneurysm measured approximately 20 mm in diameter was heavily calcified but was endarterectomized and anastomosis was performed without issue. Distally on the aorta it was oversewn although was also heavily calcified and the common iliac arteries bilaterally were tied  off with umbilical tapes. The right common femoral artery was mildly aneurysmal and had significant gelatinous disease within but there was adequate backbleeding from the profunda femoris artery. The left common femoral artery had no backbleeding from the SFA or profunda femoris arteries and endarterectomy was performed until brisk backbleeding was noted from the profunda and sluggish backbleeding from the SFA. /The IMA had strong backbleeding and was ligated at its takeoff from the aneursym. At completion there was a strong signal at the left anterior tibial artery with monophasic right anterior tibial artery signals.  The pt tolerated the procedure well and was transported to the PACU in good condition.   By POD 1, he was doing well.  His NGT was left in for increased output.  He was kept in ICU that day.  By POD 2, he was doing well.  Was not passing flatus.  He was not passing flatus and given a suppository.  He was transferred to the stepdown unit. His a-line and foley were removed.   By POD 4, he was c/o a sore throat but no N/V.  He appeared mildly disstresed but denied any SOB, CP and some burning with voiding.  His WBC was up slightly.  A u/a was obtained and was negative.  His NGT was left in and a KUB obtained, which was unchanged from prior xray.   On POD 5, he was feeling better.  His leukocytosis had improved and was afebrile.  He was ambulating in the hallways.  He did have a mild hypokalemia and IVF were changed to add potassium and this did improve. His  NGT continued to have increased output for several days.  It was clamped and he did well and it was removed.   On POD 6, he was started on a lovenox/coumadin bridge for PAF.  His diet was advanced and he tolerated this well.  It was aventually advanced to regular diet, which he tolerated.   On POD 7, his PCA was discontinued and he was started on po pain med.  He was restarted on his home medications.    On POD 8, he was complaining  of his tongue hurting.  He was started on a swish and swallow and this did give him some relief, however, his tongue soreness did not improve.   On POD 9, he continued to have tongue soreness.  He is unable to stick his tongue out and does have some swelling on the left side of the tongue.  He is unsure if he bit his tongue.  It is not affecting his eating.     Dr. Trula Slade examined pt and is concerned about a differential dx of mass/tumor given he cannot stick out his tongue and pain.   May need further evaluation by ENT. Will defer to Dr. Arelia Sneddon when he sees him on Monday 11/03/16.     The pt did have a big jump in his INR from 1.26 to 1.39 to 2.41 after 3 doses of coumadin 7.5/7.5/10mg  respectively.  He was not given any coumadin after the jump to 2.41 and his INR was 2.74.  He is discharged home on this day with instructions to to take 1/2 tablet (2.5mg  of coumadin) Sunday night and have INR checked with Dr. Arelia Sneddon tomorrow.   He was having difficulty with BM and after a couple of suppositories, he was given magnesium citrate, which was successful.    The remainder of the hospital course consisted of increasing mobilization and increasing intake of solids without difficulty.  CBC    Component Value Date/Time   WBC 6.3 11/01/2016 0400   RBC 3.14 (L) 11/01/2016 0400   HGB 8.9 (L) 11/01/2016 0400   HGB 14.8 09/10/2016 1406   HCT 27.3 (L) 11/01/2016 0400   HCT 43.7 09/10/2016 1406   PLT 266 11/01/2016 0400   PLT 181 09/10/2016 1406   MCV 86.9 11/01/2016 0400   MCV 87 09/10/2016 1406   MCH 28.3 11/01/2016 0400   MCHC 32.6 11/01/2016 0400   RDW 13.2 11/01/2016 0400   RDW 14.3 09/10/2016 1406   LYMPHSABS 1.2 10/28/2016 0339   MONOABS 0.6 10/28/2016 0339   EOSABS 0.5 10/28/2016 0339   BASOSABS 0.0 10/28/2016 0339    BMET    Component Value Date/Time   NA 134 (L) 10/30/2016 0328   NA 140 09/10/2016 1406   K 3.6 10/30/2016 0328   CL 102 10/30/2016 0328   CO2 19 (L) 10/30/2016 0328    GLUCOSE 101 (H) 10/30/2016 0328   BUN 6 10/30/2016 0328   BUN 10 09/10/2016 1406   CREATININE 0.67 10/30/2016 0328   CREATININE 0.88 05/17/2014 0819   CALCIUM 7.7 (L) 10/30/2016 0328   GFRNONAA >60 10/30/2016 0328   GFRNONAA >89 05/17/2014 0819   GFRAA >60 10/30/2016 0328   GFRAA >89 05/17/2014 0819     Discharge Instructions    Discharge patient    Complete by:  As directed    Discharge disposition:  01-Home or Self Care   Discharge patient date:  11/02/2016      Discharge Diagnosis:  Abdominal Aortic Aneurysm  I71.4 Peripheral  Arterial Disease  I70.92  Secondary Diagnosis: Patient Active Problem List   Diagnosis Date Noted  . 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 Abdominal aortic aneurysm ([place]) 05/09/2014  . Other primary cardiomyopathies 06/12/2011  . Automatic implantable cardioverter-defibrillator in situ 09/10/2010  . Chronic systolic heart failure ([place]) 09/10/2010  . TOBACCO ABUSE 04/29/2010  . FEMORAL BRUIT, RIGHT 02/18/2010  . CVA 11/01/2009  . CARDIOMYOPATHY, ISCHEMIC 05/18/2008  . ACUTE BRONCHITIS 05/18/2008  . HYPERLIPIDEMIA-MIXED 05/17/2008  . Essential hypertension 05/17/2008  . Coronary atherosclerosis 05/17/2008  . GERD 05/17/2008   Past Medical History:  Diagnosis Date  . AAA (abdominal aortic aneurysm) ([place])   . AICD (automatic cardioverter/defibrillator) present   . CAD (coronary artery disease)    status post diaphragmatic wall infarction in 1995, treated with percutaneous transluminal coronary  angioplasty with subsequent stenting of the right coronary artery in 2002.   [name] CHF (congestive heart failure) ([place])   . GERD (gastroesophageal reflux disease)   . Hyperlipidemia   . Hypertension    Intolerant to MULTIPLE CHOLESTEROL MEDICATIONS.  [name] Myocardial infarction ([place])   . Neuromuscular disorder ([place])   . PFO (patent foramen ovale)   . Pneumonia   . Presence of permanent cardiac pacemaker   . Sleep apnea   . Status post  cholecystectomy   . Stroke [facility])      Allergies as of 11/02/2016   No Known Allergies     Medication List    STOP taking these medications   acetaminophen-codeine 300-30 MG tablet Commonly known as:  TYLENOL #3   benazepril 10 MG tablet Commonly known as:  LOTENSIN   ELIQUIS 5 MG Tabs tablet Generic drug:  apixaban     TAKE these medications   amitriptyline 25 MG tablet Commonly known as:  ELAVIL Take 25 mg by mouth at bedtime as needed for sleep.   aspirin EC 81 MG tablet Take 1 tablet (81 mg total) by mouth daily. What changed:  when to take this   HYDROcodone-acetaminophen 5-325 MG tablet Commonly known as:  NORCO/VICODIN Take 1-2 tablets by mouth every 6 (six) hours as needed for moderate pain.   magic mouthwash Soln Take 5 mLs by mouth 4 (four) times daily as needed (for canker sore/mouth pain.).   metoprolol succinate 50 MG 24 hr tablet Commonly known as:  TOPROL-XL Take 25 mg by mouth every evening. Take with or immediately following a meal.   nitroGLYCERIN 0.4 MG SL tablet Commonly known as:  NITROSTAT Place 1 tablet (0.4 mg total) under the tongue every 5 (five) minutes as needed for chest pain. May repeat 3 times.   simvastatin 40 MG tablet Commonly known as:  ZOCOR Take 1 tablet (40 mg total) by mouth at bedtime.   varenicline 0.5 MG X 11 & 1 MG X 42 tablet Commonly known as:  CHANTIX STARTING MONTH PAK Take one 0.5 mg tablet by mouth once daily for 3 days, then increase to one 0.5 mg tablet twice daily for 4 days, then increase to one 1 mg tablet twice daily. What changed:  how much to take  how to take this  when to take this  additional instructions            Discharge Care Instructions        Start     Ordered   11/02/16 0000  HYDROcodone-acetaminophen (NORCO/VICODIN) 5-325 MG tablet  Every 6 hours PRN    Question:  Supervising Provider  Answer:  [name]  11/02/16 0743   11/02/16 0000  Discharge patient      Question Answer Comment  Discharge disposition 01-Home or Self Care   Discharge patient date 11/02/2016      11/02/16 5397       Instructions:   [facility]  Discharge Instructions   Open Aortic Surgery  Please refer to the following instructions for your post-procedure care. Your surgeon or Physician Assistant will discuss any changes with you.  Activity  Avoid lifting more than eight pounds (a gallon of milk) until after your first post-operative visit. You are encouraged to walk as much as you can. You can slowly return to normal activities but must avoid strenuous activity and heavy lifting until your doctor tells you it's OK. Heavy lifting can hurt the incision and cause a hernia. Avoid activities such as vacuuming or swinging a golf club. It is normal to feel tired for several weeks after your surgery. Do not drive until your doctor gives the OK and you are no longer taking prescription pain medications. It is also normal to have difficulty with sleep habits, eating and bowl movements after surgery. These will go away with time.  Bathing/Showering  You may shower after you go home. Do not soak in a bathtub, hot tub, or swim until the incision heals.  Incision Care  Shower every day. Clean your incision with mild soap and water. Pat the area dry with a clean towel. You do not need a bandage unless otherwise instructed. Do not apply any ointments or creams to your incision. You may have skin glue on your incision. Do not peel it off. It will come off on its own in about one week. If you have staples or sutures along your incision, they will be removed at your post op appointment.  If you have groin incisions, wash the groin wounds with soap and water daily and pat dry. (No tub bath-only shower)  Then put a dry gauze or washcloth in the groin to keep this area dry to help prevent wound infection.  Do this daily and as needed.  Do not use Vaseline or  neosporin on your incisions.  Only use soap and water on your incisions and then protect and keep dry.  Diet  Resume your normal diet. There are no special food restriction following this procedure. A low fat/low cholesterol diet is recommended for all patients with vascular disease. After your aortic surgery, it's normal to feel full faster than usual and to not feel as hungry as you normally would. You will probably lose weight initially following your surgery. It's best to eat small, frequent meals over the course of the day. Call the office if you find that you are unable to eat even small meals. In order to heal from your surgery, it is CRITICAL to get adequate nutrition. Your body requires vitamins, minerals, and protein. Vegetables are the best source of vitamins and minerals. causing pain, you may take over-the-counter pain reliever such as acetaminophen (Tylenol). If you were prescribed a stronger pain medication, please be aware these medication can cause nausea and constipation. Prevent nausea by taking the medication with a snack or meal. Avoid constipation by drinking plenty of fluids and eating foods with a high amount of fiber, such as fruits, vegetables and grains. Take 100mg  of the over-the-counter stool softener Colace twice a day as needed to help with constipation. A laxative, such as Milk of Magnesia, may be recommended for you at  this time. Do not take a laxative unless your surgeon or P.A. tells you it's OK. Do not take Tylenol if you are taking stronger pain medications (such as Percocet).  Medications: Your Benazepril was stopped because your blood pressure is normal.  It is anticipated that once you are home, this will increase and eventually be able to be resumed.   Take 1/2 of a coumadin tablet (2.5mg ) tonight.  You will have your INR checked tomorrow by Dr. Arelia Sneddon.    Follow Up  Our office will schedule a follow up appointment 2-3 weeks after discharge.  Please call us  immediately for any of the following conditions    .     Severe or worsening pain in your legs or feet or in your abdomen back or chest. Increased pain, redness drainage (pus) from your incision site. Increased abdominal pain, bloating, nausea, vomiting, or persistent diarrhea. Fever of 101 degrees or higher. Swelling in your leg (s).  Reduce your risk of vascular disease  Stop smoking. If you would like help, call [facility] at 1-800-QUIT-NOW 442-223-1842) or [place] at 2083968687. Manage your cholesterol Maintain a desired weight Control your diabetes Keep your blood pressure down  If you have any questions please call the office at [phone].   Prescriptions given: 1.  Vicodin 5/325 #30 No Refill 2.  Recommended OTC stool softener while taking pain medication  Disposition: home  Patient's condition: is Good  Follow up: 1. Dr. Trula Slade in 2 weeks 2. Dr. Arelia Sneddon 11/03/16 at 1230 for visit and labs.    Dr. Trula Slade examined pt and is concerned about a differential dx of mass/tumor given he cannot stick out his tongue and pain.   May need further evaluation by ENT. Will defer to Dr. Arelia Sneddon when he sees him on Monday 11/03/16.    The pt did have a big jump in his INR from 1.26 to 1.39 to 2.41 after 3 doses of coumadin 7.5/7.5/10mg  respectively.  He was not given any coumadin after the jump to 2.41 and his INR was 2.74.  He is discharged home on this day with instructions to to take 1/2 tablet (2.5mg  of coumadin) Sunday night and have INR checked with Dr. Arelia Sneddon tomorrow.    [name], PA-C [facility] [phone] 11/02/2016  8:04 AM     - For VQI Registry use -  Post-op:  Time to Extubation: [x]  In OR, [ ]  < 12 hrs, [ ]  12-24 hrs, [ ]  >=24 hrs Vasopressors Req. Post-op: No ICU Stay: 2 day in icu Transfusion: No   If yes, n/a units given MI: No, [ ]  Troponin only, [ ]  EKG or Clinical New Arrhythmia: No  Complications: CHF: No Resp  failure: No, [ ]  Pneumonia, [ ]  Ventilator Chg in renal function: No, [ ]  Inc. Cr > 0.5, [ ]  Temp. Dialysis, [ ]  Permanent dialysis Leg ischemia: No, no Surgery needed, [ ]  Yes, Surgery needed, [ ]  Amputation Bowel ischemia: No, [ ]  Medical Rx, [ ]  Surgical Rx Wound complication: No, [ ]  Superficial separation/infection, [ ]  Return to OR Return to OR: No  Return to OR for bleeding: No Stroke: No, [ ]  Minor, [ ]  Major  Discharge medications: Statin use:  Yes  ASA use:  Yes   Plavix use:  No  Beta blocker use:  Yes  ACEI use:  No ARB use:  No CCB use:  No Coumadin use:  No

## 2016-11-11 ENCOUNTER — Encounter: Payer: Medicare Other | Admitting: Vascular Surgery

## 2016-11-13 ENCOUNTER — Telehealth: Payer: Self-pay | Admitting: *Deleted

## 2016-11-13 NOTE — Telephone Encounter (Signed)
Patient's wife called and left message stating that patient was out of pain medication.. I called back and spoke to patient. He states that he had to see his medical doctor concerning sores in his mouth to the extent of interfering with his speech and that physician prescribed him pain medication.  He is concerned about a thirty pound weight loss in a short period.  He is following with an ear, nose and throat physician and primary care.

## 2016-11-14 ENCOUNTER — Other Ambulatory Visit (HOSPITAL_COMMUNITY): Payer: Self-pay | Admitting: Otolaryngology

## 2016-11-14 ENCOUNTER — Other Ambulatory Visit: Payer: Self-pay | Admitting: Otolaryngology

## 2016-11-14 DIAGNOSIS — C029 Malignant neoplasm of tongue, unspecified: Secondary | ICD-10-CM

## 2016-11-26 ENCOUNTER — Ambulatory Visit (INDEPENDENT_AMBULATORY_CARE_PROVIDER_SITE_OTHER): Payer: Medicare Other | Admitting: *Deleted

## 2016-11-26 DIAGNOSIS — I428 Other cardiomyopathies: Secondary | ICD-10-CM

## 2016-11-26 DIAGNOSIS — I429 Cardiomyopathy, unspecified: Secondary | ICD-10-CM

## 2016-11-27 ENCOUNTER — Ambulatory Visit (HOSPITAL_COMMUNITY)
Admission: RE | Admit: 2016-11-27 | Discharge: 2016-11-27 | Disposition: A | Discharge status: Home / Self Care | Payer: Medicare Other | Source: Ambulatory Visit | Attending: Otolaryngology | Admitting: Otolaryngology

## 2016-11-27 ENCOUNTER — Encounter (HOSPITAL_COMMUNITY): Payer: Medicare Other

## 2016-11-27 ENCOUNTER — Encounter (HOSPITAL_COMMUNITY): Payer: Self-pay

## 2016-11-27 DIAGNOSIS — I6523 Occlusion and stenosis of bilateral carotid arteries: Secondary | ICD-10-CM | POA: Diagnosis not present

## 2016-11-27 DIAGNOSIS — C029 Malignant neoplasm of tongue, unspecified: Secondary | ICD-10-CM | POA: Diagnosis not present

## 2016-11-27 DIAGNOSIS — M5030 Other cervical disc degeneration, unspecified cervical region: Secondary | ICD-10-CM | POA: Diagnosis not present

## 2016-11-27 DIAGNOSIS — I672 Cerebral atherosclerosis: Secondary | ICD-10-CM | POA: Diagnosis not present

## 2016-11-27 MED ORDER — IOPAMIDOL (ISOVUE-300) INJECTION 61%
75.0000 mL | Freq: Once | INTRAVENOUS | Status: AC | PRN
  Administered 2016-11-27: 75 mL via INTRAVENOUS

## 2016-11-27 MED ORDER — IOPAMIDOL (ISOVUE-300) INJECTION 61%
INTRAVENOUS | Status: AC
  Filled 2016-11-27: qty 75

## 2016-11-27 NOTE — Progress Notes (Signed)
Remote ICD transmission.   

## 2016-11-28 ENCOUNTER — Encounter: Payer: Self-pay | Admitting: Cardiology

## 2016-11-28 ENCOUNTER — Ambulatory Visit (INDEPENDENT_AMBULATORY_CARE_PROVIDER_SITE_OTHER): Payer: Self-pay | Admitting: Vascular Surgery

## 2016-11-28 ENCOUNTER — Encounter: Payer: Self-pay | Admitting: Vascular Surgery

## 2016-11-28 VITALS — BP 130/86 | HR 85 | Temp 97.6°F | Resp 18 | Ht 73.0 in | Wt 166.0 lb

## 2016-11-28 DIAGNOSIS — I739 Peripheral vascular disease, unspecified: Secondary | ICD-10-CM

## 2016-11-28 DIAGNOSIS — I714 Abdominal aortic aneurysm, without rupture, unspecified: Secondary | ICD-10-CM

## 2016-11-28 NOTE — Progress Notes (Signed)
Subjective:     Patient ID: [name], male   DOB: Feb 18, 1955, 61 y.o.   MRN: [id number]  HPI 61 year old male follows up from recent aortobifemoral bypass for aneurysmal and occlusive disease. He did have an ulcer on the right first metatarsal head but that has now healed. He does continue to have ruborous right foot without fevers or any erythema but he does have rest pain. He also has pain with walking that he states is cramping in nature. The left leg is much better than the right. He has healed his wounds are fine his aortobifemoral bypass. He has lost 30 pounds because he is unable to eat and unfortunately was recently diagnosed with tongue cancer. He is going to be seen at [facility] for this.   Review of Systems Right foot rest pain 30lb weight loss    Objective:   Physical Exam Large tongue mass Well healed abdominal/groin incisions Palpable femoral pulses Monophasic dp signals Right foot is ruborous, no wounds     Assessment/plan     61 year old male follows up after aortobifemoral bypass grafting. He no longer has a wound on his right foot. He does continue to have rest pain. His previous angiogram demonstrated occluded right SFA but his below-knee vessels were incompletely visualized given the proximal obstructions that he has. He remains on Coumadin and aspirin. He is going to have his tongue cancer locality [facility] next Monday. He is also going to be an angiogram widely from his left brachial artery approach and hopefully we can revascularize his right lower extremity endovascularly this way. I will see him back in 4 weeks and hopefully he will have had his tongue cancer operated on by that time. If for whatever reason his cancer will be delayed or will be nonsurgical we can proceed with right lower extremity intervention in the coming weeks given his severity of pain and the concern that he could have repeat ulceration given the ongoing rubor. He demonstrates good  understanding we will plan to see him in 4 weeks with repeat ABIs.        Kamsiyochukwu Buist C. Donzetta Matters, MD [facility] [place] Office: [phone] Pager: [phone]

## 2016-12-01 ENCOUNTER — Ambulatory Visit: Payer: Medicare Other | Admitting: Cardiology

## 2016-12-01 NOTE — Addendum Note (Signed)
Addended by: Lianne Cure A on: 12/01/2016 10:38 AM   Modules accepted: Orders

## 2016-12-05 ENCOUNTER — Telehealth: Payer: Self-pay | Admitting: Internal Medicine

## 2016-12-05 NOTE — Telephone Encounter (Signed)
Faxed to Dr Waynard Edwards office via [employer].

## 2016-12-05 NOTE — Telephone Encounter (Signed)
    Chart reviewed as part of pre-operative protocol coverage. Patient was contacted 12/05/2016 in reference to pre-operative risk assessment for pending surgery as outlined below.  RICCO DERSHEM was last seen on 09/02/2016 by [name].  Since that day, TRAVEZ STANCIL has done very well. He underwent recent stress test in August which showed previous scar, but minimal ischemia. He underwent aortobifemoral bypass last month without any issue. He is recovering quite well. He is not eating much due to tongue issue. But otherwise, no chest pain or SOB.   Therefore, based on ACC/AHA guidelines, the patient would be at acceptable risk for the planned procedure without further cardiovascular testing. Given his past history and low EF, he is at least a moderate risk if not high patient for the intended surgery  He will need a followup with [name], but this does not need to be done prior to the surgery.    [place], [place] 12/05/2016, 4:08 PM

## 2016-12-05 NOTE — Telephone Encounter (Signed)
° °  [facility] Pre-operative Risk Assessment    Request for surgical clearance:  1. What type of surgery is being performed? Partial glossectomy, selective neck dissection, tracheotomy, free flap radial forearm, and a split thickness skin graft  2. When is this surgery scheduled? 12/30/16  3. Are there any medications that need to be held prior to surgery and how long? Please inform if any medications need to be held.  4. Practice name and name of physician performing surgery? [facility], Dr. Ardath Sax  5. What is your office phone and fax number? [place]: [phone], FAX: [phone]  3. Anesthesia type (None, local, MAC, general) ? Not listed    Derl Barrow 12/05/2016, 1:45 PM  _________________________________________________________________   (provider comments below)

## 2016-12-11 ENCOUNTER — Ambulatory Visit (INDEPENDENT_AMBULATORY_CARE_PROVIDER_SITE_OTHER): Payer: Medicare Other | Admitting: Cardiology

## 2016-12-11 ENCOUNTER — Encounter: Payer: Self-pay | Admitting: Cardiology

## 2016-12-11 ENCOUNTER — Telehealth: Payer: Self-pay | Admitting: *Deleted

## 2016-12-11 VITALS — BP 108/76 | HR 80 | Ht 73.0 in | Wt 164.0 lb

## 2016-12-11 DIAGNOSIS — I2510 Atherosclerotic heart disease of native coronary artery without angina pectoris: Secondary | ICD-10-CM | POA: Diagnosis not present

## 2016-12-11 DIAGNOSIS — Z720 Tobacco use: Secondary | ICD-10-CM

## 2016-12-11 DIAGNOSIS — E7800 Pure hypercholesterolemia, unspecified: Secondary | ICD-10-CM

## 2016-12-11 DIAGNOSIS — I739 Peripheral vascular disease, unspecified: Secondary | ICD-10-CM

## 2016-12-11 DIAGNOSIS — I10 Essential (primary) hypertension: Secondary | ICD-10-CM | POA: Diagnosis not present

## 2016-12-11 MED ORDER — ATORVASTATIN CALCIUM 80 MG PO TABS: 80.0000 mg | ORAL_TABLET | Freq: Every day | ORAL | 3 refills | Status: AC

## 2016-12-11 NOTE — Patient Instructions (Signed)
Medication Instructions:   STOP SIMVASTATIN  START ATORVASTATIN 80 MG ONCE DAILY  Labwork:  Your physician recommends that you return for lab work in: [place]:  Your physician wants you to follow-up in: [place] will receive a reminder letter in the mail two months in advance. If you don't receive a letter, please call our office to schedule the follow-up appointment.   If you need a refill on your cardiac medications before your next appointment, please call your pharmacy.

## 2016-12-11 NOTE — Progress Notes (Signed)
HPI: FU CM, CAD and prior CVA. 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 In 2002 he had a bare-metal stent to the right coronary artery. He was admitted with a stroke with symptoms of confusion and ataxia in Sept of 2011. He had a TEE which showed an ejection fraction of 30-35% with no evident thrombus. 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 His MRI showed multiple posterior circulation infarct. Because his LV function was worse he underwent cardiac catheterization in January of 2012. This revealed 25 LM. There is a 50-60% stenosis of the mid-LAD. There is a diagonal branch arising from this area with ostial 60-70% stenosis. There is an ostial 50% stenosis of the circumflex/intermediate noted. There is a patent stent in the RCA midvessel with 30-40% in-stent restenosis. The proximal and distal vessel both have 50% stenosis noted. The ejection fraction was 35%. Patient had an ICD placed February 2012. Nuclear study August 2018 showed ejection fraction 33%, inferior infarct with minimal peri-infarct ischemia. Also with PAF. Recently had surgical repair of abdominal aortic aneurysm. Since I last saw him, patient denies dyspnea, chest pain or syncope. He continues to have significant claudication right greater than left. He has a tongue cancer which will require surgical resection on November 20. He will also ultimately require further surgical intervention for peripheral vascular disease.   Current Outpatient Prescriptions  Medication Sig Dispense Refill  . amitriptyline (ELAVIL) 25 MG tablet Take 25 mg by mouth at bedtime as needed for sleep.     [name] aspirin EC 81 MG tablet Take 1 tablet (81 mg total) by mouth daily. (Patient taking differently: Take 81 mg by mouth every evening. ) 90 tablet 3  . HYDROcodone-acetaminophen (NORCO/VICODIN) 5-325 MG tablet Take 1-2 tablets by mouth every 6 (six) hours as needed for moderate  pain. 30 tablet 0  . magic mouthwash SOLN Take 5 mLs by mouth 4 (four) times daily as needed (for canker sore/mouth pain.).     [name] metoprolol succinate (TOPROL-XL) 50 MG 24 hr tablet Take 25 mg by mouth every evening. Take with or immediately following a meal.     . nitroGLYCERIN (NITROSTAT) 0.4 MG SL tablet Place 1 tablet (0.4 mg total) under the tongue every 5 (five) minutes as needed for chest pain. May repeat 3 times. 25 tablet 3  . simvastatin (ZOCOR) 40 MG tablet Take 1 tablet (40 mg total) by mouth at bedtime. 30 tablet 6   No current facility-administered medications for this visit.      Past Medical History:  Diagnosis Date  . AAA (abdominal aortic aneurysm) ([place])   . AICD (automatic cardioverter/defibrillator) present   . CAD (coronary artery disease)    status post diaphragmatic wall infarction in 1995, treated with percutaneous transluminal coronary  angioplasty with subsequent stenting of the right coronary artery in 2002.   [name] CHF (congestive heart failure) ([place])   . GERD (gastroesophageal reflux disease)   . Hyperlipidemia   . Hypertension    Intolerant to MULTIPLE CHOLESTEROL MEDICATIONS.  [name] Myocardial infarction ([place])   . Neuromuscular disorder ([place])   . PFO (patent foramen ovale)   . Pneumonia   . Presence of permanent cardiac pacemaker   . Sleep apnea   . Status post cholecystectomy   . Stroke [facility])     Past Surgical History:  Procedure Laterality Date  . ABDOMINAL AORTOGRAM W/LOWER EXTREMITY  N/A 09/17/2016   Procedure: Abdominal Aortogram w/Lower Extremity;  Surgeon: [name], MD;  Location: [facility];  Service: Cardiovascular;  Laterality: N/A;  . ANKLE SURGERY    . AORTA - BILATERAL FEMORAL ARTERY BYPASS GRAFT Bilateral 10/23/2016   Procedure: AORTA BIFEMORAL BYPASS GRAFT USING HEMASHIELD GOLD;  Surgeon: [name], MD;  Location: [place];  Service: Vascular;  Laterality: Bilateral;  . APPENDECTOMY    . CARDIAC CATHETERIZATION       Stent x1  . CARDIAC DEFIBRILLATOR PLACEMENT     St. Jude  . CHOLECYSTECTOMY  12/02/02  . CORONARY ANGIOPLASTY    . FRACTURE SURGERY     Left leg compound fracture  . HERNIA REPAIR    . INSERT / REPLACE / REMOVE PACEMAKER    . PTCA     stent of the RCA 07/24/00    Social History   Social History  . Marital status: Married    Spouse name: N/A  . Number of children: N/A  . Years of education: N/A   Occupational History  . Not on file.   Social History Main Topics  . Smoking status: Former Smoker    Packs/day: 0.25    Years: 40.00    Quit date: 10/22/2016  . Smokeless tobacco: Never Used  . Alcohol use No  . Drug use: No  . Sexual activity: Not on file   Other Topics Concern  . Not on file   Social History Narrative  . No narrative on file    Family History  Problem Relation Age of Onset  . Coronary artery disease Unknown   . Dementia Mother        on hospice as of 05/12/2016  . Heart disease Father     ROS: Tongue pain and claudication but no fevers or chills, productive cough, hemoptysis, dysphasia, odynophagia, melena, hematochezia, dysuria, hematuria, rash, seizure activity, orthopnea, PND, pedal edema, claudication. Remaining systems are negative.  Physical Exam: Well-developed thin in no acute distress.  Skin is warm and dry.  HEENT is normal.  Neck is supple.  Chest is clear to auscultation with normal expansion.  Cardiovascular exam is regular rate and rhythm.  Abdominal exam nontender or distended. No masses palpated.Status post recent abdominal surgery. No evidence of infection.   Extremities show no edema. neuro grossly intact  ECG- Sinus rhythm at a rate of 80. Prior inferior infarct. personally reviewed  A/P  1 Ischemic cardiomyopathy-continue beta blocker and ARB. No evidence of congestive heart failure.   2 coronary artery disease-continue aspirin and statin. Most recent nuclear study shows infarct with trivial peri-infarct ischemia.  3  hypertension-blood pressure is controlled. Continue present medications.  4 hyperlipidemia-given significant vascular disease I will discontinue Zocor and instead treat with Lipitor 80 mg daily. Check lipids and liver in 4 weeks.  5 prior ICD-followed by [name].  6 status post abdominal aortic aneurysm repair  7 peripheral vascular disease-continue aspirin and statin. He will need further surgical intervention once he has his tongue cancer resected.  8 preoperative evaluation prior to surgical resection of tongue cancer-most recent nuclear study showed infarct with minimal ischemia and he is not having chest pain. No plans for further evaluation preoperatively. Note he also tolerated repair of his abdominal aortic aneurysm without difficulty. Given history of CVA and paroxysmal atrial fibrillation he will need to discontinue Coumadin 5 days prior to his procedure with Lovenox bridge.  9 paroxysmal atrial fibrillation-patient in sinus rhythm today. Continue beta  blocker and Coumadin. Coumadin is managed by primary care.  10 tobacco abuse-patient counseled on discontinuing.  11 tongue cancer-for surgical resection on November 20.  Kirk Ruths, MD

## 2016-12-11 NOTE — Telephone Encounter (Signed)
Patient is having surgery 12-30-16 and will need lovenox bridging. His warfarin is followed in dr Arelia Sneddon office. Called and spoke with amy, nurse in dr Arelia Sneddon office, she is going to let us know if they would like for [place] to do the bridging or if they are going to take care of it.

## 2016-12-12 NOTE — Telephone Encounter (Signed)
Follow up    Returning your call from yesterday about lovenox bridge

## 2016-12-12 NOTE — Telephone Encounter (Signed)
Returned call to Amy, RN at Dr. Arelia Sneddon office. PCP would prefer lovenox bridging be handled by our clinical pharmacist. Message routed to Hilda Blades, [facility] pharmacy staff.

## 2016-12-12 NOTE — Telephone Encounter (Signed)
Patient scheduled for bridge appt 11/13 at 11:15

## 2016-12-18 LAB — CUP PACEART REMOTE DEVICE CHECK
Battery Remaining Longevity: 48 mo
HighPow Impedance: 45 Ohm
Implantable Lead Implant Date: 20120220
Implantable Lead Location: 753860
Implantable Lead Model: 185
Lead Channel Pacing Threshold Pulse Width: 0.5 ms
Lead Channel Sensing Intrinsic Amplitude: 11.4 mV
Lead Channel Setting Pacing Pulse Width: 0.5 ms
MDC IDC LEAD SERIAL: 346356
MDC IDC MSMT BATTERY REMAINING PERCENTAGE: 43 %
MDC IDC MSMT BATTERY VOLTAGE: 2.93 V
MDC IDC MSMT LEADCHNL RV IMPEDANCE VALUE: 430 Ohm
MDC IDC MSMT LEADCHNL RV PACING THRESHOLD AMPLITUDE: 0.75 V
MDC IDC PG IMPLANT DT: 20120220
MDC IDC PG SERIAL: 783786
MDC IDC SESS DTM: 20181017063015
MDC IDC SET LEADCHNL RV PACING AMPLITUDE: 2.5 V
MDC IDC SET LEADCHNL RV SENSING SENSITIVITY: 0.5 mV
MDC IDC STAT BRADY RV PERCENT PACED: 1 %

## 2016-12-19 ENCOUNTER — Inpatient Hospital Stay (HOSPITAL_COMMUNITY): Admission: RE | Admit: 2016-12-19 | Payer: Medicare Other | Source: Ambulatory Visit

## 2016-12-19 ENCOUNTER — Ambulatory Visit: Payer: Medicare Other | Admitting: Vascular Surgery

## 2016-12-23 ENCOUNTER — Ambulatory Visit (INDEPENDENT_AMBULATORY_CARE_PROVIDER_SITE_OTHER): Payer: Medicare Other | Admitting: Pharmacist Clinician (PhC)/ Clinical Pharmacy Specialist

## 2016-12-23 DIAGNOSIS — Z7901 Long term (current) use of anticoagulants: Secondary | ICD-10-CM | POA: Diagnosis not present

## 2016-12-23 LAB — POCT INR: INR: 1.8

## 2016-12-23 MED ORDER — ENOXAPARIN SODIUM 80 MG/0.8ML ~~LOC~~ SOLN: 80.0000 mg | Freq: Two times a day (BID) | SUBCUTANEOUS | 0 refills | Status: DC

## 2017-01-09 ENCOUNTER — Telehealth: Payer: Self-pay | Admitting: *Deleted

## 2017-01-09 NOTE — Telephone Encounter (Signed)
Received notification from [facility] that they have attempted to contact patient multiple times to get appt for set up and have not had any return call.   Request we call patient to discuss.    Called patient, spoke to wife who advised me patient is in ICU at [facility].    States he had his cancer surgery and had a "massive heart attack".    States they are trying to get him stable enough to do bypass surgery.   Condolences given, advised wife that once patient recovers to call office and we can discuss CPAP at that time.   Wife agrees and verbalized understanding.     Wife states she would like for me to make [name] aware.    Route to [name].

## 2017-01-23 DIAGNOSIS — Z951 Presence of aortocoronary bypass graft: Secondary | ICD-10-CM

## 2017-01-23 HISTORY — DX: Presence of aortocoronary bypass graft: Z95.1

## 2017-02-13 ENCOUNTER — Ambulatory Visit: Payer: Medicare Other | Admitting: Vascular Surgery

## 2017-02-13 ENCOUNTER — Encounter (HOSPITAL_COMMUNITY): Payer: Medicare Other

## 2017-02-23 ENCOUNTER — Other Ambulatory Visit: Payer: Self-pay | Admitting: Internal Medicine

## 2017-02-23 ENCOUNTER — Ambulatory Visit: Payer: Medicare Other | Admitting: Adult Health

## 2017-02-25 ENCOUNTER — Ambulatory Visit (INDEPENDENT_AMBULATORY_CARE_PROVIDER_SITE_OTHER): Payer: Medicare Other | Admitting: *Deleted

## 2017-02-25 DIAGNOSIS — I428 Other cardiomyopathies: Secondary | ICD-10-CM

## 2017-02-25 DIAGNOSIS — I429 Cardiomyopathy, unspecified: Secondary | ICD-10-CM

## 2017-02-26 ENCOUNTER — Ambulatory Visit: Payer: Medicare Other | Admitting: Cardiology

## 2017-02-26 NOTE — Progress Notes (Signed)
Remote ICD transmission.   

## 2017-02-27 ENCOUNTER — Encounter: Payer: Self-pay | Admitting: Cardiology

## 2017-03-04 ENCOUNTER — Ambulatory Visit: Payer: Medicare Other | Admitting: Adult Health

## 2017-03-05 ENCOUNTER — Telehealth: Payer: Self-pay | Admitting: Cardiology

## 2017-03-05 NOTE — Telephone Encounter (Signed)
error 

## 2017-03-06 LAB — CUP PACEART REMOTE DEVICE CHECK
Battery Remaining Percentage: 41 %
Battery Voltage: 2.92 V
Brady Statistic RV Percent Paced: 1 %
Date Time Interrogation Session: 20190116070144
HIGH POWER IMPEDANCE MEASURED VALUE: 38 Ohm
Implantable Lead Serial Number: 346356
Implantable Pulse Generator Implant Date: 20120220
Lead Channel Impedance Value: 330 Ohm
Lead Channel Pacing Threshold Amplitude: 0.5 V
Lead Channel Setting Pacing Amplitude: 2.5 V
Lead Channel Setting Sensing Sensitivity: 0.5 mV
MDC IDC LEAD IMPLANT DT: 20120220
MDC IDC LEAD LOCATION: 753860
MDC IDC MSMT BATTERY REMAINING LONGEVITY: 46 mo
MDC IDC MSMT LEADCHNL RV PACING THRESHOLD PULSEWIDTH: 0.5 ms
MDC IDC MSMT LEADCHNL RV SENSING INTR AMPL: 9.9 mV
MDC IDC PG SERIAL: 783786
MDC IDC SET LEADCHNL RV PACING PULSEWIDTH: 0.5 ms

## 2017-03-09 ENCOUNTER — Telehealth: Payer: Self-pay | Admitting: Cardiology

## 2017-03-09 ENCOUNTER — Ambulatory Visit (INDEPENDENT_AMBULATORY_CARE_PROVIDER_SITE_OTHER): Payer: Medicare Other | Admitting: *Deleted

## 2017-03-09 DIAGNOSIS — I429 Cardiomyopathy, unspecified: Secondary | ICD-10-CM

## 2017-03-09 DIAGNOSIS — I428 Other cardiomyopathies: Secondary | ICD-10-CM

## 2017-03-09 NOTE — Telephone Encounter (Signed)
Spoke with pt and reminded pt of remote transmission that is due today. Pt verbalized understanding.   

## 2017-03-10 NOTE — Progress Notes (Signed)
Remote ICD transmission.   

## 2017-03-11 ENCOUNTER — Encounter: Payer: Self-pay | Admitting: Cardiology

## 2017-03-12 LAB — CUP PACEART REMOTE DEVICE CHECK
Battery Remaining Longevity: 46 mo
Battery Remaining Percentage: 40 %
HighPow Impedance: 45 Ohm
Implantable Lead Implant Date: 20120220
Implantable Lead Location: 753860
Implantable Lead Model: 185
Lead Channel Impedance Value: 400 Ohm
Lead Channel Pacing Threshold Pulse Width: 0.5 ms
Lead Channel Setting Pacing Amplitude: 2.5 V
Lead Channel Setting Pacing Pulse Width: 0.5 ms
MDC IDC LEAD SERIAL: 346356
MDC IDC MSMT BATTERY VOLTAGE: 2.92 V
MDC IDC MSMT LEADCHNL RV PACING THRESHOLD AMPLITUDE: 0.5 V
MDC IDC MSMT LEADCHNL RV SENSING INTR AMPL: 11.4 mV
MDC IDC PG IMPLANT DT: 20120220
MDC IDC PG SERIAL: 783786
MDC IDC SESS DTM: 20190128203857
MDC IDC SET LEADCHNL RV SENSING SENSITIVITY: 0.5 mV
MDC IDC STAT BRADY RV PERCENT PACED: 1 %

## 2017-04-16 ENCOUNTER — Telehealth: Payer: Self-pay | Admitting: Cardiology

## 2017-04-16 NOTE — Telephone Encounter (Signed)
Did not need this encounter °

## 2017-05-10 NOTE — Progress Notes (Signed)
Cardiology Office Note   Date:  05/11/2017   ID:  [name] 02-03-1956, MRN [id number]  PCP:  [name], MD  Cardiologist: [name]  Chief Complaint  Patient presents with  . Coronary Artery Disease  . Congestive Heart Failure  . Cardiomyopathy     History of Present Illness: [name] is a 62 y.o. male who presents for ongoing assessment and management of coronary artery disease with history of inferior MI in 1995 treated with PTCA, in 2002 he was treated with a bare-metal stents to the right coronary artery.  Since being seen last, he has had further medical and cardiac complications.  Other history includes CVA with echocardiogram revealing EF of 30-35% but no evidence of thrombus, but there was a right to left shunt at atrial level along with an atrial septal aneurysm.  The patient has ICD in situ, followed by [name].  The patient's MRI at that time showed multiple posterior circulation infarcts.  He had a cardiac catheterization due to reduced ejection fraction, with results revealing There is a 50-60% stenosis of the mid-LAD. There is a diagonal branch arising from this area with ostial 60-70% stenosis. There is an ostial 50% stenosis of the circumflex/intermediate noted. There is a patent stent in the RCA midvessel with 30-40% in-stent restenosis. The proximal and distal vessel both have 50% stenosis noted. The ejection fraction was 35%. Patient had an ICD placed.  The patient was found to have paroxysmal atrial fibrillation, continued on beta-blocker and Coumadin which is managed by his PCP.  The patient is also had a surgical repair of abdominal aortic aneurysm.  Was last seen by [name] on 12/11/2016 and complaint of intermittent claudication right greater than left, was also found to have tongue cancer which required surgical resection.    Consideration for surgical intervention for PAD was mentioned in the note.  He was changed from Zocor  to Lipitor 80 mg daily was to have follow-up lipids and LFTs in 1 month.  He was recommended to be followed by [name] for ICD interrogation. He was cleared to have tongue surgery.  The patient had placement of G-tube on 01/06/2017.  He under went 33 days of radiation therapy.  Post operatively after removal of tongue cancer he had MI, with subsequent cardiac cath: 12/18/2016 by [name] Stable at [facility].   Cardiac Cath 01/07/2017   1. Severe fluoroscopic coronary calcification.  2. 3-vv CAD with LMT ost 80% and distal 50%. (Severe pressure damping down  to 60 mmHg systolic every time engaged even with 48F catheter, therefore  too high risk for more coronary views). LAD heavily calcified prox ~40%, then scattered mild plaques. D1 has  ostial 65% plaque. Cx tortuous origin that gives rise to large, long and bifurcating OM1  (almost a ramus, supplying high lateral wall). This OM1 has a 40% proximal  focal plaque. RCA dominant, and occluded mid.  3. LVEDP = 14 mmHg.  He underwent CABG with LIMA to LAD by [name] on 01/23/2017 at [facility].    The patient went home from the hospital in late December.  He is continued to be followed by oncology at [facility].. He is not eating solid food yet.  He is taking p.o. medications which can be crushed.  He comes today with continued complaints of right leg pain especially in the quadriceps area.  He was followed by [name] with planned  intervention at one point but was also found to have collateral circulation.  On 09/17/2016 the patient did have abdominal aortogram which revealed severe bilateral iliac disease worse on the right side.  He also had significant calcified disease affecting the right common femoral artery followed by an occluded FSA with reconstitution of the distal SFA via collaterals and 1 vessel runoff via the anterior tibial artery.  AAA was also noted prior to having this  repaired as above.  [name] note states that patient would most likely require aortobifemoral bypass with right common femoral artery endarterectomy followed by staged right femoral-popliteal bypass.   Past Medical History:  Diagnosis Date  . AAA (abdominal aortic aneurysm) ([place])   . AICD (automatic cardioverter/defibrillator) present   . CAD (coronary artery disease)    status post diaphragmatic wall infarction in 1995, treated with percutaneous transluminal coronary  angioplasty with subsequent stenting of the right coronary artery in 2002.   [name] CHF (congestive heart failure) ([place])   . GERD (gastroesophageal reflux disease)   . Hyperlipidemia   . Hypertension    Intolerant to MULTIPLE CHOLESTEROL MEDICATIONS.  [name] Myocardial infarction ([place])   . Neuromuscular disorder ([place])   . PFO (patent foramen ovale)   . Pneumonia   . Presence of permanent cardiac pacemaker   . Sleep apnea   . Status post cholecystectomy   . Stroke [facility])     Past Surgical History:  Procedure Laterality Date  . ABDOMINAL AORTOGRAM W/LOWER EXTREMITY N/A 09/17/2016   Procedure: Abdominal Aortogram w/Lower Extremity;  Surgeon: [name], MD;  Location: [facility];  Service: Cardiovascular;  Laterality: N/A;  . ANKLE SURGERY    . AORTA - BILATERAL FEMORAL ARTERY BYPASS GRAFT Bilateral 10/23/2016   Procedure: AORTA BIFEMORAL BYPASS GRAFT USING HEMASHIELD GOLD;  Surgeon: [name], MD;  Location: [place] [facility];  Service: Vascular;  Laterality: Bilateral;  . APPENDECTOMY    . CARDIAC CATHETERIZATION     Stent x1  . CARDIAC DEFIBRILLATOR PLACEMENT     St. Jude  . CHOLECYSTECTOMY  12/02/02  . CORONARY ANGIOPLASTY    . FRACTURE SURGERY     Left leg compound fracture  . HERNIA REPAIR    . INSERT / REPLACE / REMOVE PACEMAKER    . PTCA     stent of the RCA 07/24/00     Current Outpatient Medications  Medication Sig Dispense Refill  . amitriptyline (ELAVIL) 25 MG tablet Take 25 mg by  mouth at bedtime as needed for sleep.     [name] aspirin EC 81 MG tablet Take 1 tablet (81 mg total) by mouth daily. 90 tablet 3  . atorvastatin (LIPITOR) 80 MG tablet Take 1 tablet (80 mg total) by mouth daily. 90 tablet 3  . HYDROcodone-acetaminophen (NORCO/VICODIN) 5-325 MG tablet Take 1-2 tablets by mouth every 6 (six) hours as needed for moderate pain. 30 tablet 0  . magic mouthwash SOLN Take 5 mLs by mouth 4 (four) times daily as needed (for canker sore/mouth pain.).     [name] metoprolol succinate (TOPROL-XL) 50 MG 24 hr tablet Take 25 mg by mouth every evening. Take with or immediately following a meal.     . nitroGLYCERIN (NITROSTAT) 0.4 MG SL tablet Place 1 tablet (0.4 mg total) under the tongue every 5 (five) minutes as needed for chest pain. May repeat 3 times. 25 tablet 3  . warfarin (COUMADIN) 5 MG tablet Take 5 mg by mouth daily. As directed  No current facility-administered medications for this visit.     Allergies:   Patient has no known allergies.    Social History:  The patient  reports that he quit smoking about 6 months ago. He has a 10.00 pack-year smoking history. He has never used smokeless tobacco. He reports that he does not drink alcohol or use drugs.   Family History:  The patient's family history includes Coronary artery disease in his unknown relative; Dementia in his mother; Heart disease in his father.    ROS: All other systems are reviewed and negative. Unless otherwise mentioned in H&P    PHYSICAL EXAM: VS:  BP 124/66   Pulse 71   Ht 6' (1.829 m)   Wt 147 lb (66.7 kg)   BMI 19.94 kg/m  , BMI Body mass index is 19.94 kg/m. GEN: Well nourished, well developed, in no acute distress thin, frail, HEENT: normal tongue with flap noted with dysphasia, and increased saliva requiring him to "slurp" while speaking. Neck: no JVD, carotid bruits, or masses, erythema noted at the base of the throat externally. Cardiac: RRR; no murmurs, rubs, or gallops,no edema    Respiratory:  Cear to auscultation bilaterally, normal work of breathing GI: soft, nontender, nondistended, + BS, G-tube in place. MS: no deformity or atrophy  Skin: warm and dry, no rash.  And graft site to left wrist. Neuro:  Strength and sensation are intact Psych: euthymic mood, full affect   EKG: Normal sinus rhythm with evidence of inferior infarct  Recent Labs: 10/24/2016: ALT 14; Magnesium 2.2 10/30/2016: BUN 6; Creatinine, Ser 0.67; Potassium 3.6; Sodium 134 11/01/2016: Hemoglobin 8.9; Platelets 266    Lipid Panel    Component Value Date/Time   CHOL 148 02/17/2012 1032   TRIG 140.0 02/17/2012 1032   HDL 33.80 (L) 02/17/2012 1032   CHOLHDL 4 02/17/2012 1032   VLDL 28.0 02/17/2012 1032   LDLCALC 86 02/17/2012 1032      Wt Readings from Last 3 Encounters:  05/11/17 147 lb (66.7 kg)  12/11/16 164 lb (74.4 kg)  11/28/16 166 lb (75.3 kg)      Other studies Reviewed: Please see above for extensive discussion of multiple interventions and surgeries.  ASSESSMENT AND PLAN:  1.  Coronary artery disease: Status post myocardial infarction on 11/28, 2019, with cardiac catheterization completed revealing multivessel disease with heavily calcified proximal LAD stenosis, with left main ostial 80% and distal 50%.  There is nonobstructive disease elsewhere.  The patient had subsequent coronary artery bypass grafting with LIMA to LAD 01/23/2017.  He also has stent to the RCA  He continues on atorvastatin and metoprolol daily which he crushes and places through G-tube.  He denies any recurrent chest pain.  Continues essentially deconditioned.  2.  Peripheral arterial disease: Followed by [name].  Recommendations for intervention of aortobifemoral bypass with right common femoral artery endarterectomy followed by staged right femoral-popliteal bypass.  Due to multiple cardiovascular issues, with MI and CABG he did not follow-up with [name].  We will have him be seen for  recommendations of need to proceed with this or treat medically.  3.  Paroxysmal atrial fibrillation: Remains on Coumadin therapy and is followed by Dr. Arelia Sneddon  4.  Ischemic cardiomyopathy with reduced ejection fraction of 25%: Currently not on diuretics.  ICD in situ followed by [name].  5.  Carcinoma of the tongue: Followed by oncology at [facility]. Continues to have G-tube in place.   Current medicines  are reviewed at length with the patient today.    Labs/ tests ordered today include: None  Phill Myron. West Pugh, ANP, AACC   05/11/2017 4:39 PM    [facility] [address] [place], [place] [address] Phone: [phone]; Fax: [phone]

## 2017-05-11 ENCOUNTER — Encounter: Payer: Self-pay | Admitting: Adult Health

## 2017-05-11 ENCOUNTER — Ambulatory Visit: Payer: Medicare Other | Admitting: Adult Health

## 2017-05-11 VITALS — BP 124/66 | HR 71 | Ht 72.0 in | Wt 147.0 lb

## 2017-05-11 DIAGNOSIS — I10 Essential (primary) hypertension: Secondary | ICD-10-CM | POA: Diagnosis not present

## 2017-05-11 DIAGNOSIS — I714 Abdominal aortic aneurysm, without rupture, unspecified: Secondary | ICD-10-CM

## 2017-05-11 DIAGNOSIS — Z95810 Presence of automatic (implantable) cardiac defibrillator: Secondary | ICD-10-CM | POA: Diagnosis not present

## 2017-05-11 DIAGNOSIS — I739 Peripheral vascular disease, unspecified: Secondary | ICD-10-CM

## 2017-05-11 DIAGNOSIS — Z7901 Long term (current) use of anticoagulants: Secondary | ICD-10-CM

## 2017-05-11 DIAGNOSIS — I5022 Chronic systolic (congestive) heart failure: Secondary | ICD-10-CM | POA: Diagnosis not present

## 2017-05-11 DIAGNOSIS — I2510 Atherosclerotic heart disease of native coronary artery without angina pectoris: Secondary | ICD-10-CM | POA: Diagnosis not present

## 2017-05-11 NOTE — Patient Instructions (Signed)
Medication Instructions:  NO CHANGES- Your physician recommends that you continue on your current medications as directed. Please refer to the Current Medication list given to you today. If you need a refill on your cardiac medications before your next appointment, please call your pharmacy.  Follow-Up: Your physician wants you to follow-up in: 3 months with DR [place].   Thank you for choosing [facility] at [facility]!!

## 2017-05-19 ENCOUNTER — Ambulatory Visit: Payer: Medicare Other | Admitting: Cardiovascular Disease

## 2017-05-27 ENCOUNTER — Ambulatory Visit (INDEPENDENT_AMBULATORY_CARE_PROVIDER_SITE_OTHER): Payer: Medicare Other | Admitting: *Deleted

## 2017-05-27 DIAGNOSIS — I429 Cardiomyopathy, unspecified: Secondary | ICD-10-CM

## 2017-05-27 DIAGNOSIS — I428 Other cardiomyopathies: Secondary | ICD-10-CM

## 2017-05-27 NOTE — Progress Notes (Signed)
Remote ICD transmission.   

## 2017-05-28 ENCOUNTER — Encounter: Payer: Self-pay | Admitting: Cardiology

## 2017-06-01 ENCOUNTER — Telehealth: Payer: Self-pay | Admitting: Cardiology

## 2017-06-01 NOTE — Telephone Encounter (Signed)
Spoke w/ pt wife and requested that he send a manual transmission b/c his home monitor has not updated in at least 7 days.

## 2017-06-02 LAB — CUP PACEART REMOTE DEVICE CHECK
Date Time Interrogation Session: 20190423152212
HIGH POWER IMPEDANCE MEASURED VALUE: 43 Ohm
Implantable Lead Serial Number: 346356
Implantable Pulse Generator Implant Date: 20120220
Lead Channel Impedance Value: 350 Ohm
Lead Channel Sensing Intrinsic Amplitude: 10.1 mV
Lead Channel Setting Pacing Amplitude: 2.5 V
Lead Channel Setting Sensing Sensitivity: 0.5 mV
MDC IDC LEAD IMPLANT DT: 20120220
MDC IDC LEAD LOCATION: 753860
MDC IDC MSMT BATTERY REMAINING LONGEVITY: 42 mo
MDC IDC MSMT BATTERY REMAINING PERCENTAGE: 38 %
MDC IDC PG SERIAL: 783786
MDC IDC SET LEADCHNL RV PACING PULSEWIDTH: 0.5 ms
MDC IDC STAT BRADY RV PERCENT PACED: 1 % — AB

## 2017-06-09 ENCOUNTER — Encounter: Payer: Self-pay | Admitting: Cardiovascular Disease

## 2017-06-09 ENCOUNTER — Ambulatory Visit: Payer: Medicare Other | Admitting: Cardiovascular Disease

## 2017-06-09 VITALS — BP 118/82 | HR 75 | Ht 73.0 in | Wt 145.0 lb

## 2017-06-09 DIAGNOSIS — E785 Hyperlipidemia, unspecified: Secondary | ICD-10-CM

## 2017-06-09 DIAGNOSIS — I2510 Atherosclerotic heart disease of native coronary artery without angina pectoris: Secondary | ICD-10-CM

## 2017-06-09 DIAGNOSIS — I5022 Chronic systolic (congestive) heart failure: Secondary | ICD-10-CM

## 2017-06-09 DIAGNOSIS — I739 Peripheral vascular disease, unspecified: Secondary | ICD-10-CM | POA: Diagnosis not present

## 2017-06-09 NOTE — Patient Instructions (Signed)
Medication Instructions: Your physician recommends that you continue on your current medications as directed. Please refer to the Current Medication list given to you today.  If you need a refill on your cardiac medications before your next appointment, please call your pharmacy.   Procedures/Testing: Your physician has requested that you have an ankle brachial index (ABI). During this test an ultrasound and blood pressure cuff are used to evaluate the arteries that supply the arms and legs with blood. Allow thirty minutes for this exam. There are no restrictions or special instructions. This will take place at [place], [address].  Your physician has requested that you have an aorta and iliac duplex. During this test, an ultrasound is used to evaluate blood flow to the aorta and iliac arteries. Allow one hour for this exam. Do not eat after midnight the day before and avoid carbonated beverages. This will be take place at [place], [address].   Follow-Up: Your physician wants you to follow-up in 3 months with [name].    Thank you for choosing [facility] at [facility]!!

## 2017-06-09 NOTE — Progress Notes (Signed)
Cardiology Office Note   Date:  06/09/2017   ID:  [name], [name] Aug 09, 1955, MRN [id number]  PCP:  Trevor Downing, MD  Cardiologist:  [name]  Chief Complaint  Patient presents with  . Follow-up      History of Present Illness: [name] is a 62 y.o. male who is here today for a follow-up visit regarding peripheral arterial disease.    He has known history of coronary artery disease with previous inferior myocardial infarction with prior stent placement to the right coronary artery, ischemic cardiomyopathy status post ICD placement, previous CVA, hypertension, hyperlipidemia, obstructive sleep apnea, small size abdominal aortic aneurysm,  atrial fibrillation and prolonged history of tobacco use.   He was seen last year for severe bilateral leg claudication as well as ulceration affecting the right first metatarsal head. His ABI was critically low on the right side with no detectable flow in the digits. It was low on the left side. There was evidence of inflow disease worse on the right side with bilateral SFA occlusion. The left popliteal artery was occluded. 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  I proceeded with angiography in August which showed moderate sized abdominal aortic aneurysm, severe bilateral iliac disease worse on the right side, significant calcified disease affecting the right common femoral artery followed by an occluded SFA with one-vessel runoff via the anterior tibial artery.  On the left side, there was flush occlusion of the left SFA into the popliteal artery with reconstitution below the knee and the anterior tibial and peroneal arteries.  He underwent aortobifemoral bypass by Dr. Donzetta Matters. He was diagnosed with tongue cancer after that.  This was treated by surgical resection and grafting from the left arm followed by radiation therapy.  The patient lost 50 pounds since last year as a  result of his vascular surgery as well as tongue cancer.  He quit smoking last year.  He denies any chest pain or shortness of breath. He has bilateral cough claudication worse on the right side but no lower extremity ulceration.  Past Medical History:  Diagnosis Date  . AAA (abdominal aortic aneurysm) ([place])   . AICD (automatic cardioverter/defibrillator) present   . CAD (coronary artery disease)    status post diaphragmatic wall infarction in 1995, treated with percutaneous transluminal coronary  angioplasty with subsequent stenting of the right coronary artery in 2002.   [name] CAD in native artery    Status post cardiac catheterization, 01/07/2017 in the setting of myocardial infarction.  90% proximal LAD, patent stent to the RCA, with nonobstructive disease elsewhere.   . CHF (congestive heart failure) ([place])   . GERD (gastroesophageal reflux disease)   . Hx of CABG 01/23/2017   LIMA to [facility]  . Hyperlipidemia   . Hypertension    Intolerant to MULTIPLE CHOLESTEROL MEDICATIONS.  [name] Myocardial infarction ([place])   . Neuromuscular disorder ([place])   . PAD (peripheral artery disease) ([facility])    Bilateral Illiac arteries with Right SFA occlusion.   [name] PFO (patent foramen ovale)   . Pneumonia   . Presence of permanent cardiac pacemaker   . Sleep apnea   . Status post cholecystectomy   . Stroke [facility])     Past Surgical History:  Procedure Laterality Date  . ABDOMINAL AORTOGRAM W/LOWER EXTREMITY N/A 09/17/2016   Procedure: Abdominal Aortogram w/Lower Extremity;  Surgeon: [name], MD;  Location: [place]  CV LAB;  Service: Cardiovascular;  Laterality: N/A;  . ANKLE SURGERY    . AORTA - BILATERAL FEMORAL ARTERY BYPASS GRAFT Bilateral 10/23/2016   Procedure: AORTA BIFEMORAL BYPASS GRAFT USING HEMASHIELD GOLD;  Surgeon: [name], MD;  Location: [place];  Service: Vascular;  Laterality: Bilateral;  . APPENDECTOMY    . CARDIAC CATHETERIZATION     Stent  x1  . CARDIAC DEFIBRILLATOR PLACEMENT     [employer]  . CHOLECYSTECTOMY  12/02/02  . CORONARY ANGIOPLASTY    . FRACTURE SURGERY     Left leg compound fracture  . HERNIA REPAIR    . INSERT / REPLACE / REMOVE PACEMAKER    . PTCA     stent of the RCA 07/24/00     Current Outpatient Medications  Medication Sig Dispense Refill  . amitriptyline (ELAVIL) 25 MG tablet Take 25 mg by mouth at bedtime as needed for sleep.     [name] aspirin EC 81 MG tablet Take 1 tablet (81 mg total) by mouth daily. 90 tablet 3  . atorvastatin (LIPITOR) 80 MG tablet Take 1 tablet (80 mg total) by mouth daily. 90 tablet 3  . magic mouthwash SOLN Take 5 mLs by mouth 4 (four) times daily as needed (for canker sore/mouth pain.).     [name] metoprolol succinate (TOPROL-XL) 50 MG 24 hr tablet Take 25 mg by mouth every evening. Take with or immediately following a meal.     . nitroGLYCERIN (NITROSTAT) 0.4 MG SL tablet Place 1 tablet (0.4 mg total) under the tongue every 5 (five) minutes as needed for chest pain. May repeat 3 times. 25 tablet 3  . warfarin (COUMADIN) 5 MG tablet Take 5 mg by mouth daily. As directed     No current facility-administered medications for this visit.     Allergies:   Patient has no known allergies.    Social History:  The patient  reports that he quit smoking about 7 months ago. He has a 10.00 pack-year smoking history. He has never used smokeless tobacco. He reports that he does not drink alcohol or use drugs.   Family History:  The patient's family history includes Coronary artery disease in his unknown relative; Dementia in his mother; Heart disease in his father.    ROS:  Please see the history of present illness.   Otherwise, review of systems are positive for none.   All other systems are reviewed and negative.    PHYSICAL EXAM: VS:  BP 118/82   Pulse 75   Ht 6\' 1"  (1.854 m)   Wt 145 lb (65.8 kg)   SpO2 95%   BMI 19.13 kg/m  , BMI Body mass index is 19.13 kg/m. GEN: Well nourished,  well developed, in no acute distress  HEENT: normal  Neck: no JVD, carotid bruits, or masses Cardiac: RRR; no murmurs, rubs, or gallops,no edema  Respiratory:  clear to auscultation bilaterally, normal work of breathing GI: soft, nontender, nondistended, + BS MS: no deformity or atrophy  Skin: warm and dry, no rash Neuro:  Strength and sensation are intact Psych: euthymic mood, full affect Vascular: Distal pulses are not palpable.  He has dependent rubor on the right side.  No ulceration.  EKG:  EKG is not ordered today.    Recent Labs: 10/24/2016: ALT 14; Magnesium 2.2 10/30/2016: BUN 6; Creatinine, Ser 0.67; Potassium 3.6; Sodium 134 11/01/2016: Hemoglobin 8.9; Platelets 266    Lipid Panel    Component Value Date/Time   CHOL  148 02/17/2012 1032   TRIG 140.0 02/17/2012 1032   HDL 33.80 (L) 02/17/2012 1032   CHOLHDL 4 02/17/2012 1032   VLDL 28.0 02/17/2012 1032   LDLCALC 86 02/17/2012 1032      Wt Readings from Last 3 Encounters:  06/09/17 145 lb (65.8 kg)  05/11/17 147 lb (66.7 kg)  12/11/16 164 lb (74.4 kg)       No flowsheet data found.    ASSESSMENT AND PLAN:  1.  Peripheral arterial disease with previous critical limb ischemia affecting the right lower extremity.  Status post aortobifemoral bypass last year with common femoral artery endarterectomy.  The patient has known occlusive disease affecting the SFA and popliteal arteries bilaterally.  I am going to obtain an ABI and aortoiliac duplex.   He might require further revascularization but it is probably better to wait until he is fully recovered from his tongue cancer and hopefully can gain some of the weight back. There is no evidence of critical limb ischemia at the present time.  The patient quit smoking last year.  2. Hyperlipidemia: Continue treatment with atorvastatin with a target LDL of less than 70.  3.  Chronic systolic heart failure: He appears to be euvolemic  4.  Coronary artery disease:  Currently with no anginal symptoms.    Disposition:   FU with me in 3 months  Signed,  Kathlyn Sacramento, MD  06/09/2017 9:59 AM    [place]

## 2017-06-18 ENCOUNTER — Ambulatory Visit (HOSPITAL_BASED_OUTPATIENT_CLINIC_OR_DEPARTMENT_OTHER)
Admission: RE | Admit: 2017-06-18 | Discharge: 2017-06-18 | Disposition: A | Discharge status: Home / Self Care | Payer: Medicare Other | Source: Ambulatory Visit | Attending: Cardiovascular Disease | Admitting: Cardiovascular Disease

## 2017-06-18 ENCOUNTER — Ambulatory Visit (HOSPITAL_COMMUNITY)
Admission: RE | Admit: 2017-06-18 | Discharge: 2017-06-18 | Disposition: A | Discharge status: Home / Self Care | Payer: Medicare Other | Source: Ambulatory Visit | Attending: Cardiovascular Disease | Admitting: Cardiovascular Disease

## 2017-06-18 DIAGNOSIS — Z95828 Presence of other vascular implants and grafts: Secondary | ICD-10-CM | POA: Insufficient documentation

## 2017-06-18 DIAGNOSIS — I739 Peripheral vascular disease, unspecified: Secondary | ICD-10-CM | POA: Insufficient documentation

## 2017-06-18 DIAGNOSIS — E785 Hyperlipidemia, unspecified: Secondary | ICD-10-CM | POA: Insufficient documentation

## 2017-06-18 DIAGNOSIS — Z87891 Personal history of nicotine dependence: Secondary | ICD-10-CM | POA: Diagnosis not present

## 2017-06-18 DIAGNOSIS — I77811 Abdominal aortic ectasia: Secondary | ICD-10-CM | POA: Insufficient documentation

## 2017-06-18 DIAGNOSIS — I10 Essential (primary) hypertension: Secondary | ICD-10-CM | POA: Diagnosis not present

## 2017-06-18 DIAGNOSIS — Z8673 Personal history of transient ischemic attack (TIA), and cerebral infarction without residual deficits: Secondary | ICD-10-CM | POA: Diagnosis not present

## 2017-07-17 NOTE — Progress Notes (Signed)
This encounter was created in error - please disregard.

## 2017-07-21 ENCOUNTER — Encounter: Payer: Self-pay | Admitting: Cardiovascular Disease

## 2017-07-21 ENCOUNTER — Ambulatory Visit (INDEPENDENT_AMBULATORY_CARE_PROVIDER_SITE_OTHER): Payer: Medicare Other | Admitting: Cardiovascular Disease

## 2017-07-21 VITALS — BP 112/75 | HR 76 | Ht 73.0 in | Wt 140.2 lb

## 2017-07-21 DIAGNOSIS — I5022 Chronic systolic (congestive) heart failure: Secondary | ICD-10-CM

## 2017-07-21 DIAGNOSIS — I739 Peripheral vascular disease, unspecified: Secondary | ICD-10-CM

## 2017-07-21 DIAGNOSIS — I480 Paroxysmal atrial fibrillation: Secondary | ICD-10-CM

## 2017-07-21 DIAGNOSIS — E785 Hyperlipidemia, unspecified: Secondary | ICD-10-CM | POA: Diagnosis not present

## 2017-07-21 NOTE — Progress Notes (Signed)
Cardiology Office Note   Date:  07/21/2017   ID:  [name] 28-Sep-1955, MRN [id number]  PCP:  [name], MD  Cardiologist:  [name]  No chief complaint on file.     History of Present Illness: [name] is a 62 y.o. male who is here today for a follow-up visit regarding peripheral arterial disease.    He has known history of coronary artery disease with previous inferior myocardial infarction with prior stent placement to the right coronary artery, ischemic cardiomyopathy status post ICD placement, previous CVA, hypertension, hyperlipidemia, obstructive sleep apnea, small size abdominal aortic aneurysm,  atrial fibrillation on Warfarin and prolonged history of tobacco use.   He was seen last year for severe bilateral leg claudication as well as ulceration affecting the right first metatarsal head. His ABI was critically low on the right side with no detectable flow in the digits. It was low on the left side. There was evidence of inflow disease worse on the right side with bilateral SFA occlusion. The left popliteal artery was occluded. 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  I proceeded with angiography in August which showed moderate sized abdominal aortic aneurysm, severe bilateral iliac disease worse on the right side, significant calcified disease affecting the right common femoral artery followed by an occluded SFA with one-vessel runoff via the anterior tibial artery.  On the left side, there was flush occlusion of the left SFA into the popliteal artery with reconstitution below the knee and the anterior tibial and peroneal arteries.  He underwent aortobifemoral bypass by Dr. Donzetta Matters. He was diagnosed with tongue cancer after that.  This was treated by surgical resection and grafting from the left arm followed by radiation therapy.  The patient lost 50 pounds since last year as a result of his  vascular surgery as well as tongue cancer.  He quit smoking last year.    He underwent repeat vascular studies recently which showed an ABI in the 0.5 range on the left side and not detectable on the right side.  Aortoiliac duplex showed patent aortobifemoral bypass.  In spite of his vascular testing on the right, he has no rest pain or lower extremity ulceration.  He does have right calf claudication. His appetite continues to be poor and he has not been able to gain any weight.  He reports stable shortness of breath.  Past Medical History:  Diagnosis Date  . AAA (abdominal aortic aneurysm) ([place])   . AICD (automatic cardioverter/defibrillator) present   . CAD (coronary artery disease)    status post diaphragmatic wall infarction in 1995, treated with percutaneous transluminal coronary  angioplasty with subsequent stenting of the right coronary artery in 2002.   [name] CAD in native artery    Status post cardiac catheterization, 01/07/2017 in the setting of myocardial infarction.  90% proximal LAD, patent stent to the RCA, with nonobstructive disease elsewhere.   . CHF (congestive heart failure) ([place])   . GERD (gastroesophageal reflux disease)   . Hx of CABG 01/23/2017   LIMA to [facility]  . Hyperlipidemia   . Hypertension    Intolerant to MULTIPLE CHOLESTEROL MEDICATIONS.  [name] Myocardial infarction ([place])   . Neuromuscular disorder ([place])   . PAD (peripheral artery disease) ([facility])    Bilateral Illiac arteries with Right SFA occlusion.   [name] PFO (patent foramen ovale)   . Pneumonia   . Presence  of permanent cardiac pacemaker   . Sleep apnea   . Status post cholecystectomy   . Stroke [facility])     Past Surgical History:  Procedure Laterality Date  . ABDOMINAL AORTOGRAM W/LOWER EXTREMITY N/A 09/17/2016   Procedure: Abdominal Aortogram w/Lower Extremity;  Surgeon: [name], MD;  Location: [place] CV LAB;  Service: Cardiovascular;  Laterality: N/A;  . ANKLE SURGERY      . AORTA - BILATERAL FEMORAL ARTERY BYPASS GRAFT Bilateral 10/23/2016   Procedure: AORTA BIFEMORAL BYPASS GRAFT USING HEMASHIELD GOLD;  Surgeon: [name], MD;  Location: [place];  Service: Vascular;  Laterality: Bilateral;  . APPENDECTOMY    . CARDIAC CATHETERIZATION     Stent x1  . CARDIAC DEFIBRILLATOR PLACEMENT     [employer]. Jude  . CHOLECYSTECTOMY  12/02/02  . CORONARY ANGIOPLASTY    . FRACTURE SURGERY     Left leg compound fracture  . HERNIA REPAIR    . INSERT / REPLACE / REMOVE PACEMAKER    . PTCA     stent of the RCA 07/24/00     Current Outpatient Medications  Medication Sig Dispense Refill  . amitriptyline (ELAVIL) 25 MG tablet Take 25 mg by mouth at bedtime as needed for sleep.     [name] aspirin EC 81 MG tablet Take 1 tablet (81 mg total) by mouth daily. 90 tablet 3  . atorvastatin (LIPITOR) 80 MG tablet Take 1 tablet (80 mg total) by mouth daily. 90 tablet 3  . magic mouthwash SOLN Take 5 mLs by mouth 4 (four) times daily as needed (for canker sore/mouth pain.).     [name] metoprolol succinate (TOPROL-XL) 50 MG 24 hr tablet Take 25 mg by mouth every evening. Take with or immediately following a meal.     . nitroGLYCERIN (NITROSTAT) 0.4 MG SL tablet Place 1 tablet (0.4 mg total) under the tongue every 5 (five) minutes as needed for chest pain. May repeat 3 times. 25 tablet 3  . warfarin (COUMADIN) 5 MG tablet Take 5 mg by mouth daily. As directed     No current facility-administered medications for this visit.     Allergies:   Patient has no known allergies.    Social History:  The patient  reports that he quit smoking about 8 months ago. He has a 10.00 pack-year smoking history. He has never used smokeless tobacco. He reports that he does not drink alcohol or use drugs.   Family History:  The patient's family history includes Coronary artery disease in his unknown relative; Dementia in his mother; Heart disease in his father.    ROS:  Please see the history of present  illness.   Otherwise, review of systems are positive for none.   All other systems are reviewed and negative.    PHYSICAL EXAM: VS:  BP 112/75   Pulse 76   Ht 6\' 1"  (1.854 m)   Wt 140 lb 3.2 oz (63.6 kg)   BMI 18.50 kg/m  , BMI Body mass index is 18.5 kg/m. GEN: Well nourished, well developed, in no acute distress  HEENT: normal  Neck: no JVD, carotid bruits, or masses Cardiac: RRR; no murmurs, rubs, or gallops,no edema  Respiratory:  clear to auscultation bilaterally, normal work of breathing GI: soft, nontender, nondistended, + BS MS: no deformity or atrophy  Skin: warm and dry, no rash Neuro:  Strength and sensation are intact Psych: euthymic mood, full affect Vascular: Distal pulses are not palpable.  He has dependent  rubor on the right side.  No ulceration.  EKG:  EKG is not ordered today.    Recent Labs: 10/24/2016: ALT 14; Magnesium 2.2 10/30/2016: BUN 6; Creatinine, Ser 0.67; Potassium 3.6; Sodium 134 11/01/2016: Hemoglobin 8.9; Platelets 266    Lipid Panel    Component Value Date/Time   CHOL 148 02/17/2012 1032   TRIG 140.0 02/17/2012 1032   HDL 33.80 (L) 02/17/2012 1032   CHOLHDL 4 02/17/2012 1032   VLDL 28.0 02/17/2012 1032   LDLCALC 86 02/17/2012 1032      Wt Readings from Last 3 Encounters:  07/21/17 140 lb 3.2 oz (63.6 kg)  06/09/17 145 lb (65.8 kg)  05/11/17 147 lb (66.7 kg)       No flowsheet data found.    ASSESSMENT AND PLAN:  1.  Peripheral arterial disease with previous critical limb ischemia affecting the right lower extremity.  Status post aortobifemoral bypass last year with common femoral artery endarterectomy.   Current symptoms include severe right calf claudication but fortunately he has no rest pain or lower extremity ulceration.  The patient will need angiography in the near future but his main issue continues to be poor appetite and poor nutritional status.  The patient does not feel that his symptoms on the right side are  severe enough to require immediate attention. I will plan on reevaluating him in 3 months.  He will likely require angiography via the left brachial artery.  2. Hyperlipidemia: Continue treatment with atorvastatin with a target LDL of less than 70.  3.  Chronic systolic heart failure: He appears to be euvolemic  4.  Coronary artery disease: Currently with no anginal symptoms.  5.  Paroxysmal atrial fibrillation: On long-term anticoagulation with warfarin.    Disposition:   FU with me in 3 months  Signed,  Kathlyn Sacramento, MD  07/21/2017 10:32 AM    [place]

## 2017-07-21 NOTE — Patient Instructions (Signed)
Medication Instructions: Your physician recommends that you continue on your current medications as directed. Please refer to the Current Medication list given to you today.  If you need a refill on your cardiac medications before your next appointment, please call your pharmacy.   Follow-Up: Your physician wants you to follow-up in 3 months with Dr. Arida.   Thank you for choosing [facility]!!      

## 2017-08-05 ENCOUNTER — Other Ambulatory Visit: Payer: Self-pay | Admitting: *Deleted

## 2017-08-05 DIAGNOSIS — I739 Peripheral vascular disease, unspecified: Secondary | ICD-10-CM

## 2017-08-05 NOTE — Progress Notes (Signed)
HPI: FU CM, CAD (previous PCI of RCA; LIMA to LAD) and prior CVA. He was admitted with a stroke with symptoms of confusion and ataxia in Sept of 2011. He had a TEE which showed an ejection fraction of 30-35% with no evident thrombus. 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 His MRI showed multiple posterior circulation infarct. Patient had an ICD placed February 2012. Also with PAF and prior repair of AAA. In November 2018 patient underwent tracheotomy, hemiglossectomy and tongue base resection, left selective neck dissection and left radial forearm free flap tongue reconstruction for tongue cancer.  Patient had PEG placement followed by atrial fibrillation and myocardial infarction.  Echocardiogram November 2018 showed ejection fraction 20-25%.  Cardiac catheterization November 2018 showed 80% left main, 65% first diagonal.  In December 2018 patient underwent off pump coronary artery bypass and graft with LIMA to the LAD (pt felt to be too high risk for sternotomy).  Followed by [name] for peripheral vascular disease.  He has right calf claudication and angiography is planned for the future but it was felt his nutritional status needed to improve first.  Since last seen, patient denies dyspnea, chest pain or syncope.  He states he had a PET scan recently that showed metastatic disease to his lungs.  He is scheduled to see oncology.  He continues to have claudication.  Current Outpatient Medications  Medication Sig Dispense Refill  . amitriptyline (ELAVIL) 25 MG tablet Take 25 mg by mouth at bedtime as needed for sleep.     [name] aspirin EC 81 MG tablet Take 1 tablet (81 mg total) by mouth daily. 90 tablet 3  . atorvastatin (LIPITOR) 80 MG tablet Take 1 tablet (80 mg total) by mouth daily. 90 tablet 3  . magic mouthwash SOLN Take 5 mLs by mouth 4 (four) times daily as needed (for canker sore/mouth pain.).     [name] metoprolol succinate (TOPROL-XL) 50  MG 24 hr tablet Take 25 mg by mouth every evening. Take with or immediately following a meal.     . nitroGLYCERIN (NITROSTAT) 0.4 MG SL tablet Place 1 tablet (0.4 mg total) under the tongue every 5 (five) minutes as needed for chest pain. May repeat 3 times. 25 tablet 3  . warfarin (COUMADIN) 5 MG tablet Take 5 mg by mouth daily. As directed     No current facility-administered medications for this visit.      Past Medical History:  Diagnosis Date  . AAA (abdominal aortic aneurysm) ([place])   . AICD (automatic cardioverter/defibrillator) present   . CAD (coronary artery disease)    status post diaphragmatic wall infarction in 1995, treated with percutaneous transluminal coronary  angioplasty with subsequent stenting of the right coronary artery in 2002.   [name] CAD in native artery    Status post cardiac catheterization, 01/07/2017 in the setting of myocardial infarction.  90% proximal LAD, patent stent to the RCA, with nonobstructive disease elsewhere.   . CHF (congestive heart failure) ([place])   . GERD (gastroesophageal reflux disease)   . Hx of CABG 01/23/2017   LIMA to [facility]  . Hyperlipidemia   . Hypertension    Intolerant to MULTIPLE CHOLESTEROL MEDICATIONS.  [name] Myocardial infarction ([place])   . Neuromuscular disorder ([place])   . PAD (peripheral artery disease) ([facility])    Bilateral Illiac arteries with Right SFA occlusion.   [name] PFO (patent foramen ovale)   .  Pneumonia   . Presence of permanent cardiac pacemaker   . Sleep apnea   . Status post cholecystectomy   . Stroke [facility])     Past Surgical History:  Procedure Laterality Date  . ABDOMINAL AORTOGRAM W/LOWER EXTREMITY N/A 09/17/2016   Procedure: Abdominal Aortogram w/Lower Extremity;  Surgeon: [name], MD;  Location: [place] CV LAB;  Service: Cardiovascular;  Laterality: N/A;  . ANKLE SURGERY    . AORTA - BILATERAL FEMORAL ARTERY BYPASS GRAFT Bilateral 10/23/2016   Procedure: AORTA BIFEMORAL BYPASS  GRAFT USING HEMASHIELD GOLD;  Surgeon: [name], MD;  Location: [place];  Service: Vascular;  Laterality: Bilateral;  . APPENDECTOMY    . CARDIAC CATHETERIZATION     Stent x1  . CARDIAC DEFIBRILLATOR PLACEMENT     St. Jude  . CHOLECYSTECTOMY  12/02/02  . CORONARY ANGIOPLASTY    . FRACTURE SURGERY     Left leg compound fracture  . HERNIA REPAIR    . INSERT / REPLACE / REMOVE PACEMAKER    . PTCA     stent of the RCA 07/24/00    Social History   Socioeconomic History  . Marital status: Married    Spouse name: Not on file  . Number of children: Not on file  . Years of education: Not on file  . Highest education level: Not on file  Occupational History  . Not on file  Social Needs  . Financial resource strain: Not on file  . Food insecurity:    Worry: Not on file    Inability: Not on file  . Transportation needs:    Medical: Not on file    Non-medical: Not on file  Tobacco Use  . Smoking status: Former Smoker    Packs/day: 0.25    Years: 40.00    Pack years: 10.00    Last attempt to quit: 10/22/2016    Years since quitting: 0.8  . Smokeless tobacco: Never Used  Substance and Sexual Activity  . Alcohol use: No  . Drug use: No  . Sexual activity: Not on file  Lifestyle  . Physical activity:    Days per week: Not on file    Minutes per session: Not on file  . Stress: Not on file  Relationships  . Social connections:    Talks on phone: Not on file    Gets together: Not on file    Attends religious service: Not on file    Active member of club or organization: Not on file    Attends meetings of clubs or organizations: Not on file    Relationship status: Not on file  . Intimate partner violence:    Fear of current or ex partner: Not on file    Emotionally abused: Not on file    Physically abused: Not on file    Forced sexual activity: Not on file  Other Topics Concern  . Not on file  Social History Narrative  . Not on file    Family History    Problem Relation Age of Onset  . Coronary artery disease Unknown   . Dementia Mother        on hospice as of 05/12/2016  . Heart disease Father     ROS: Weak, no fevers or chills, productive cough, hemoptysis, dysphasia, odynophagia, melena, hematochezia, dysuria, hematuria, rash, seizure activity, orthopnea, PND, pedal edema, claudication. Remaining systems are negative.  Physical Exam: Well-developed chronically ill appearing in no acute distress.  Skin is warm  and dry.  HEENT s/p resection of tongue Neck is supple.  Chest is clear to auscultation with normal expansion.  Cardiovascular exam is regular rate and rhythm.  Abdominal exam nontender or distended. No masses palpated. Gtube in place Extremities show no edema. neuro grossly intact  A/P  1 coronary artery disease status post coronary artery bypass and graft-plan to continue medical therapy with aspirin and statin.  Patient is slowly recovering from multiple recent issues.  2 ischemic cardiomyopathy-plan to continue Toprol.  Blood pressure will not allow ACE inhibitor or ARB at this point but we will consider in the future if blood pressure improves.  Repeat echocardiogram to see if LV function has improved following revascularization.  3 hypertension-blood pressure is borderline.  Continue Toprol and follow.  4 hyperlipidemia-continue statin.  5 prior ICD-followed by [name].  6 peripheral vascular disease-followed by [name].  Continues with claudication.  [name] was allowing patient to recover from multiple recent surgeries before considering further intervention.  7 prior abdominal aortic aneurysm repair  8 paroxysmal atrial fibrillation-patient is in sinus rhythm today.  Continue Toprol for rate control if atrial fibrillation recurs.  Continue Coumadin.  Not on DOAC due to financial issues.  9 metastatic tongue cancer-Per oncology.    Kirk Ruths, MD

## 2017-08-14 ENCOUNTER — Ambulatory Visit: Payer: Medicare Other | Admitting: Cardiology

## 2017-08-14 ENCOUNTER — Encounter: Payer: Self-pay | Admitting: Cardiology

## 2017-08-14 VITALS — BP 92/58 | HR 73 | Ht 73.0 in | Wt 137.0 lb

## 2017-08-14 DIAGNOSIS — I428 Other cardiomyopathies: Secondary | ICD-10-CM

## 2017-08-14 DIAGNOSIS — I2510 Atherosclerotic heart disease of native coronary artery without angina pectoris: Secondary | ICD-10-CM | POA: Diagnosis not present

## 2017-08-14 DIAGNOSIS — I10 Essential (primary) hypertension: Secondary | ICD-10-CM

## 2017-08-14 DIAGNOSIS — E7800 Pure hypercholesterolemia, unspecified: Secondary | ICD-10-CM | POA: Diagnosis not present

## 2017-08-14 DIAGNOSIS — I429 Cardiomyopathy, unspecified: Secondary | ICD-10-CM

## 2017-08-14 NOTE — Patient Instructions (Signed)
Medication Instructions:   NO CHANGE  Testing/Procedures:  Your physician has requested that you have an echocardiogram. Echocardiography is a painless test that uses sound waves to create images of your heart. It provides your doctor with information about the size and shape of your heart and how well your heart's chambers and valves are working. This procedure takes approximately one hour. There are no restrictions for this procedure.    Follow-Up:  Your physician recommends that you schedule a follow-up appointment in: 3 MONTHS WITH DR CRENSHAW   If you need a refill on your cardiac medications before your next appointment, please call your pharmacy.    

## 2017-08-21 ENCOUNTER — Other Ambulatory Visit (HOSPITAL_COMMUNITY): Payer: Medicare Other

## 2017-08-26 ENCOUNTER — Ambulatory Visit (INDEPENDENT_AMBULATORY_CARE_PROVIDER_SITE_OTHER): Payer: Medicare Other | Admitting: *Deleted

## 2017-08-26 ENCOUNTER — Other Ambulatory Visit: Payer: Self-pay

## 2017-08-26 ENCOUNTER — Ambulatory Visit (HOSPITAL_COMMUNITY): Payer: Medicare Other | Attending: Cardiology

## 2017-08-26 DIAGNOSIS — Z8249 Family history of ischemic heart disease and other diseases of the circulatory system: Secondary | ICD-10-CM | POA: Diagnosis not present

## 2017-08-26 DIAGNOSIS — I429 Cardiomyopathy, unspecified: Secondary | ICD-10-CM

## 2017-08-26 DIAGNOSIS — I110 Hypertensive heart disease with heart failure: Secondary | ICD-10-CM | POA: Diagnosis not present

## 2017-08-26 DIAGNOSIS — I428 Other cardiomyopathies: Secondary | ICD-10-CM

## 2017-08-26 DIAGNOSIS — Z87891 Personal history of nicotine dependence: Secondary | ICD-10-CM | POA: Diagnosis not present

## 2017-08-26 DIAGNOSIS — Z85810 Personal history of malignant neoplasm of tongue: Secondary | ICD-10-CM | POA: Insufficient documentation

## 2017-08-26 DIAGNOSIS — Z8673 Personal history of transient ischemic attack (TIA), and cerebral infarction without residual deficits: Secondary | ICD-10-CM | POA: Insufficient documentation

## 2017-08-26 DIAGNOSIS — I2510 Atherosclerotic heart disease of native coronary artery without angina pectoris: Secondary | ICD-10-CM | POA: Diagnosis not present

## 2017-08-26 DIAGNOSIS — I071 Rheumatic tricuspid insufficiency: Secondary | ICD-10-CM | POA: Insufficient documentation

## 2017-08-26 DIAGNOSIS — I714 Abdominal aortic aneurysm, without rupture: Secondary | ICD-10-CM | POA: Insufficient documentation

## 2017-08-26 DIAGNOSIS — Z95810 Presence of automatic (implantable) cardiac defibrillator: Secondary | ICD-10-CM | POA: Diagnosis not present

## 2017-08-26 DIAGNOSIS — E785 Hyperlipidemia, unspecified: Secondary | ICD-10-CM | POA: Insufficient documentation

## 2017-08-26 DIAGNOSIS — I252 Old myocardial infarction: Secondary | ICD-10-CM | POA: Diagnosis not present

## 2017-08-26 DIAGNOSIS — I739 Peripheral vascular disease, unspecified: Secondary | ICD-10-CM | POA: Insufficient documentation

## 2017-08-26 DIAGNOSIS — I509 Heart failure, unspecified: Secondary | ICD-10-CM | POA: Diagnosis not present

## 2017-08-26 DIAGNOSIS — Z951 Presence of aortocoronary bypass graft: Secondary | ICD-10-CM | POA: Insufficient documentation

## 2017-08-26 DIAGNOSIS — G4733 Obstructive sleep apnea (adult) (pediatric): Secondary | ICD-10-CM | POA: Insufficient documentation

## 2017-08-26 NOTE — Progress Notes (Signed)
Remote ICD transmission.   

## 2017-08-27 ENCOUNTER — Encounter: Payer: Self-pay | Admitting: Cardiology

## 2017-09-01 ENCOUNTER — Ambulatory Visit: Payer: Medicare Other | Admitting: Cardiovascular Disease

## 2017-09-06 LAB — CUP PACEART REMOTE DEVICE CHECK
Battery Remaining Longevity: 41 mo
Battery Remaining Percentage: 36 %
Brady Statistic RV Percent Paced: 1 %
HIGH POWER IMPEDANCE MEASURED VALUE: 43 Ohm
Implantable Lead Implant Date: 20120220
Implantable Lead Model: 185
Lead Channel Impedance Value: 350 Ohm
Lead Channel Pacing Threshold Pulse Width: 0.5 ms
Lead Channel Sensing Intrinsic Amplitude: 10.1 mV
Lead Channel Setting Pacing Amplitude: 2.5 V
Lead Channel Setting Pacing Pulse Width: 0.5 ms
MDC IDC LEAD LOCATION: 753860
MDC IDC LEAD SERIAL: 346356
MDC IDC MSMT BATTERY VOLTAGE: 2.92 V
MDC IDC MSMT LEADCHNL RV PACING THRESHOLD AMPLITUDE: 0.5 V
MDC IDC PG IMPLANT DT: 20120220
MDC IDC PG SERIAL: 783786
MDC IDC SESS DTM: 20190717060311
MDC IDC SET LEADCHNL RV SENSING SENSITIVITY: 0.5 mV

## 2017-10-27 ENCOUNTER — Ambulatory Visit: Payer: Medicare Other | Admitting: Cardiovascular Disease

## 2017-11-10 ENCOUNTER — Ambulatory Visit (INDEPENDENT_AMBULATORY_CARE_PROVIDER_SITE_OTHER): Payer: Medicare Other | Admitting: Cardiovascular Disease

## 2017-11-10 ENCOUNTER — Encounter: Payer: Self-pay | Admitting: Cardiovascular Disease

## 2017-11-10 VITALS — BP 102/64 | HR 75 | Ht 73.0 in | Wt 136.6 lb

## 2017-11-10 DIAGNOSIS — E785 Hyperlipidemia, unspecified: Secondary | ICD-10-CM

## 2017-11-10 DIAGNOSIS — I480 Paroxysmal atrial fibrillation: Secondary | ICD-10-CM

## 2017-11-10 DIAGNOSIS — I5022 Chronic systolic (congestive) heart failure: Secondary | ICD-10-CM | POA: Diagnosis not present

## 2017-11-10 DIAGNOSIS — I2510 Atherosclerotic heart disease of native coronary artery without angina pectoris: Secondary | ICD-10-CM

## 2017-11-10 DIAGNOSIS — I739 Peripheral vascular disease, unspecified: Secondary | ICD-10-CM | POA: Diagnosis not present

## 2017-11-10 NOTE — Patient Instructions (Addendum)
Medication Instructions:  Your physician recommends that you continue on your current medications as directed. Please refer to the Current Medication list given to you today.  Labwork: None ordered  Testing/Procedures: None ordered  Follow-Up: Your physician recommends that you schedule a follow-up appointment in: 2 months with Dr.Arida   Any Other Special Instructions Will Be Listed Below (If Applicable).     If you need a refill on your cardiac medications before your next appointment, please call your pharmacy.

## 2017-11-10 NOTE — Progress Notes (Signed)
Cardiology Office Note   Date:  11/11/2017   ID:  Trevor, [name] [birth year], MRN [id number]  PCP:  [name], MD  Cardiologist:  [name]  No chief complaint on file.     History of Present Illness: [name] is a 62 y.o. male who is here today for a follow-up visit regarding peripheral arterial disease.    He has known history of coronary artery disease with previous inferior myocardial infarction with prior stent placement to the right coronary artery, ischemic cardiomyopathy status post ICD placement, previous CVA, hypertension, hyperlipidemia, obstructive sleep apnea, small size abdominal aortic aneurysm,  atrial fibrillation on Warfarin and prolonged history of tobacco use.   He was seen last year for severe bilateral leg claudication as well as ulceration affecting the right first metatarsal head. His ABI was critically low on the right side with no detectable flow in the digits. It was low on the left side. There was evidence of inflow disease worse on the right side with bilateral SFA occlusion. The left popliteal artery was occluded. 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  I proceeded with angiography in August which showed moderate sized abdominal aortic aneurysm, severe bilateral iliac disease worse on the right side, significant calcified disease affecting the right common femoral artery followed by an occluded SFA with one-vessel runoff via the anterior tibial artery.  On the left side, there was flush occlusion of the left SFA into the popliteal artery with reconstitution below the knee and the anterior tibial and peroneal arteries.  He underwent aortobifemoral bypass by Dr. Donzetta Matters. He was diagnosed with tongue cancer after that.  This was treated by surgical resection and grafting from the left arm followed by radiation therapy.  The patient lost 50 pounds since last year as a result of his  vascular surgery as well as tongue cancer.  He quit smoking last year.    He underwent repeat vascular studies in June which showed an ABI in the 0.5 range on the left side and not detectable on the right side.  Aortoiliac duplex showed patent aortobifemoral bypass.    The patient was diagnosed recently with 3 lung metastases and currently undergoing immunotherapy.  He continues to complain of severe right leg claudication and occasional rest pain with no ulceration.  TakeCare  Past Medical History:  Diagnosis Date  . AAA (abdominal aortic aneurysm) ([place])   . AICD (automatic cardioverter/defibrillator) present   . CAD (coronary artery disease)    status post diaphragmatic wall infarction in 1995, treated with percutaneous transluminal coronary  angioplasty with subsequent stenting of the right coronary artery in 2002.   [name] CAD in native artery    Status post cardiac catheterization, 01/07/2017 in the setting of myocardial infarction.  90% proximal LAD, patent stent to the RCA, with nonobstructive disease elsewhere.   . CHF (congestive heart failure) ([place])   . GERD (gastroesophageal reflux disease)   . Hx of CABG 01/23/2017   LIMA to [facility]  . Hyperlipidemia   . Hypertension    Intolerant to MULTIPLE CHOLESTEROL MEDICATIONS.  [name] Myocardial infarction ([place])   . Neuromuscular disorder ([place])   . PAD (peripheral artery disease) ([facility])    Bilateral Illiac arteries with Right SFA occlusion.   [name] PFO (patent foramen ovale)   . Pneumonia   . Presence of permanent cardiac pacemaker   . Sleep apnea   . Status  post cholecystectomy   . Stroke [facility])     Past Surgical History:  Procedure Laterality Date  . ABDOMINAL AORTOGRAM W/LOWER EXTREMITY N/A 09/17/2016   Procedure: Abdominal Aortogram w/Lower Extremity;  Surgeon: [name], MD;  Location: [place] CV LAB;  Service: Cardiovascular;  Laterality: N/A;  . ANKLE SURGERY    . AORTA - BILATERAL FEMORAL ARTERY  BYPASS GRAFT Bilateral 10/23/2016   Procedure: AORTA BIFEMORAL BYPASS GRAFT USING HEMASHIELD GOLD;  Surgeon: [name], MD;  Location: [place];  Service: Vascular;  Laterality: Bilateral;  . APPENDECTOMY    . CARDIAC CATHETERIZATION     Stent x1  . CARDIAC DEFIBRILLATOR PLACEMENT     St. Jude  . CHOLECYSTECTOMY  12/02/02  . CORONARY ANGIOPLASTY    . FRACTURE SURGERY     Left leg compound fracture  . HERNIA REPAIR    . INSERT / REPLACE / REMOVE PACEMAKER    . PTCA     stent of the RCA 07/24/00     Current Outpatient Medications  Medication Sig Dispense Refill  . aspirin EC 81 MG tablet Take 1 tablet (81 mg total) by mouth daily. 90 tablet 3  . atorvastatin (LIPITOR) 80 MG tablet Take 1 tablet (80 mg total) by mouth daily. 90 tablet 3  . magic mouthwash SOLN Take 5 mLs by mouth 4 (four) times daily as needed (for canker sore/mouth pain.).     [name] metoprolol succinate (TOPROL-XL) 50 MG 24 hr tablet Take 25 mg by mouth every evening. Take with or immediately following a meal.     . nitroGLYCERIN (NITROSTAT) 0.4 MG SL tablet Place 1 tablet (0.4 mg total) under the tongue every 5 (five) minutes as needed for chest pain. May repeat 3 times. 25 tablet 3  . warfarin (COUMADIN) 5 MG tablet Take 5 mg by mouth daily. As directed     No current facility-administered medications for this visit.     Allergies:   Patient has no known allergies.    Social History:  The patient  reports that he quit smoking about 12 months ago. He has a 10.00 pack-year smoking history. He has never used smokeless tobacco. He reports that he does not drink alcohol or use drugs.   Family History:  The patient's family history includes Coronary artery disease in his unknown relative; Dementia in his mother; Heart disease in his father.    ROS:  Please see the history of present illness.   Otherwise, review of systems are positive for none.   All other systems are reviewed and negative.    PHYSICAL  EXAM: VS:  BP 102/64   Pulse 75   Ht 6\' 1"  (1.854 m)   Wt 136 lb 9.6 oz (62 kg)   BMI 18.02 kg/m  , BMI Body mass index is 18.02 kg/m. GEN: Well nourished, well developed, in no acute distress  HEENT: normal  Neck: no JVD, carotid bruits, or masses Cardiac: RRR; no murmurs, rubs, or gallops,no edema  Respiratory:  clear to auscultation bilaterally, normal work of breathing GI: soft, nontender, nondistended, + BS MS: no deformity or atrophy  Skin: warm and dry, no rash Neuro:  Strength and sensation are intact Psych: euthymic mood, full affect Vascular: Distal pulses are not palpable.  He has dependent rubor on the right side.  No ulceration.  EKG:  EKG is not ordered today.    Recent Labs: No results found for requested labs within last 8760 hours.    Lipid  Panel    Component Value Date/Time   CHOL 148 02/17/2012 1032   TRIG 140.0 02/17/2012 1032   HDL 33.80 (L) 02/17/2012 1032   CHOLHDL 4 02/17/2012 1032   VLDL 28.0 02/17/2012 1032   LDLCALC 86 02/17/2012 1032      Wt Readings from Last 3 Encounters:  11/10/17 136 lb 9.6 oz (62 kg)  08/14/17 137 lb (62.1 kg)  07/21/17 140 lb 3.2 oz (63.6 kg)       No flowsheet data found.    ASSESSMENT AND PLAN:  1.  Peripheral arterial disease with previous critical limb ischemia affecting the right lower extremity.  Status post aortobifemoral bypass last year with common femoral artery endarterectomy.   He continues to have severe right calf claudication with some rest pain but no lower extremity ulceration.  The plan is to proceed with angiography and possible endovascular intervention once he is done with immune therapy for his cancer.  I will have him come back in 2 months from now.   He will likely require angiography via the left brachial artery.  2. Hyperlipidemia: Continue treatment with atorvastatin with a target LDL of less than 70.  3.  Chronic systolic heart failure: He appears to be euvolemic  4.  Coronary  artery disease: Currently with no anginal symptoms.  5.  Paroxysmal atrial fibrillation: On long-term anticoagulation with warfarin.    Disposition:   FU with me in 2 months  Signed,  Kathlyn Sacramento, MD  11/11/2017 6:27 PM    [place]

## 2017-11-10 NOTE — Progress Notes (Deleted)
HPI: FU CM, CAD (previous PCI of RCA; LIMA to LAD) and prior CVA. He was admitted with a stroke with symptoms of confusion and ataxia in Sept of 2011. He had a TEE which showed an ejection fraction of 30-35% with no evident thrombus. 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 His MRI showed multiple posterior circulation infarct. Patient had an ICD placed February 2012.Also with PAF and prior repair of AAA. In November 2018 patient underwent tracheotomy, hemiglossectomy and tongue base resection, left selective neck dissection and left radial forearm free flap tongue reconstruction for tongue cancer.  Patient had PEG placement followed by atrial fibrillation and myocardial infarction.  Echocardiogram November 2018 showed ejection fraction 20-25%.  Cardiac catheterization November 2018 showed 80% left main, 65% first diagonal.  In December 2018 patient underwent off pump coronary artery bypass and graft with LIMA to the LAD (pt felt to be too high risk for sternotomy).  Followed by [name] for peripheral vascular disease.  He has right calf claudication and angiography is planned for the future but it was felt his nutritional status needed to improve first.    Echocardiogram repeated July 2019 and showed ejection fraction 30 to 35%, moderate left atrial enlargement, moderate right atrial enlargement, mild right ventricular enlargement and mild tricuspid regurgitation.  Since last seen,   Current Outpatient Medications  Medication Sig Dispense Refill  . amitriptyline (ELAVIL) 25 MG tablet Take 25 mg by mouth at bedtime as needed for sleep.     [name] aspirin EC 81 MG tablet Take 1 tablet (81 mg total) by mouth daily. 90 tablet 3  . atorvastatin (LIPITOR) 80 MG tablet Take 1 tablet (80 mg total) by mouth daily. 90 tablet 3  . magic mouthwash SOLN Take 5 mLs by mouth 4 (four) times daily as needed (for canker sore/mouth pain.).     [name] metoprolol  succinate (TOPROL-XL) 50 MG 24 hr tablet Take 25 mg by mouth every evening. Take with or immediately following a meal.     . nitroGLYCERIN (NITROSTAT) 0.4 MG SL tablet Place 1 tablet (0.4 mg total) under the tongue every 5 (five) minutes as needed for chest pain. May repeat 3 times. 25 tablet 3  . warfarin (COUMADIN) 5 MG tablet Take 5 mg by mouth daily. As directed     No current facility-administered medications for this visit.      Past Medical History:  Diagnosis Date  . AAA (abdominal aortic aneurysm) ([place])   . AICD (automatic cardioverter/defibrillator) present   . CAD (coronary artery disease)    status post diaphragmatic wall infarction in 1995, treated with percutaneous transluminal coronary  angioplasty with subsequent stenting of the right coronary artery in 2002.   [name] CAD in native artery    Status post cardiac catheterization, 01/07/2017 in the setting of myocardial infarction.  90% proximal LAD, patent stent to the RCA, with nonobstructive disease elsewhere.   . CHF (congestive heart failure) ([place])   . GERD (gastroesophageal reflux disease)   . Hx of CABG 01/23/2017   LIMA to [facility]  . Hyperlipidemia   . Hypertension    Intolerant to MULTIPLE CHOLESTEROL MEDICATIONS.  [name] Myocardial infarction ([place])   . Neuromuscular disorder ([place])   . PAD (peripheral artery disease) ([facility])    Bilateral Illiac arteries with Right SFA occlusion.   [name] PFO (patent foramen ovale)   . Pneumonia   .  Presence of permanent cardiac pacemaker   . Sleep apnea   . Status post cholecystectomy   . Stroke [facility])     Past Surgical History:  Procedure Laterality Date  . ABDOMINAL AORTOGRAM W/LOWER EXTREMITY N/A 09/17/2016   Procedure: Abdominal Aortogram w/Lower Extremity;  Surgeon: [name], MD;  Location: [place] CV LAB;  Service: Cardiovascular;  Laterality: N/A;  . ANKLE SURGERY    . AORTA - BILATERAL FEMORAL ARTERY BYPASS GRAFT Bilateral 10/23/2016   Procedure:  AORTA BIFEMORAL BYPASS GRAFT USING HEMASHIELD GOLD;  Surgeon: [name], MD;  Location: [place];  Service: Vascular;  Laterality: Bilateral;  . APPENDECTOMY    . CARDIAC CATHETERIZATION     Stent x1  . CARDIAC DEFIBRILLATOR PLACEMENT     St. Jude  . CHOLECYSTECTOMY  12/02/02  . CORONARY ANGIOPLASTY    . FRACTURE SURGERY     Left leg compound fracture  . HERNIA REPAIR    . INSERT / REPLACE / REMOVE PACEMAKER    . PTCA     stent of the RCA 07/24/00    Social History   Socioeconomic History  . Marital status: Married    Spouse name: Not on file  . Number of children: Not on file  . Years of education: Not on file  . Highest education level: Not on file  Occupational History  . Not on file  Social Needs  . Financial resource strain: Not on file  . Food insecurity:    Worry: Not on file    Inability: Not on file  . Transportation needs:    Medical: Not on file    Non-medical: Not on file  Tobacco Use  . Smoking status: Former Smoker    Packs/day: 0.25    Years: 40.00    Pack years: 10.00    Last attempt to quit: 10/22/2016    Years since quitting: 1.0  . Smokeless tobacco: Never Used  Substance and Sexual Activity  . Alcohol use: No  . Drug use: No  . Sexual activity: Not on file  Lifestyle  . Physical activity:    Days per week: Not on file    Minutes per session: Not on file  . Stress: Not on file  Relationships  . Social connections:    Talks on phone: Not on file    Gets together: Not on file    Attends religious service: Not on file    Active member of club or organization: Not on file    Attends meetings of clubs or organizations: Not on file    Relationship status: Not on file  . Intimate partner violence:    Fear of current or ex partner: Not on file    Emotionally abused: Not on file    Physically abused: Not on file    Forced sexual activity: Not on file  Other Topics Concern  . Not on file  Social History Narrative  . Not on file     Family History  Problem Relation Age of Onset  . Coronary artery disease Unknown   . Dementia Mother        on hospice as of 05/12/2016  . Heart disease Father     ROS: no fevers or chills, productive cough, hemoptysis, dysphasia, odynophagia, melena, hematochezia, dysuria, hematuria, rash, seizure activity, orthopnea, PND, pedal edema, claudication. Remaining systems are negative.  Physical Exam: Well-developed well-nourished in no acute distress.  Skin is warm and dry.  HEENT is normal.  Neck  is supple.  Chest is clear to auscultation with normal expansion.  Cardiovascular exam is regular rate and rhythm.  Abdominal exam nontender or distended. No masses palpated. Extremities show no edema. neuro grossly intact  ECG- personally reviewed  A/P  1  Kirk Ruths, MD

## 2017-11-16 ENCOUNTER — Ambulatory Visit: Payer: Medicare Other | Admitting: Cardiology

## 2017-11-25 ENCOUNTER — Ambulatory Visit (INDEPENDENT_AMBULATORY_CARE_PROVIDER_SITE_OTHER): Payer: Medicare Other | Admitting: *Deleted

## 2017-11-25 DIAGNOSIS — I428 Other cardiomyopathies: Secondary | ICD-10-CM | POA: Diagnosis not present

## 2017-11-25 DIAGNOSIS — I429 Cardiomyopathy, unspecified: Secondary | ICD-10-CM

## 2017-11-25 DIAGNOSIS — I5022 Chronic systolic (congestive) heart failure: Secondary | ICD-10-CM

## 2017-11-25 NOTE — Progress Notes (Signed)
Remote ICD transmission.   

## 2017-11-27 ENCOUNTER — Encounter: Payer: Self-pay | Admitting: Cardiology

## 2017-11-30 NOTE — Progress Notes (Deleted)
HPI: FU CM, CAD (previous PCI of RCA; LIMA to LAD) and prior CVA. He was admitted with a stroke with symptoms of confusion and ataxia in Sept of 2011. He had a TEE which showed an ejection fraction of 30-35% with no evident thrombus. 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 His MRI showed multiple posterior circulation infarct. Patient had an ICD placed February 2012.Also with PAF and prior repair of AAA. In November 2018 patient underwent tracheotomy, hemiglossectomy and tongue base resection, left selective neck dissection and left radial forearm free flap tongue reconstruction for tongue cancer.  Patient had PEG placement followed by atrial fibrillation and myocardial infarction.  Echocardiogram November 2018 showed ejection fraction 20-25%.  Cardiac catheterization November 2018 showed 80% left main, 65% first diagonal.  In December 2018 patient underwent off pump coronary artery bypass and graft with LIMA to the LAD (pt felt to be too high risk for sternotomy).  Followed by [name] for peripheral vascular disease.  He has right calf claudication and angiography is planned for the future but it was felt he needed to complete immune therapy for his cancer prior to proceeding. Echocardiogram repeated July 2019 and showed ejection fraction 30 to 35%, moderate left atrial enlargement and right atrial enlargement, mild right ventricular enlargement and mild tricuspid regurgitation. Since last seen,   Current Outpatient Medications  Medication Sig Dispense Refill  . aspirin EC 81 MG tablet Take 1 tablet (81 mg total) by mouth daily. 90 tablet 3  . atorvastatin (LIPITOR) 80 MG tablet Take 1 tablet (80 mg total) by mouth daily. 90 tablet 3  . magic mouthwash SOLN Take 5 mLs by mouth 4 (four) times daily as needed (for canker sore/mouth pain.).     [name] metoprolol succinate (TOPROL-XL) 50 MG 24 hr tablet Take 25 mg by mouth every evening. Take with or  immediately following a meal.     . nitroGLYCERIN (NITROSTAT) 0.4 MG SL tablet Place 1 tablet (0.4 mg total) under the tongue every 5 (five) minutes as needed for chest pain. May repeat 3 times. 25 tablet 3  . warfarin (COUMADIN) 5 MG tablet Take 5 mg by mouth daily. As directed     No current facility-administered medications for this visit.      Past Medical History:  Diagnosis Date  . AAA (abdominal aortic aneurysm) ([place])   . AICD (automatic cardioverter/defibrillator) present   . CAD (coronary artery disease)    status post diaphragmatic wall infarction in 1995, treated with percutaneous transluminal coronary  angioplasty with subsequent stenting of the right coronary artery in 2002.   [name] CAD in native artery    Status post cardiac catheterization, 01/07/2017 in the setting of myocardial infarction.  90% proximal LAD, patent stent to the RCA, with nonobstructive disease elsewhere.   . CHF (congestive heart failure) ([place])   . GERD (gastroesophageal reflux disease)   . Hx of CABG 01/23/2017   LIMA to [facility]  . Hyperlipidemia   . Hypertension    Intolerant to MULTIPLE CHOLESTEROL MEDICATIONS.  [name] Myocardial infarction ([place])   . Neuromuscular disorder ([place])   . PAD (peripheral artery disease) ([facility])    Bilateral Illiac arteries with Right SFA occlusion.   [name] PFO (patent foramen ovale)   . Pneumonia   . Presence of permanent cardiac pacemaker   . Sleep apnea   . Status post cholecystectomy   . Stroke (  [facility])     Past Surgical History:  Procedure Laterality Date  . ABDOMINAL AORTOGRAM W/LOWER EXTREMITY N/A 09/17/2016   Procedure: Abdominal Aortogram w/Lower Extremity;  Surgeon: [name], MD;  Location: [place] [facility];  Service: Cardiovascular;  Laterality: N/A;  . ANKLE SURGERY    . AORTA - BILATERAL FEMORAL ARTERY BYPASS GRAFT Bilateral 10/23/2016   Procedure: AORTA BIFEMORAL BYPASS GRAFT USING HEMASHIELD GOLD;  Surgeon: [name],  MD;  Location: [place];  Service: Vascular;  Laterality: Bilateral;  . APPENDECTOMY    . CARDIAC CATHETERIZATION     Stent x1  . CARDIAC DEFIBRILLATOR PLACEMENT     [employer]  . CHOLECYSTECTOMY  12/02/02  . CORONARY ANGIOPLASTY    . FRACTURE SURGERY     Left leg compound fracture  . HERNIA REPAIR    . INSERT / REPLACE / REMOVE PACEMAKER    . PTCA     stent of the RCA 07/24/00    Social History   Socioeconomic History  . Marital status: Married    Spouse name: Not on file  . Number of children: Not on file  . Years of education: Not on file  . Highest education level: Not on file  Occupational History  . Not on file  Social Needs  . Financial resource strain: Not on file  . Food insecurity:    Worry: Not on file    Inability: Not on file  . Transportation needs:    Medical: Not on file    Non-medical: Not on file  Tobacco Use  . Smoking status: Former Smoker    Packs/day: 0.25    Years: 40.00    Pack years: 10.00    Last attempt to quit: 10/22/2016    Years since quitting: 1.1  . Smokeless tobacco: Never Used  Substance and Sexual Activity  . Alcohol use: No  . Drug use: No  . Sexual activity: Not on file  Lifestyle  . Physical activity:    Days per week: Not on file    Minutes per session: Not on file  . Stress: Not on file  Relationships  . Social connections:    Talks on phone: Not on file    Gets together: Not on file    Attends religious service: Not on file    Active member of club or organization: Not on file    Attends meetings of clubs or organizations: Not on file    Relationship status: Not on file  . Intimate partner violence:    Fear of current or ex partner: Not on file    Emotionally abused: Not on file    Physically abused: Not on file    Forced sexual activity: Not on file  Other Topics Concern  . Not on file  Social History Narrative  . Not on file    Family History  Problem Relation Age of Onset  . Coronary artery disease Unknown     . Dementia Mother        on hospice as of 05/12/2016  . Heart disease Father     ROS: no fevers or chills, productive cough, hemoptysis, dysphasia, odynophagia, melena, hematochezia, dysuria, hematuria, rash, seizure activity, orthopnea, PND, pedal edema, claudication. Remaining systems are negative.  Physical Exam: Well-developed well-nourished in no acute distress.  Skin is warm and dry.  HEENT is normal.  Neck is supple.  Chest is clear to auscultation with normal expansion.  Cardiovascular exam is regular rate and rhythm.  Abdominal exam nontender or distended. No masses palpated. Extremities show no edema. neuro grossly intact  ECG- personally reviewed  A/P  1  Kirk Ruths, MD

## 2017-12-07 ENCOUNTER — Ambulatory Visit: Payer: Medicare Other | Admitting: Cardiology

## 2017-12-10 ENCOUNTER — Ambulatory Visit: Payer: Medicare Other | Admitting: Podiatry

## 2017-12-28 ENCOUNTER — Ambulatory Visit (INDEPENDENT_AMBULATORY_CARE_PROVIDER_SITE_OTHER): Payer: Medicare Other | Admitting: Podiatry

## 2017-12-28 ENCOUNTER — Encounter: Payer: Self-pay | Admitting: Podiatry

## 2017-12-28 DIAGNOSIS — M79676 Pain in unspecified toe(s): Secondary | ICD-10-CM

## 2017-12-28 DIAGNOSIS — D689 Coagulation defect, unspecified: Secondary | ICD-10-CM | POA: Diagnosis not present

## 2017-12-28 DIAGNOSIS — B351 Tinea unguium: Secondary | ICD-10-CM

## 2017-12-29 NOTE — Progress Notes (Signed)
Subjective:   Patient ID: [name], male   DOB: 62 y.o.   MRN: [id number]   HPI Patient presents with painful nailbeds 1-5 both feet that he cannot cut at there is thick.  It grow to the quarters   ROS      Objective:  Physical Exam  Thick yellow brittle nailbeds 1-5 both feet with mycotic component that are painful     Assessment:  Chronic mycotic nail infection 1-5 both feet     Plan:  Debride painful nailbeds 1-5 both feet with no iatrogenic bleeding noted

## 2018-01-12 ENCOUNTER — Ambulatory Visit: Payer: Medicare Other | Admitting: Cardiovascular Disease

## 2018-01-30 LAB — CUP PACEART REMOTE DEVICE CHECK
Battery Remaining Longevity: 40 mo
Battery Remaining Percentage: 35 %
Brady Statistic RV Percent Paced: 1 %
Date Time Interrogation Session: 20191016063403
HighPow Impedance: 41 Ohm
Implantable Lead Implant Date: 20120220
Implantable Lead Location: 753860
Lead Channel Impedance Value: 300 Ohm
Lead Channel Pacing Threshold Amplitude: 0.5 V
Lead Channel Sensing Intrinsic Amplitude: 7.6 mV
Lead Channel Setting Pacing Amplitude: 2.5 V
Lead Channel Setting Pacing Pulse Width: 0.5 ms
MDC IDC LEAD SERIAL: 346356
MDC IDC MSMT BATTERY VOLTAGE: 2.9 V
MDC IDC MSMT LEADCHNL RV PACING THRESHOLD PULSEWIDTH: 0.5 ms
MDC IDC PG IMPLANT DT: 20120220
MDC IDC SET LEADCHNL RV SENSING SENSITIVITY: 0.5 mV
Pulse Gen Serial Number: 783786

## 2018-02-24 ENCOUNTER — Ambulatory Visit (INDEPENDENT_AMBULATORY_CARE_PROVIDER_SITE_OTHER): Payer: Medicare Other

## 2018-02-24 DIAGNOSIS — I429 Cardiomyopathy, unspecified: Secondary | ICD-10-CM

## 2018-02-24 DIAGNOSIS — I5022 Chronic systolic (congestive) heart failure: Secondary | ICD-10-CM

## 2018-02-24 DIAGNOSIS — I428 Other cardiomyopathies: Secondary | ICD-10-CM

## 2018-02-25 NOTE — Progress Notes (Signed)
Remote ICD transmission.   

## 2018-02-28 LAB — CUP PACEART REMOTE DEVICE CHECK
Battery Remaining Longevity: 37 mo
Battery Voltage: 2.9 V
HIGH POWER IMPEDANCE MEASURED VALUE: 41 Ohm
Implantable Lead Model: 185
Implantable Lead Serial Number: 346356
Lead Channel Pacing Threshold Amplitude: 0.5 V
Lead Channel Sensing Intrinsic Amplitude: 8.2 mV
Lead Channel Setting Pacing Amplitude: 2.5 V
Lead Channel Setting Pacing Pulse Width: 0.5 ms
Lead Channel Setting Sensing Sensitivity: 0.5 mV
MDC IDC LEAD IMPLANT DT: 20120220
MDC IDC LEAD LOCATION: 753860
MDC IDC MSMT BATTERY REMAINING PERCENTAGE: 33 %
MDC IDC MSMT LEADCHNL RV IMPEDANCE VALUE: 290 Ohm
MDC IDC MSMT LEADCHNL RV PACING THRESHOLD PULSEWIDTH: 0.5 ms
MDC IDC PG IMPLANT DT: 20120220
MDC IDC PG SERIAL: 783786
MDC IDC SESS DTM: 20200115110556
MDC IDC STAT BRADY RV PERCENT PACED: 1 %

## 2018-05-26 ENCOUNTER — Ambulatory Visit (INDEPENDENT_AMBULATORY_CARE_PROVIDER_SITE_OTHER): Payer: Medicare Other | Admitting: *Deleted

## 2018-05-26 ENCOUNTER — Other Ambulatory Visit: Payer: Self-pay

## 2018-05-26 DIAGNOSIS — I428 Other cardiomyopathies: Secondary | ICD-10-CM | POA: Diagnosis not present

## 2018-05-26 DIAGNOSIS — I5022 Chronic systolic (congestive) heart failure: Secondary | ICD-10-CM

## 2018-05-26 DIAGNOSIS — I429 Cardiomyopathy, unspecified: Secondary | ICD-10-CM

## 2018-05-27 ENCOUNTER — Telehealth: Payer: Self-pay

## 2018-05-27 LAB — CUP PACEART REMOTE DEVICE CHECK
Battery Remaining Longevity: 37 mo
Battery Remaining Percentage: 33 %
Battery Voltage: 2.87 V
Brady Statistic RV Percent Paced: 1 %
Date Time Interrogation Session: 20200416164759
HighPow Impedance: 39 Ohm
Implantable Lead Implant Date: 20120220
Implantable Lead Location: 753860
Implantable Lead Model: 185
Implantable Lead Serial Number: 346356
Implantable Pulse Generator Implant Date: 20120220
Lead Channel Impedance Value: 300 Ohm
Lead Channel Pacing Threshold Amplitude: 0.5 V
Lead Channel Pacing Threshold Pulse Width: 0.5 ms
Lead Channel Sensing Intrinsic Amplitude: 7 mV
Lead Channel Setting Pacing Amplitude: 2.5 V
Lead Channel Setting Pacing Pulse Width: 0.5 ms
Lead Channel Setting Sensing Sensitivity: 0.5 mV
Pulse Gen Serial Number: 783786

## 2018-05-27 NOTE — Telephone Encounter (Signed)
Spoke with patient to remind of missed remote transmission 

## 2018-06-01 NOTE — Progress Notes (Signed)
Remote ICD transmission.   

## 2018-08-09 ENCOUNTER — Ambulatory Visit (HOSPITAL_COMMUNITY): Payer: Medicare Other

## 2018-08-25 ENCOUNTER — Ambulatory Visit (INDEPENDENT_AMBULATORY_CARE_PROVIDER_SITE_OTHER): Payer: Medicare Other | Admitting: *Deleted

## 2018-08-25 DIAGNOSIS — I428 Other cardiomyopathies: Secondary | ICD-10-CM | POA: Diagnosis not present

## 2018-08-25 DIAGNOSIS — I429 Cardiomyopathy, unspecified: Secondary | ICD-10-CM

## 2018-08-26 ENCOUNTER — Telehealth: Payer: Self-pay

## 2018-08-26 LAB — CUP PACEART REMOTE DEVICE CHECK
Battery Remaining Longevity: 33 mo
Battery Remaining Percentage: 31 %
Brady Statistic RV Percent Paced: 1 %
Date Time Interrogation Session: 20200716100120
HighPow Impedance: 46 Ohm
Implantable Lead Implant Date: 20120220
Implantable Lead Location: 753860
Implantable Lead Model: 185
Implantable Lead Serial Number: 346356
Implantable Pulse Generator Implant Date: 20120220
Lead Channel Impedance Value: 310 Ohm
Lead Channel Sensing Intrinsic Amplitude: 10.8 mV
Lead Channel Setting Pacing Amplitude: 2.5 V
Lead Channel Setting Pacing Pulse Width: 0.5 ms
Lead Channel Setting Sensing Sensitivity: 0.5 mV
Pulse Gen Serial Number: 783786

## 2018-08-26 NOTE — Telephone Encounter (Signed)
Spoke with patient to remind of missed remote transmission 

## 2018-09-03 ENCOUNTER — Encounter: Payer: Self-pay | Admitting: Cardiology

## 2018-09-03 NOTE — Progress Notes (Signed)
Remote ICD transmission.   

## 2018-10-12 DEATH — deceased

## 2018-11-24 ENCOUNTER — Encounter: Payer: Medicare Other | Admitting: *Deleted
